# Patient Record
Sex: Male | Born: 1950 | Race: Black or African American | Hispanic: No | Marital: Single | Smoking: Never smoker
Health system: Southern US, Community
[De-identification: ages and names within clinical notes are randomized; demographics above are authoritative.]

## PROBLEM LIST (undated history)

## (undated) DIAGNOSIS — C61 Malignant neoplasm of prostate: Secondary | ICD-10-CM

## (undated) DIAGNOSIS — I10 Essential (primary) hypertension: Secondary | ICD-10-CM

## (undated) DIAGNOSIS — K219 Gastro-esophageal reflux disease without esophagitis: Secondary | ICD-10-CM

## (undated) DIAGNOSIS — E119 Type 2 diabetes mellitus without complications: Secondary | ICD-10-CM

---

## 1999-01-09 ENCOUNTER — Encounter: Payer: Self-pay | Admitting: Emergency Medicine

## 1999-01-09 ENCOUNTER — Inpatient Hospital Stay (HOSPITAL_COMMUNITY): Admission: EM | Admit: 1999-01-09 | Discharge: 1999-01-11 | Payer: Self-pay | Admitting: Emergency Medicine

## 2007-06-18 ENCOUNTER — Ambulatory Visit (HOSPITAL_COMMUNITY): Admission: RE | Admit: 2007-06-18 | Discharge: 2007-06-18 | Payer: Self-pay | Admitting: Family Medicine

## 2007-08-07 ENCOUNTER — Encounter: Admission: RE | Admit: 2007-08-07 | Discharge: 2007-08-07 | Payer: Self-pay | Admitting: Family Medicine

## 2007-08-07 ENCOUNTER — Encounter (INDEPENDENT_AMBULATORY_CARE_PROVIDER_SITE_OTHER): Payer: Self-pay | Admitting: *Deleted

## 2008-01-18 ENCOUNTER — Encounter (INDEPENDENT_AMBULATORY_CARE_PROVIDER_SITE_OTHER): Payer: Self-pay | Admitting: *Deleted

## 2008-01-18 ENCOUNTER — Encounter: Admission: RE | Admit: 2008-01-18 | Discharge: 2008-01-18 | Payer: Self-pay | Admitting: Family Medicine

## 2008-04-18 ENCOUNTER — Encounter: Admission: RE | Admit: 2008-04-18 | Discharge: 2008-04-18 | Payer: Self-pay | Admitting: Family Medicine

## 2008-04-18 ENCOUNTER — Encounter (INDEPENDENT_AMBULATORY_CARE_PROVIDER_SITE_OTHER): Payer: Self-pay | Admitting: *Deleted

## 2008-05-13 ENCOUNTER — Ambulatory Visit: Payer: Self-pay | Admitting: Internal Medicine

## 2008-05-13 DIAGNOSIS — R1084 Generalized abdominal pain: Secondary | ICD-10-CM

## 2008-05-13 DIAGNOSIS — K625 Hemorrhage of anus and rectum: Secondary | ICD-10-CM | POA: Insufficient documentation

## 2008-05-13 DIAGNOSIS — K5900 Constipation, unspecified: Secondary | ICD-10-CM | POA: Insufficient documentation

## 2008-05-14 ENCOUNTER — Encounter: Payer: Self-pay | Admitting: Internal Medicine

## 2008-05-14 ENCOUNTER — Ambulatory Visit: Payer: Self-pay | Admitting: Internal Medicine

## 2008-05-16 ENCOUNTER — Encounter: Payer: Self-pay | Admitting: Internal Medicine

## 2009-04-27 ENCOUNTER — Ambulatory Visit: Payer: Self-pay | Admitting: Internal Medicine

## 2009-04-27 DIAGNOSIS — Z860100 Personal history of colon polyps, unspecified: Secondary | ICD-10-CM | POA: Insufficient documentation

## 2009-04-29 ENCOUNTER — Ambulatory Visit: Payer: Self-pay | Admitting: Internal Medicine

## 2010-05-04 NOTE — Letter (Signed)
Summary: [facility] Instructions  [facility]  [address] [place], [place] [address]   Phone: [phone]  Fax: [phone]       Troy Johnston    July 19, 1950    MRN: [id number]        Procedure Day /Date:WEDNESDAY, 04/29/09     Arrival Time:3:00 PM     Procedure Time:4:00 PM     Location of Procedure:                    [facility] (4th Floor)                        PREPARATION FOR COLONOSCOPY WITH MOVIPREP   Starting 5 days prior to your procedure NOW do not eat nuts, seeds, popcorn, corn, beans, peas,  salads, or any raw vegetables.  Do not take any fiber supplements (e.g. Metamucil, Citrucel, and Benefiber).  THE DAY BEFORE YOUR PROCEDURE         DATE: 04/28/09  DAY: TUESDAY  1.  Drink clear liquids the entire day-NO SOLID FOOD  2.  Do not drink anything colored red or purple.  Avoid juices with pulp.  No orange juice.  3.  Drink at least 64 oz. (8 glasses) of fluid/clear liquids during the day to prevent dehydration and help the prep work efficiently.  CLEAR LIQUIDS INCLUDE: Water Jello Ice Popsicles Tea (sugar ok, no milk/cream) Powdered fruit flavored drinks Coffee (sugar ok, no milk/cream) Gatorade Juice: apple, white grape, white cranberry  Lemonade Clear bullion, consomm, broth Carbonated beverages (any kind) Strained chicken noodle soup Hard Candy                             4.  In the morning, mix first dose of MoviPrep solution:    Empty 1 Pouch A and 1 Pouch B into the disposable container    Add lukewarm drinking water to the top line of the container. Mix to dissolve    Refrigerate (mixed solution should be used within 24 hrs)  5.  Begin drinking the prep at 5:00 p.m. The MoviPrep container is divided by 4 marks.   Every 15 minutes drink the solution down to the next mark (approximately 8 oz) until the full liter is complete.   6.  Follow completed prep with 16 oz of clear liquid of your choice (Nothing red or  purple).  Continue to drink clear liquids until bedtime.  7.  Before going to bed, mix second dose of MoviPrep solution:    Empty 1 Pouch A and 1 Pouch B into the disposable container    Add lukewarm drinking water to the top line of the container. Mix to dissolve    Refrigerate  THE DAY OF YOUR PROCEDURE      DATE:04/29/09 DAY: WEDNESDAY  Beginning at 11:00 a.m. (5 hours before procedure):         1. Every 15 minutes, drink the solution down to the next mark (approx 8 oz) until the full liter is complete.  2. Follow completed prep with 16 oz. of clear liquid of your choice.    3. You may drink clear liquids until 2:00 PM (2 HOURS BEFORE PROCEDURE).   MEDICATION INSTRUCTIONS  Unless otherwise instructed, you should take regular prescription medications with a small sip of water   as early as possible the morning of your procedure.  Diabetic patients -  see separate instructions.         OTHER INSTRUCTIONS  You will need a responsible adult at least 62 years of age to accompany you and drive you home.   This person must remain in the waiting room during your procedure.  Wear loose fitting clothing that is easily removed.  Leave jewelry and other valuables at home.  However, you may wish to bring a book to read or  an iPod/MP3 player to listen to music as you wait for your procedure to start.  Remove all body piercing jewelry and leave at home.  Total time from sign-in until discharge is approximately 2-3 hours.  You should go home directly after your procedure and rest.  You can resume normal activities the  day after your procedure.  The day of your procedure you should not:   Drive   Make legal decisions   Operate machinery   Drink alcohol   Return to work  You will receive specific instructions about eating, activities and medications before you leave.    The above instructions have been reviewed and explained to me by    _______________________    I fully understand and can verbalize these instructions _____________________________ Date _________

## 2010-05-04 NOTE — Letter (Signed)
Summary: Diabetic Instructions  [facility]  [address] [place], [place] [address]   Phone: [phone]  Fax: [phone]    Troy Johnston [birth year] MRN: [id number]   X ORAL DIABETIC MEDICATION INSTRUCTIONS  The day before your procedure:   Take your diabetic pill as you do normally  The day of your procedure:   Do not take your diabetic pill    We will check your blood sugar levels during the admission process and again in Recovery before discharging you home  ________________________________________________________________________

## 2010-05-04 NOTE — Procedures (Signed)
Summary: Colonoscopy  Patient: Troy Johnston Note: All result statuses are Final unless otherwise noted.  Tests: (1) Colonoscopy (COL)   COL Colonoscopy           DONE     [facility]     [address]     [place], [place]  [address]           COLONOSCOPY PROCEDURE REPORT           PATIENT:  [name], [name]  MR#:  [id number]     BIRTHDATE:  1950-09-10, 58 yrs. old  GENDER:  male           ENDOSCOPIST:  Wilhemina Bonito. Eda Keys, MD     Referred by:  Office           PROCEDURE DATE:  04/29/2009     PROCEDURE:  Surveillance Colonoscopy     ASA CLASS:  Class II     INDICATIONS:  history of pre-cancerous (adenomatous) colon polyps     (LARGE POLYP ON ICV PIECEMEALED 05-2008)           MEDICATIONS:   Fentanyl 75 mcg IV, Versed 8 mg IV           DESCRIPTION OF PROCEDURE:   After the risks benefits and     alternatives of the procedure were thoroughly explained, informed     consent was obtained.  Digital rectal exam was performed and     revealed no abnormalities.   The LB CF-[id number] endoscope     was introduced through the anus and advanced to the cecum, which     was identified by both the appendix and ileocecal valve, without     limitations. TIME TO CECUM = 3:15 MIN.The quality of the prep was     excellent, using MoviPrep.  The instrument was then slowly     withdrawn (TIME = 12:00 MIN) as the colon was fully examined.     <<PROCEDUREIMAGES>>           FINDINGS:  No polyps or cancers were seen.  Mild diverticulosis     was found in the sigmoid colon.  This was otherwise a normal     examination of the colon. THE AREA OF PRIOR POLYPECTOMY WAS CLEAN.     Retroflexed views in the rectum revealed internal hemorrhoids.     The scope was then withdrawn from the patient and the procedure     completed.           COMPLICATIONS:  None           ENDOSCOPIC IMPRESSION:     1) No polyps or cancers     2) Mild diverticulosis in the sigmoid colon     3) Otherwise normal  examination           RECOMMENDATIONS:     1) Follow up colonoscopy in 3 years           ______________________________     Wilhemina Bonito. Eda Keys, MD           CC:  Clyda Greener, MD; The Patient           n.     eSIGNED:   Wilhemina Bonito. Eda Keys at 04/29/2009 05:10 PM           Madelaine Etienne, [id number]  Note: An exclamation mark (!) indicates a result that was not dispersed into the flowsheet. Document Creation Date: 04/29/2009 5:11 PM _______________________________________________________________________  Marland Kitchen  1) Order result status: Final Collection or observation date-time: 04/29/2009 17:02 Requested date-time:  Receipt date-time:  Reported date-time:  Referring Physician:   Ordering Physician: Fransico Setters [phone]) Specimen Source:  Source: Launa Grill Order Number: [id number] Lab site:   Appended Document: Colonoscopy    Clinical Lists Changes  Observations: Added new observation of COLONNXTDUE: 04/2012 (04/29/2009 17:20)

## 2010-05-04 NOTE — Assessment & Plan Note (Signed)
Summary: Constipation and abdominal discomfort   History of Present Illness Visit Type: Follow-up Visit Primary GI MD: Yancey Flemings MD Primary Provider: Clyda Greener, MD Requesting Provider: Clyda Greener, MD Chief Complaint: constipation, uncomfortable abdomen History of Present Illness:   60 year old African American male with diabetes, hypertension, and adenomatous colon polyps. He was evaluated in February 2010 for lower abdominal discomfort, constipation, and rectal bleeding. See that dictation for details. He subsequently underwent complete colonoscopy on May 14, 2008. He was found to have mild diverticulosis and an adenomatous colon polyp on the right posterior portion of the ileocecal valve that was removed piecemeal. Other lesions biopsied or removed when non-adenomatous. He was prescribed MiraLax for constipation and asked to followup in the office in 4-6 weeks. He failed a followup as requested. Today he is quite concerned and worried about ongoing problems with constipation and abdominal bloating discomfort.Marland Kitchen He moves his bowels about once every other day. He also complains of back pain. He tells me that he has been undergoing therapeutic enema sessions which allow him to move his bowels. With these enemas he notices his stools are dark. No blood. His weight has been stable. He tells me that his diabetes has been under fair control. His other chronic medical problems are stable.   GI Review of Systems    Reports abdominal pain and  bloating.     Location of  Abdominal pain: lower abdomen.    Denies acid reflux, belching, chest pain, dysphagia with liquids, dysphagia with solids, heartburn, loss of appetite, nausea, vomiting, vomiting blood, weight loss, and  weight gain.      Reports constipation and  diverticulosis.     Denies anal fissure, black tarry stools, change in bowel habit, diarrhea, fecal incontinence, heme positive stool, hemorrhoids, irritable bowel syndrome, jaundice,  light color stool, liver problems, rectal bleeding, and  rectal pain.    Current Medications (verified): 1)  Diovan 160 Mg Tabs (Valsartan) .... Once Daily 2)  Glipizide 5 Mg Tabs (Glipizide) .... Two Times A Day 3)  Hydrocodone-Acetaminophen 7.5-750 Mg Tabs (Hydrocodone-Acetaminophen) .... As Needed 4)  Metformin Hcl 500 Mg Tabs (Metformin Hcl) .... Two Times A Day 5)  Amlodipine Besylate 10 Mg Tabs (Amlodipine Besylate) .... Once Daily 6)  Clonidine Hcl 0.2 Mg Tabs (Clonidine Hcl) .... Once Daily  Allergies (verified): 1)  ! Penicillin  Past History:  Past Medical History: Reviewed history from 05/13/2008 and no changes required. Diabetes Hypertension  Past Surgical History: Reviewed history from 05/13/2008 and no changes required. Unremarkable  Family History: Reviewed history from 05/13/2008 and no changes required. Family History of Diabetes: Mother, Father, Siblings Family History of Heart Disease: Mother  Social History: Reviewed history from 05/13/2008 and no changes required. Occupation: Transportation System Patient has never smoked.  Alcohol Use - no Illicit Drug Use - no Patient gets regular exercise.  Review of Systems       back pain, anxiety, depression, hearing problems, muscle cramps, insomnia, palpitations. All other systems reviewed and reported as negative  Vital Signs:  Patient profile:   60 year old male Height:      67 inches Weight:      233.50 pounds BMI:     36.70 Pulse rate:   80 / minute Pulse rhythm:   regular BP sitting:   170 / 92  (left arm) Cuff size:   large  Vitals Entered By: June McMurray CMA Duncan Dull) (April 27, 2009 9:46 AM)  Physical Exam  General:  Well developed, well  nourished, no acute distress. Head:  Normocephalic and atraumatic. Eyes:  PERRLA, no icterus. Nose:  No deformity, discharge,  or lesions. Mouth:  No deformity or lesions, Neck:  Supple; no masses or thyromegaly. Lungs:  Clear throughout to  auscultation. Heart:  Regular rate and rhythm; no murmurs, rubs,  or bruits. Abdomen:  Soft, obese,nontender and nondistended. No masses, hepatosplenomegaly or hernias noted. Normal bowel sounds. Rectal:  deferred Msk:  Symmetrical with no gross deformities. Normal posture. Pulses:  Normal pulses noted. Extremities:  No clubbing, cyanosis, edema or deformities noted. Neurologic:  Alert and  oriented x4;  grossly normal neurologically. Skin:  Intact without significant lesions or rashes. Psych:  Alert and cooperative. anxious.   Impression & Recommendations:  Problem # 1:  CONSTIPATION (ICD-564.00) chronic functional constipation.. I would not recommend ongoing hydraulic enema sessions  Plan: #1. GlycoLax 17 g in 8 ounces of water. Titrate to need to achieve one bowel movement daily  Problem # 2:  PERSONAL HX COLONIC POLYPS (ICD-V12.72) history of adenomatous colon polyp removed piecemeal. Due for followup.  Plan: #1. Colonoscopy. The nature procedure as well as the risks, benefits, and alternatives were reviewed. He understood and agreed to proceed. #2. Movi prep prescribed. The patient instructed on its use.  Problem # 3:  ABDOMINAL PAIN-GENERALIZED (ICD-789.07) chronic abdominal discomfort with bloating related to constipation. We will try to regulate bowels to see if this affords relief.  Other Orders: Colonoscopy (Colon)  Patient Instructions: 1)  Colonoscopy [facility] 04/29/09 4:00 pm 2)  Movi prep instructions given to patient. 3)  Movi prep Rx. sent to pharmacy. 4)  Hold oral diabetic meds morning of procedure. 5)  The medication list was reviewed and reconciled.  All changed / newly prescribed medications were explained.  A complete medication list was provided to the patient / caregiver. 6)  Copy: Dr. Leanor Kail Prescriptions: MOVIPREP 100 GM  SOLR (PEG-KCL-NACL-NASULF-NA ASC-C) As per prep instructions.  #1 x 0   Entered by:   Milford Cage NCMA   Authorized by:   Hilarie Fredrickson MD   Signed by:   Milford Cage NCMA on 04/27/2009   Method used:   Electronically to        [facility] [phone]* (retail)       [address]       [place], [place]  [address]       Ph: [phone]       Fax: [phone]   RxID:   [id number]   Appended Document: Constipation and abdominal discomfort ADDENDUM: Additional problem of diabetes to be added to the problem list above.. Diabetic medications need to be managed for his procedure. He was instructed to hold diabetic medications the day of the procedure in order to avoid unwanted hypoglycemia. We will monitor his blood sugar medially before and after the procedure. He can resume his diabetes medications after the procedure when he is resuming normal  oral intake

## 2010-07-20 LAB — GLUCOSE, CAPILLARY
Glucose-Capillary: 115 mg/dL — ABNORMAL HIGH (ref 70–99)
Glucose-Capillary: 123 mg/dL — ABNORMAL HIGH (ref 70–99)

## 2010-08-20 NOTE — Discharge Summary (Signed)
. [facility]  Patient:    Troy Johnston                      MRN: [id number] Adm. Date:  [id number] Disc. Date: 01/11/99 Attending:  Colon Branch Dictator:   Leonides Cave, P.A. CC:         Noralyn Pick. Eden Emms, M.D. [facility]                           Discharge Summary  DATE OF BIRTH:  [birth year].  ADMIT DIAGNOSIS:  Chest pain, likely to be due to pericarditis.  Patient had a cardiac catheterization which is reportedly revealing normal coronary anatomy, though that cath report is not available at this time of discharge.  Please see  Dr. Ricki Miller dictated report for full details.  Patient had a negative spiral CT  and a normal 2-D echo.  BRIEF HISTORY:  Patient is a 60 year old African-American male who presented to the Emergency Department with a chest pain that started earlier on the morning of January 09, 1999.  Patient states that it initially began at rest.  Patient denies any nausea, vomiting, or diaphoresis.  Patient has noticed some increased shortness of breath throughout the day.  Patient works as a Naval architect.  PAST MEDICAL HISTORY:  Patient has a very benign past medical history and said e has never visited a doctor in the past.  FAMILY HISTORY:  Patient does have a sister who has had coronary artery bypass graft surgery.  SOCIAL HISTORY:  Patient does not smoke, does not have a history of hypertension or hypercholesterolemia.  LABORATORY:  On EKG in the Emergency Department, patient did have positive ST elevations in lead I, avL, and V2-V4, and T-wave inversions in leads III and avF.  HOSPITAL COURSE:  With this, patient was admitted to rule out MI and started on  heparin, IV Integrilin, and nitroglycerin as well as morphine.  Initial cardiac  enzyme CKs were positive, 844 and 731 respectively, although CK-MBs were negative x 2.  Patient was taken to the cath lab on January 11, 1999.  Please see  the dictated report for full details.  Patient also had a spiral CT to rule out PE, which was negative.  Patient had a 2-D echo done on January 11, 1999 before going home, which revealed normal wall motion and normal ejection fraction.  Patient was treated ith Indocin.  It was felt he was stable for discharge on the evening of October 9, 000 on the following medications:  DISCHARGE MEDICATIONS: 1. Coated aspirin q.d. 2. Motrin 600 mg t.i.d. for four days.  ACTIVITY:  Patient was told that he could return to work on Thursday, October 12. He was told to undergo no heavy lifting, driving, or sexual activity for two days.  DIET:  He was told to adhere to a low fat, low cholesterol diet.  FOLLOW-UP:  He was told if there was bleeding or swelling at the cath site to call [facility] immediately.  Patient was told to follow up with the [facility]  primary care team in 4-6 weeks and to call this week to make that appointment. A number was provided.  LABORATORY:  January 09, 1999, patients sodium was 136, potassium 3.4, chloride 102, CO2 30, glucose 103, BUN 15, creatinine 1.2, INR 1.0.  White count 8.3, hemoglobin 12.6, hematocrit 38.9, platelet count 270.  Of note patient also had  an ESR and ANA, which were perfectly normal.  As mentioned above, patient will follow up with the [facility], as he has no primary care physician. DD:  01/11/99 TD:  01/11/99 Job: [id number]

## 2010-11-26 ENCOUNTER — Other Ambulatory Visit: Payer: Self-pay | Admitting: Gastroenterology

## 2010-12-27 LAB — COMPREHENSIVE METABOLIC PANEL
ALT: 18
AST: 24
Albumin: 3.6
Chloride: 103
Creatinine, Ser: 0.99
GFR calc Af Amer: >60
Potassium: 3.4 — ABNORMAL LOW
Sodium: 139
Total Bilirubin: 0.8

## 2010-12-27 LAB — PSA: PSA: 2.07

## 2010-12-27 LAB — LIPID PANEL
Triglycerides: 74
VLDL: 15

## 2010-12-27 LAB — TSH: TSH: 3.052

## 2010-12-27 LAB — CBC
Platelets: 270
RBC: 4.71
WBC: 6.2

## 2012-05-02 ENCOUNTER — Encounter: Payer: Self-pay | Admitting: Internal Medicine

## 2012-10-30 ENCOUNTER — Encounter: Payer: Self-pay | Admitting: Internal Medicine

## 2013-10-14 ENCOUNTER — Emergency Department (HOSPITAL_COMMUNITY): Payer: Non-veteran care

## 2013-10-14 ENCOUNTER — Emergency Department (HOSPITAL_COMMUNITY)
Admission: EM | Admit: 2013-10-14 | Discharge: 2013-10-14 | Disposition: A | Discharge status: Home / Self Care | Payer: Non-veteran care | Attending: Emergency Medicine | Admitting: Emergency Medicine

## 2013-10-14 ENCOUNTER — Encounter (HOSPITAL_COMMUNITY): Payer: Self-pay | Admitting: Emergency Medicine

## 2013-10-14 DIAGNOSIS — Z8546 Personal history of malignant neoplasm of prostate: Secondary | ICD-10-CM | POA: Insufficient documentation

## 2013-10-14 DIAGNOSIS — N501 Vascular disorders of male genital organs: Secondary | ICD-10-CM | POA: Insufficient documentation

## 2013-10-14 DIAGNOSIS — Z880 Allergy status to penicillin: Secondary | ICD-10-CM | POA: Insufficient documentation

## 2013-10-14 DIAGNOSIS — R109 Unspecified abdominal pain: Secondary | ICD-10-CM

## 2013-10-14 DIAGNOSIS — E119 Type 2 diabetes mellitus without complications: Secondary | ICD-10-CM | POA: Insufficient documentation

## 2013-10-14 DIAGNOSIS — Z8719 Personal history of other diseases of the digestive system: Secondary | ICD-10-CM | POA: Insufficient documentation

## 2013-10-14 DIAGNOSIS — I10 Essential (primary) hypertension: Secondary | ICD-10-CM | POA: Insufficient documentation

## 2013-10-14 HISTORY — DX: Malignant neoplasm of prostate: C61

## 2013-10-14 HISTORY — DX: Type 2 diabetes mellitus without complications: E11.9

## 2013-10-14 HISTORY — DX: Gastro-esophageal reflux disease without esophagitis: K21.9

## 2013-10-14 HISTORY — DX: Essential (primary) hypertension: I10

## 2013-10-14 LAB — BASIC METABOLIC PANEL
ANION GAP: 16 — AB (ref 5–15)
BUN: 23 mg/dL (ref 6–23)
CALCIUM: 9.6 mg/dL (ref 8.4–10.5)
CO2: 20 mEq/L (ref 19–32)
Chloride: 105 mEq/L (ref 96–112)
Creatinine, Ser: 1.72 mg/dL — ABNORMAL HIGH (ref 0.50–1.35)
GFR, EST AFRICAN AMERICAN: 47 mL/min — AB (ref >90)
GFR, EST NON AFRICAN AMERICAN: 40 mL/min — AB (ref >90)
GLUCOSE: 70 mg/dL (ref 70–99)
POTASSIUM: 4 meq/L (ref 3.7–5.3)
SODIUM: 141 meq/L (ref 137–147)

## 2013-10-14 LAB — CBC
HCT: 31.9 % — ABNORMAL LOW (ref 39.0–52.0)
HEMOGLOBIN: 10.2 g/dL — AB (ref 13.0–17.0)
MCH: 26.8 pg (ref 26.0–34.0)
MCHC: 32 g/dL (ref 30.0–36.0)
MCV: 83.7 fL (ref 78.0–100.0)
PLATELETS: 276 10*3/uL (ref 150–400)
RBC: 3.81 MIL/uL — ABNORMAL LOW (ref 4.22–5.81)
RDW: 13.5 % (ref 11.5–15.5)
WBC: 5.6 10*3/uL (ref 4.0–10.5)

## 2013-10-14 LAB — URINE MICROSCOPIC-ADD ON

## 2013-10-14 LAB — URINALYSIS, ROUTINE W REFLEX MICROSCOPIC
Bilirubin Urine: NEGATIVE
GLUCOSE, UA: NEGATIVE mg/dL
KETONES UR: NEGATIVE mg/dL
LEUKOCYTES UA: NEGATIVE
Nitrite: NEGATIVE
PH: 5 (ref 5.0–8.0)
Protein, ur: 30 mg/dL — AB
SPECIFIC GRAVITY, URINE: 1.017 (ref 1.005–1.030)
Urobilinogen, UA: 0.2 mg/dL (ref 0.0–1.0)

## 2013-10-14 MED ORDER — IOHEXOL 300 MG/ML  SOLN: 50.0000 mL | Freq: Once | INTRAMUSCULAR | Status: DC | PRN

## 2013-10-14 NOTE — ED Notes (Addendum)
Patient states he has gross amounts of blood after intercourse. He denies pain after these episodes and that it just stops on its own. Patient is seeing a urologist in [place] at the [facility] hospital. Last visit was 3 weeks ago.

## 2013-10-14 NOTE — ED Notes (Signed)
Pt has been dx with prostate cancer in early June.  Pt has had bleeding from penis.  Pt told originally related to biopsy and now related to sexual intercourse.  Pt now having difficulty with holding his bladder.  Pt states pain in rt flank area.

## 2013-10-15 NOTE — ED Provider Notes (Signed)
CSN: [id number]     Arrival date & time 10/14/13  1340 History   First MD Initiated Contact with Patient 10/14/13 1651     Chief Complaint  Patient presents with  . penile bleeding   . Flank Pain    HPI Patient states she has history of prostate cancer and underwent prostatic biopsy approximately one month ago.  He states his been urinating fine but developed some new suprapubic abdominal pain with some radiation towards his right flank.  He also reports 2 new episodes of blood in his ejaculate.  He denies penile pain.  No testicular pain.  Denies nausea vomiting.  Fevers or chills.  No dysuria or urinary frequency.  Symptoms are mild.   Past Medical History  Diagnosis Date  . Prostate cancer   . Diabetes mellitus without complication   . Hypertension   . GERD (gastroesophageal reflux disease)    History reviewed. No pertinent past surgical history. History reviewed. No pertinent family history. History  Substance Use Topics  . Smoking status: Never Smoker   . Smokeless tobacco: Not on file  . Alcohol Use: No    Review of Systems  All other systems reviewed and are negative.     Allergies  Penicillins  Home Medications   Prior to Admission medications   Not on File   BP 168/78  Pulse 58  Temp(Src) 98.2 F (36.8 C) (Oral)  Resp 16  SpO2 100% Physical Exam  Nursing note and vitals reviewed. Constitutional: He is oriented to person, place, and time. He appears well-developed and well-nourished.  HENT:  Head: Normocephalic and atraumatic.  Eyes: EOM are normal.  Neck: Normal range of motion.  Cardiovascular: Normal rate, regular rhythm, normal heart sounds and intact distal pulses.   Pulmonary/Chest: Effort normal and breath sounds normal. No respiratory distress.  Abdominal: Soft. He exhibits no distension. There is no tenderness.  Musculoskeletal: Normal range of motion.  Neurological: He is alert and oriented to person, place, and time.  Skin: Skin is warm  and dry.  Psychiatric: He has a normal mood and affect. Judgment normal.    ED Course  Procedures (including critical care time) Labs Review Labs Reviewed  URINALYSIS, ROUTINE W REFLEX MICROSCOPIC - Abnormal; Notable for the following:    Hgb urine dipstick TRACE (*)    Protein, ur 30 (*)    All other components within normal limits  URINE MICROSCOPIC-ADD ON - Abnormal; Notable for the following:    Casts HYALINE CASTS (*)    All other components within normal limits  CBC - Abnormal; Notable for the following:    RBC 3.81 (*)    Hemoglobin 10.2 (*)    HCT 31.9 (*)    All other components within normal limits  BASIC METABOLIC PANEL - Abnormal; Notable for the following:    Creatinine, Ser 1.72 (*)    GFR calc non Af Amer 40 (*)    GFR calc Af Amer 47 (*)    Anion gap 16 (*)    All other components within normal limits    Imaging Review Ct Abdomen Pelvis Wo Contrast  10/14/2013   CLINICAL DATA:  Right flank pain. Recent diagnosis of prostate cancer.  EXAM: CT ABDOMEN AND PELVIS WITHOUT CONTRAST  TECHNIQUE: Multidetector CT imaging of the abdomen and pelvis was performed following the standard protocol without IV contrast.  COMPARISON:  Prior CTs 04/18/2008 and 01/18/2008.  FINDINGS: The lung bases are clear. There is no pleural or pericardial effusion.  There is a 2 mm calculus in the lower pole of the left kidney on image 41. There is also tiny calculus in the lower pole of the right kidney, best seen on coronal image number 100. There is no evidence of hydronephrosis, ureteral or bladder calculus. Low-density lesion posteriorly in the mid left kidney has enlarged compared with the prior study, measuring 1.7 cm on image 37. No focal left renal abnormalities are seen.  The liver, gallbladder, pancreas, spleen and adrenal glands appear unremarkable.  The stomach, small bowel, appendix and colon demonstrate no acute findings. There is mild aortoiliac atherosclerosis. No enlarged abdominal  pelvic lymph nodes are seen. The prostate gland has a stable appearance without obvious mass. Asymmetric density within the right inguinal canal is similar to the prior study and may reflect a hydrocele or retraction of the right testis.  There are no acute or worrisome osseous findings.  IMPRESSION: 1. Nonobstructing bilateral renal calculi. No evidence of ureteral calculus or hydronephrosis. 2. Interval enlargement of low-density right renal lesion, likely a cyst. 3. No evidence of metastatic prostate cancer. 4. Nonspecific soft tissue prominence in the right inguinal canal, similar to prior studies.   Electronically Signed   By: Camie Patience M.D.   On: 10/14/2013 18:57     EKG Interpretation None      MDM   Final diagnoses:  Flank pain    Patient continues to feel better this time.  CT scan and urinalysis without significant abnormalities.  Patient be referred back to his urologist at the [facility] hospital.    Hoy Morn, MD 10/15/13 [phone]

## 2014-08-14 ENCOUNTER — Encounter: Payer: Self-pay | Admitting: Internal Medicine

## 2017-10-31 ENCOUNTER — Emergency Department (HOSPITAL_COMMUNITY): Payer: Medicare HMO

## 2017-10-31 ENCOUNTER — Emergency Department (HOSPITAL_BASED_OUTPATIENT_CLINIC_OR_DEPARTMENT_OTHER): Payer: Medicare HMO

## 2017-10-31 ENCOUNTER — Emergency Department (HOSPITAL_COMMUNITY)
Admission: EM | Admit: 2017-10-31 | Discharge: 2017-10-31 | Disposition: A | Discharge status: Home / Self Care | Payer: Medicare HMO | Attending: Emergency Medicine | Admitting: Emergency Medicine

## 2017-10-31 ENCOUNTER — Other Ambulatory Visit: Payer: Self-pay

## 2017-10-31 ENCOUNTER — Encounter (HOSPITAL_COMMUNITY): Payer: Self-pay

## 2017-10-31 DIAGNOSIS — M25572 Pain in left ankle and joints of left foot: Secondary | ICD-10-CM | POA: Diagnosis not present

## 2017-10-31 DIAGNOSIS — Z7984 Long term (current) use of oral hypoglycemic drugs: Secondary | ICD-10-CM | POA: Diagnosis not present

## 2017-10-31 DIAGNOSIS — Z8546 Personal history of malignant neoplasm of prostate: Secondary | ICD-10-CM | POA: Insufficient documentation

## 2017-10-31 DIAGNOSIS — E119 Type 2 diabetes mellitus without complications: Secondary | ICD-10-CM | POA: Insufficient documentation

## 2017-10-31 DIAGNOSIS — I10 Essential (primary) hypertension: Secondary | ICD-10-CM | POA: Insufficient documentation

## 2017-10-31 DIAGNOSIS — Z7982 Long term (current) use of aspirin: Secondary | ICD-10-CM | POA: Diagnosis not present

## 2017-10-31 DIAGNOSIS — M25571 Pain in right ankle and joints of right foot: Secondary | ICD-10-CM | POA: Diagnosis not present

## 2017-10-31 DIAGNOSIS — Z79899 Other long term (current) drug therapy: Secondary | ICD-10-CM | POA: Insufficient documentation

## 2017-10-31 DIAGNOSIS — M79609 Pain in unspecified limb: Secondary | ICD-10-CM | POA: Diagnosis not present

## 2017-10-31 DIAGNOSIS — M79662 Pain in left lower leg: Secondary | ICD-10-CM | POA: Diagnosis present

## 2017-10-31 LAB — CBC WITH DIFFERENTIAL/PLATELET
BASOS ABS: 0 10*3/uL (ref 0.0–0.1)
BASOS PCT: 0 %
EOS ABS: 0.1 10*3/uL (ref 0.0–0.7)
Eosinophils Relative: 3 %
HEMATOCRIT: 29.7 % — AB (ref 39.0–52.0)
Hemoglobin: 9.6 g/dL — ABNORMAL LOW (ref 13.0–17.0)
Lymphocytes Relative: 41 %
Lymphs Abs: 2.2 10*3/uL (ref 0.7–4.0)
MCH: 27.8 pg (ref 26.0–34.0)
MCHC: 32.3 g/dL (ref 30.0–36.0)
MCV: 86.1 fL (ref 78.0–100.0)
MONO ABS: 0.3 10*3/uL (ref 0.1–1.0)
Monocytes Relative: 6 %
NEUTROS ABS: 2.8 10*3/uL (ref 1.7–7.7)
NEUTROS PCT: 50 %
PLATELETS: 217 10*3/uL (ref 150–400)
RBC: 3.45 MIL/uL — ABNORMAL LOW (ref 4.22–5.81)
RDW: 13.1 % (ref 11.5–15.5)
WBC: 5.5 10*3/uL (ref 4.0–10.5)

## 2017-10-31 LAB — BASIC METABOLIC PANEL
ANION GAP: 5 (ref 5–15)
BUN: 38 mg/dL — ABNORMAL HIGH (ref 8–23)
CALCIUM: 9.5 mg/dL (ref 8.9–10.3)
CO2: 24 mmol/L (ref 22–32)
CREATININE: 2.26 mg/dL — AB (ref 0.61–1.24)
Chloride: 113 mmol/L — ABNORMAL HIGH (ref 98–111)
GFR calc Af Amer: 33 mL/min — ABNORMAL LOW (ref >60)
GFR, EST NON AFRICAN AMERICAN: 28 mL/min — AB (ref >60)
GLUCOSE: 117 mg/dL — AB (ref 70–99)
Potassium: 3.9 mmol/L (ref 3.5–5.1)
SODIUM: 142 mmol/L (ref 135–145)

## 2017-10-31 MED ORDER — KETOROLAC TROMETHAMINE 30 MG/ML IJ SOLN
15.0000 mg | Freq: Once | INTRAMUSCULAR | Status: AC
  Administered 2017-10-31: 15 mg via INTRAVENOUS
  Filled 2017-10-31: qty 1

## 2017-10-31 MED ORDER — HYDROCODONE-ACETAMINOPHEN 5-325 MG PO TABS: 2.0000 | ORAL_TABLET | ORAL | 0 refills | Status: DC | PRN

## 2017-10-31 NOTE — ED Provider Notes (Signed)
[facility] DEPT Provider Note   CSN: [id number] Arrival date & time: 10/31/17  7673     History   Chief Complaint Chief Complaint  Patient presents with  . Leg Pain    HPI Troy Johnston is a 67 y.o. male.  67 year old male with prior history of tubal presents with complaint of bilateral ankle pain.  Patient reports that his right and left ankles are achy and painful.  Symptoms started yesterday.  Patient denies any specific injury.  He did not take anything for his symptoms at home.  He denies associated fever, weakness, nausea, vomiting, chest pain, shortness of breath, or other complaint.  He reports achy pain that "burns" in both ankles. He is ambulatory.   The history is provided by the patient.  Ankle Pain   The incident occurred yesterday. The incident occurred at home. There was no injury mechanism. The pain is present in the left ankle and right ankle. The quality of the pain is described as aching. The pain is mild. The pain has been constant since onset. Pertinent negatives include no numbness, no inability to bear weight, no loss of motion, no muscle weakness, no loss of sensation and no tingling. He reports no foreign bodies present. Nothing aggravates the symptoms. He has tried nothing for the symptoms.    Past Medical History:  Diagnosis Date  . Diabetes mellitus without complication ([place])   . GERD (gastroesophageal reflux disease)   . Hypertension   . Prostate cancer [facility])     Patient Active Problem List   Diagnosis Date Noted  . PERSONAL HX COLONIC POLYPS 04/27/2009  . CONSTIPATION 05/13/2008  . RECTAL BLEEDING 05/13/2008  . ABDOMINAL PAIN-GENERALIZED 05/13/2008    History reviewed. No pertinent surgical history.      Home Medications    Prior to Admission medications   Medication Sig Start Date End Date Taking? Authorizing Provider  amLODipine (NORVASC) 10 MG tablet Take 10 mg by mouth daily.   Yes [provider]  aspirin EC 81 MG tablet Take 81 mg by mouth daily.   Yes [provider]  atorvastatin (LIPITOR) 20 MG tablet Take 20 mg by mouth at bedtime.   Yes [provider]  doxazosin (CARDURA) 8 MG tablet Take 4 mg by mouth at bedtime.   Yes [provider]  glipiZIDE (GLUCOTROL) 10 MG tablet Take 10 mg by mouth 2 (two) times daily before a meal.   Yes [provider]  hydrALAZINE (APRESOLINE) 100 MG tablet Take 100 mg by mouth every 8 (eight) hours as needed.   Yes [provider]  ibuprofen (ADVIL,MOTRIN) 200 MG tablet Take 400 mg by mouth daily as needed for moderate pain.   Yes [provider]  IRON PO Take 1 tablet by mouth daily.   Yes [provider]  lisinopril (PRINIVIL,ZESTRIL) 40 MG tablet Take 40 mg by mouth daily.   Yes [provider]  metFORMIN (GLUCOPHAGE-XR) 500 MG 24 hr tablet Take 500 mg by mouth daily with breakfast.   Yes [provider]  polyethylene glycol (MIRALAX / GLYCOLAX) packet Take 17 g by mouth daily as needed for mild constipation.   Yes [provider]  PSYLLIUM PO 1 Tablespoon twice Daily as needed for regularity   Yes [provider]  vardenafil (LEVITRA) 20 MG tablet Take 20 mg by mouth daily as needed for erectile dysfunction.   Yes [provider]  HYDROcodone-acetaminophen (NORCO/VICODIN) 5-325 MG tablet Take 2  tablets by mouth every 4 (four) hours as needed. 10/31/17   Valarie Merino, MD    Family History History reviewed. No pertinent family history.  Social History Social History   Tobacco Use  . Smoking status: Never Smoker  Substance Use Topics  . Alcohol use: No  . Drug use: No     Allergies   Penicillins   Review of Systems Review of Systems  Neurological: Negative for tingling and numbness.  All other systems reviewed and are negative.    Physical Exam Updated Vital Signs BP (!) 151/65 (BP Location: Left Arm)    Pulse 76   Temp 98 F (36.7 C) (Oral)   Resp 18   Ht 5\' 7"  (1.702 m)   Wt 95.3 kg (210 lb)   SpO2 100%   BMI 32.89 kg/m   Physical Exam  Constitutional: He is oriented to person, place, and time. He appears well-developed and well-nourished. No distress.  HENT:  Head: Normocephalic and atraumatic.  Mouth/Throat: Oropharynx is clear and moist.  Eyes: Pupils are equal, round, and reactive to light. Conjunctivae and EOM are normal.  Neck: Normal range of motion. Neck supple.  Cardiovascular: Normal rate, regular rhythm and normal heart sounds.  Pulmonary/Chest: Effort normal and breath sounds normal. No respiratory distress.  Abdominal: Soft. He exhibits no distension. There is no tenderness.  Musculoskeletal: Normal range of motion. He exhibits no edema or deformity.  Mild tenderness noted diffusely to bilateral ankles.  No step-off or deformity noted.  No erythema noted.  Distal bilateral lower extremities are neurovascular intact.  Patient is ambulatory.   Neurological: He is alert and oriented to person, place, and time.  Skin: Skin is warm and dry.  Psychiatric: He has a normal mood and affect.  Nursing note and vitals reviewed.    ED Treatments / Results  Labs (all labs ordered are listed, but only abnormal results are displayed) Labs Reviewed  BASIC METABOLIC PANEL - Abnormal; Notable for the following components:      Result Value   Chloride 113 (*)    Glucose, Bld 117 (*)    BUN 38 (*)    Creatinine, Ser 2.26 (*)    GFR calc non Af Amer 28 (*)    GFR calc Af Amer 33 (*)    All other components within normal limits  CBC WITH DIFFERENTIAL/PLATELET - Abnormal; Notable for the following components:   RBC 3.45 (*)    Hemoglobin 9.6 (*)    HCT 29.7 (*)    All other components within normal limits    EKG None  Radiology Dg Ankle Complete Left  Result Date: 10/31/2017 CLINICAL DATA:  Burning sensation in both ankles. No new injury. Remote left ankle fracture.  EXAM: LEFT ANKLE COMPLETE - 3+ VIEW COMPARISON:  None. FINDINGS: Evidence of old healed distal tibia and fibular fractures. Associated advanced osteoarthritic changes within the left ankle with near complete joint space loss. No acute fracture. Vascular calcifications noted. 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 No acute bony abnormality. Electronically Signed   By: Rolm Baptise M.D.   On: 10/31/2017 11:19   Dg Ankle Complete Right  Result Date: 10/31/2017 CLINICAL DATA:  Burning sensation in both ankles.  No injury EXAM: RIGHT ANKLE - COMPLETE 3+ VIEW COMPARISON:  None. FINDINGS: No acute bony abnormality. Specifically, no fracture, subluxation, or dislocation. Vascular calcifications noted. Joint spaces maintained. IMPRESSION: No acute bony abnormality. Electronically Signed   By: Lennette Bihari  [place] M.D.   On: 10/31/2017 11:19    Procedures Procedures (including critical care time)  Medications Ordered in ED Medications  ketorolac (TORADOL) 30 MG/ML injection 15 mg (15 mg Intravenous Given 10/31/17 1139)     Initial Impression / Assessment and Plan / ED Course  I have reviewed the triage vital signs and the nursing notes.  Pertinent labs & imaging results that were available during my care of the patient were reviewed by me and considered in my medical decision making (see chart for details).     MDM  Screen complete  Patient is presenting for evaluation of his bilateral ankle pain.  X-rays do not reveal any significant traumatic injury.  Ultrasound does not show a DVT.  Screening labs are otherwise without significant abnormality except for mildly elevated creatinine.  Patient reports a long-standing history of renal insufficiency that is followed by the [place].  Patient is unsure what his baseline Cr is.  Patient is advised to follow-up closely with the [facility] clinic in [place] for further work-up and evaluation of his kidney  function.  Feels improved following his ED evaluation and treatment.  He desires discharge home.  Close follow-up is advised.  Strict return precautions are given and understood.   Final Clinical Impressions(s) / ED Diagnoses   Final diagnoses:  Acute bilateral ankle pain    ED Discharge Orders        Ordered    HYDROcodone-acetaminophen (NORCO/VICODIN) 5-325 MG tablet  Every 4 hours PRN     10/31/17 1358       Valarie Merino, MD 10/31/17 [phone]

## 2017-10-31 NOTE — Progress Notes (Signed)
*  Preliminary Results* Bilateral lower extremity venous duplex completed. Bilateral lower extremities are negative for deep vein thrombosis. There is no evidence of Baker's cyst bilaterally.  10/31/2017 1:48 PM Mujtaba Bollig, BS, RVT, RDCS

## 2017-10-31 NOTE — ED Triage Notes (Addendum)
Pt presents with c/o bilateral leg pain that started this morning. Pt reports the pain started in his lower legs and has now progressed up to his groin. Pt was having some trouble ambulating in the lobby. Pt reports he recently bought a cane because he was having some trouble ambulating but today the pain is in both legs, as opposed to just one. Pt is also very shaky, reports that his head is hurting, also reporting that he has not had anything to eat this morning.

## 2017-10-31 NOTE — Discharge Instructions (Addendum)
Please return for any problem. Follow up with your regular care providers at the [facility]. Your creatinine today is elevated (2.26) with a BUN of 38. Your kidney function requires a recheck within the next week.

## 2019-11-13 ENCOUNTER — Emergency Department (HOSPITAL_COMMUNITY)
Admission: EM | Admit: 2019-11-13 | Discharge: 2019-11-14 | Disposition: A | Discharge status: Home / Self Care | Payer: No Typology Code available for payment source | Attending: Emergency Medicine | Admitting: Emergency Medicine

## 2019-11-13 ENCOUNTER — Emergency Department (HOSPITAL_COMMUNITY): Payer: No Typology Code available for payment source

## 2019-11-13 ENCOUNTER — Encounter (HOSPITAL_COMMUNITY): Payer: Self-pay | Admitting: *Deleted

## 2019-11-13 DIAGNOSIS — M545 Low back pain: Secondary | ICD-10-CM | POA: Insufficient documentation

## 2019-11-13 DIAGNOSIS — Z7984 Long term (current) use of oral hypoglycemic drugs: Secondary | ICD-10-CM | POA: Insufficient documentation

## 2019-11-13 DIAGNOSIS — R1030 Lower abdominal pain, unspecified: Secondary | ICD-10-CM | POA: Insufficient documentation

## 2019-11-13 DIAGNOSIS — Y9389 Activity, other specified: Secondary | ICD-10-CM | POA: Insufficient documentation

## 2019-11-13 DIAGNOSIS — M542 Cervicalgia: Secondary | ICD-10-CM | POA: Diagnosis present

## 2019-11-13 DIAGNOSIS — Y9289 Other specified places as the place of occurrence of the external cause: Secondary | ICD-10-CM | POA: Insufficient documentation

## 2019-11-13 DIAGNOSIS — Y998 Other external cause status: Secondary | ICD-10-CM | POA: Diagnosis not present

## 2019-11-13 DIAGNOSIS — E119 Type 2 diabetes mellitus without complications: Secondary | ICD-10-CM | POA: Insufficient documentation

## 2019-11-13 DIAGNOSIS — S161XXA Strain of muscle, fascia and tendon at neck level, initial encounter: Secondary | ICD-10-CM | POA: Diagnosis not present

## 2019-11-13 DIAGNOSIS — Z79899 Other long term (current) drug therapy: Secondary | ICD-10-CM | POA: Insufficient documentation

## 2019-11-13 DIAGNOSIS — Z7982 Long term (current) use of aspirin: Secondary | ICD-10-CM | POA: Insufficient documentation

## 2019-11-13 DIAGNOSIS — I10 Essential (primary) hypertension: Secondary | ICD-10-CM | POA: Insufficient documentation

## 2019-11-13 MED ORDER — ACETAMINOPHEN 325 MG PO TABS: 650.0000 mg | ORAL_TABLET | Freq: Once | ORAL | Status: DC
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
## 2019-11-14 ENCOUNTER — Emergency Department (HOSPITAL_COMMUNITY): Payer: No Typology Code available for payment source

## 2019-11-14 LAB — BASIC METABOLIC PANEL
Anion gap: 10 (ref 5–15)
BUN: 35 mg/dL — ABNORMAL HIGH (ref 8–23)
CO2: 22 mmol/L (ref 22–32)
Calcium: 9.1 mg/dL (ref 8.9–10.3)
Chloride: 109 mmol/L (ref 98–111)
Creatinine, Ser: 2.43 mg/dL — ABNORMAL HIGH (ref 0.61–1.24)
GFR calc Af Amer: 30 mL/min — ABNORMAL LOW (ref >60)
GFR calc non Af Amer: 26 mL/min — ABNORMAL LOW (ref >60)
Glucose, Bld: 150 mg/dL — ABNORMAL HIGH (ref 70–99)
Potassium: 4.6 mmol/L (ref 3.5–5.1)
Sodium: 141 mmol/L (ref 135–145)

## 2019-11-14 MED ORDER — METHOCARBAMOL 500 MG PO TABS: 500.0000 mg | ORAL_TABLET | Freq: Two times a day (BID) | ORAL | 0 refills | Status: DC | PRN
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
## 2020-06-13 ENCOUNTER — Emergency Department (HOSPITAL_COMMUNITY): Payer: No Typology Code available for payment source

## 2020-06-13 ENCOUNTER — Encounter (HOSPITAL_COMMUNITY): Payer: Self-pay | Admitting: Emergency Medicine

## 2020-06-13 ENCOUNTER — Emergency Department (HOSPITAL_COMMUNITY)
Admission: EM | Admit: 2020-06-13 | Discharge: 2020-06-14 | Disposition: A | Discharge status: Home / Self Care | Payer: No Typology Code available for payment source | Attending: Emergency Medicine | Admitting: Emergency Medicine

## 2020-06-13 ENCOUNTER — Other Ambulatory Visit: Payer: Self-pay

## 2020-06-13 DIAGNOSIS — K529 Noninfective gastroenteritis and colitis, unspecified: Secondary | ICD-10-CM

## 2020-06-13 DIAGNOSIS — Z20822 Contact with and (suspected) exposure to covid-19: Secondary | ICD-10-CM | POA: Insufficient documentation

## 2020-06-13 DIAGNOSIS — Z79899 Other long term (current) drug therapy: Secondary | ICD-10-CM | POA: Diagnosis not present

## 2020-06-13 DIAGNOSIS — Z7984 Long term (current) use of oral hypoglycemic drugs: Secondary | ICD-10-CM | POA: Insufficient documentation

## 2020-06-13 DIAGNOSIS — I10 Essential (primary) hypertension: Secondary | ICD-10-CM | POA: Diagnosis not present

## 2020-06-13 DIAGNOSIS — R1032 Left lower quadrant pain: Secondary | ICD-10-CM | POA: Diagnosis not present

## 2020-06-13 DIAGNOSIS — R1031 Right lower quadrant pain: Secondary | ICD-10-CM | POA: Diagnosis not present

## 2020-06-13 DIAGNOSIS — K625 Hemorrhage of anus and rectum: Secondary | ICD-10-CM | POA: Insufficient documentation

## 2020-06-13 DIAGNOSIS — K921 Melena: Secondary | ICD-10-CM

## 2020-06-13 DIAGNOSIS — Z8546 Personal history of malignant neoplasm of prostate: Secondary | ICD-10-CM | POA: Insufficient documentation

## 2020-06-13 DIAGNOSIS — E119 Type 2 diabetes mellitus without complications: Secondary | ICD-10-CM | POA: Diagnosis not present

## 2020-06-13 LAB — COMPREHENSIVE METABOLIC PANEL
ALT: 12 U/L (ref 0–44)
AST: 15 U/L (ref 15–41)
Albumin: 3.3 g/dL — ABNORMAL LOW (ref 3.5–5.0)
Alkaline Phosphatase: 44 U/L (ref 38–126)
Anion gap: 7 (ref 5–15)
BUN: 25 mg/dL — ABNORMAL HIGH (ref 8–23)
CO2: 23 mmol/L (ref 22–32)
Calcium: 8.5 mg/dL — ABNORMAL LOW (ref 8.9–10.3)
Chloride: 107 mmol/L (ref 98–111)
Creatinine, Ser: 1.93 mg/dL — ABNORMAL HIGH (ref 0.61–1.24)
GFR, Estimated: 37 mL/min — ABNORMAL LOW (ref >60)
Glucose, Bld: 170 mg/dL — ABNORMAL HIGH (ref 70–99)
Potassium: 3.6 mmol/L (ref 3.5–5.1)
Sodium: 137 mmol/L (ref 135–145)
Total Bilirubin: 0.8 mg/dL (ref 0.3–1.2)
Total Protein: 6.6 g/dL (ref 6.5–8.1)

## 2020-06-13 LAB — CBC
HCT: 31.7 % — ABNORMAL LOW (ref 39.0–52.0)
Hemoglobin: 9.2 g/dL — ABNORMAL LOW (ref 13.0–17.0)
MCH: 26.4 pg (ref 26.0–34.0)
MCHC: 29 g/dL — ABNORMAL LOW (ref 30.0–36.0)
MCV: 91.1 fL (ref 80.0–100.0)
Platelets: 196 10*3/uL (ref 150–400)
RBC: 3.48 MIL/uL — ABNORMAL LOW (ref 4.22–5.81)
RDW: 13.6 % (ref 11.5–15.5)
WBC: 7.4 10*3/uL (ref 4.0–10.5)
nRBC: 0 % (ref 0.0–0.2)

## 2020-06-13 LAB — TYPE AND SCREEN
ABO/RH(D): O POS
Antibody Screen: NEGATIVE

## 2020-06-13 MED ORDER — LACTATED RINGERS IV BOLUS
500.0000 mL | Freq: Once | INTRAVENOUS | Status: AC
  Administered 2020-06-13: 500 mL via INTRAVENOUS

## 2020-06-13 MED ORDER — METRONIDAZOLE IN NACL 5-0.79 MG/ML-% IV SOLN
500.0000 mg | Freq: Once | INTRAVENOUS | Status: AC
  Administered 2020-06-13: 500 mg via INTRAVENOUS
  Filled 2020-06-13: qty 100

## 2020-06-13 MED ORDER — LACTATED RINGERS IV SOLN: INTRAVENOUS | Status: DC

## 2020-06-13 MED ORDER — MORPHINE SULFATE (PF) 4 MG/ML IV SOLN
4.0000 mg | Freq: Once | INTRAVENOUS | Status: AC
  Administered 2020-06-13: 4 mg via INTRAVENOUS
  Filled 2020-06-13: qty 1

## 2020-06-13 MED ORDER — ONDANSETRON HCL 4 MG/2ML IJ SOLN
4.0000 mg | Freq: Once | INTRAMUSCULAR | Status: AC
  Administered 2020-06-13: 4 mg via INTRAVENOUS
  Filled 2020-06-13: qty 2

## 2020-06-13 MED ORDER — CIPROFLOXACIN IN D5W 400 MG/200ML IV SOLN
400.0000 mg | Freq: Once | INTRAVENOUS | Status: AC
  Administered 2020-06-13: 400 mg via INTRAVENOUS
  Filled 2020-06-13: qty 200

## 2020-06-13 NOTE — ED Provider Notes (Signed)
[facility] EMERGENCY DEPARTMENT Provider Note   CSN: [id number] Arrival date & time: 06/13/20  1716     History Chief Complaint  Patient presents with  . Rectal Bleeding  . Abdominal Pain    Troy Johnston is a 70 y.o. male.  HPI    70 year old male with history of diabetes, diverticulosis comes in a chief complaint of rectal bleeding and abdominal pain.  Patient also has prostate cancer history, but he is currently cancer free.  He reports that he woke up in the middle night and had bloody stools.  After his BM, he started having lower quadrant abdominal pain.  Pain has intensified over time and is currently at 8 out of 10.  The pain is located in the lower abdominal quadrants, worse on the left side.  Although patient has not had a large bloody stool, he continues to have blood seeping through his rectum continually throughout the day.  The blood is dark red.  There is no melena.  Patient has no upper quadrant abdominal pain.  He denies any history of heavy smoking, drinking, cardiovascular disease such as stroke, CAD, A. Fib.  No history of GI bleed in the past.  No recent endoscopies.  Patient is not on any blood thinners or antiplatelet agents.  Past Medical History:  Diagnosis Date  . Diabetes mellitus without complication ([place])   . GERD (gastroesophageal reflux disease)   . Hypertension   . Prostate cancer [facility])     Patient Active Problem List   Diagnosis Date Noted  . PERSONAL HX COLONIC POLYPS 04/27/2009  . CONSTIPATION 05/13/2008  . RECTAL BLEEDING 05/13/2008  . ABDOMINAL PAIN-GENERALIZED 05/13/2008    History reviewed. No pertinent surgical history.     No family history on file.  Social History   Tobacco Use  . Smoking status: Never Smoker  Substance Use Topics  . Alcohol use: No  . Drug use: No    Home Medications Prior to Admission medications   Medication Sig Start Date End Date Taking? Authorizing Provider  allopurinol  (ZYLOPRIM) 100 MG tablet Take 50 mg by mouth every morning. For high uric acid level   Yes [provider]  Alogliptin Benzoate 12.5 MG TABS Take 6.25 mg by mouth every morning. For diabetes (replaces metformin)   Yes [provider]  atorvastatin (LIPITOR) 20 MG tablet Take 20 mg by mouth at bedtime. For cholesterol   Yes [provider]  Carboxymethylcellulose Sodium 0.25 % SOLN Place 1 drop into both eyes 3 (three) times daily.   Yes [provider]  Cholecalciferol (VITAMIN D) 50 MCG (2000 UT) tablet Take 4,000 Units by mouth in the morning.   Yes [provider]  ferrous sulfate 325 (65 FE) MG tablet Take 325 mg by mouth See admin instructions. Take one tablet (325 mg) by mouth three days week - Monday, Wednesday, Thursday   Yes [provider]  glipiZIDE (GLUCOTROL) 10 MG tablet Take 10 mg by mouth 2 (two) times daily before a meal. For diabetes   Yes [provider]  hydrALAZINE (APRESOLINE) 100 MG tablet Take 100 mg by mouth every 8 (eight) hours.   Yes [provider]  lisinopril (ZESTRIL) 10 MG tablet Take 10 mg by mouth in the morning.   Yes [provider]    Allergies    Penicillins  Review of Systems   Review of Systems  Constitutional: Positive for activity change.  Respiratory: Negative for shortness of breath.  Gastrointestinal: Positive for abdominal pain and blood in stool.  Neurological: Negative for dizziness.  Hematological: Does not bruise/bleed easily.  All other systems reviewed and are negative.   Physical Exam Updated Vital Signs BP (!) 188/66   Pulse 82   Temp 98.8 F (37.1 C)   Resp 17   SpO2 100%   Physical Exam Vitals and nursing note reviewed.  Constitutional:      Appearance: He is well-developed.  HENT:     Head: Normocephalic and atraumatic.  Eyes:     Conjunctiva/sclera: Conjunctivae normal.     Pupils: Pupils are equal, round, and reactive to light.   Cardiovascular:     Rate and Rhythm: Normal rate and regular rhythm.  Pulmonary:     Effort: Pulmonary effort is normal.     Breath sounds: Normal breath sounds.  Abdominal:     General: Bowel sounds are normal. There is no distension.     Palpations: Abdomen is soft.     Tenderness: There is abdominal tenderness in the right lower quadrant, suprapubic area and left lower quadrant. There is no guarding or rebound.  Musculoskeletal:        General: No deformity.     Cervical back: Normal range of motion and neck supple.  Skin:    General: Skin is warm.  Neurological:     Mental Status: He is alert and oriented to person, place, and time.     ED Results / Procedures / Treatments   Labs (all labs ordered are listed, but only abnormal results are displayed) Labs Reviewed  COMPREHENSIVE METABOLIC PANEL - Abnormal; Notable for the following components:      Result Value   Glucose, Bld 170 (*)    BUN 25 (*)    Creatinine, Ser 1.93 (*)    Calcium 8.5 (*)    Albumin 3.3 (*)    GFR, Estimated 37 (*)    All other components within normal limits  CBC - Abnormal; Notable for the following components:   RBC 3.48 (*)    Hemoglobin 9.2 (*)    HCT 31.7 (*)    MCHC 29.0 (*)    All other components within normal limits  SARS CORONAVIRUS 2 (TAT 6-24 HRS)  POC OCCULT BLOOD, ED  TYPE AND SCREEN  ABO/RH    EKG None  Radiology No results found.  Procedures Procedures   Medications Ordered in ED Medications  lactated ringers infusion (has no administration in time range)  morphine 4 MG/ML injection 4 mg (4 mg Intravenous Given 06/13/20 1954)  lactated ringers bolus 500 mL (500 mLs Intravenous New Bag/Given 06/13/20 2051)    ED Course  I have reviewed the triage vital signs and the nursing notes.  Pertinent labs & imaging results that were available during my care of the patient were reviewed by me and considered in my medical decision making (see chart for details).    MDM  Rules/Calculators/A&P                          70 year old male comes in with chief complaint of abdominal pain.  Patient is complaining of lower quadrant abdominal pain and bloody stools.  The bleeding started before the pain.  Patient has history of diverticulosis per colonoscopy from 2011.  He is diabetic.  He has no history of GI bleed.  He has no risk factors for upper GI bleed and has no epigastric abdominal pain.  There is no  history of AAA, heavy smoking.  On exam patient is noted to have lower quadrant abdominal pain, worse over the left lower quadrant.  Patient initially was hesitant for getting rectal exam, reports that he has been continually seeping blood.  Given the abdominal pain along with bleeding, suspecting that patient has colitis.  Diverticular bleed is usually painless, so there could be superimposed condition causing the pain.  CT abdomen pelvis without contrast has been ordered.  Patient's care has been signed out to APP.  If patient CT scan shows colitis, patient is likely stable for discharge to home with antibiotics with strict ER return precautions. If the CT scan is negative for colitis, then it is likely best to admit the patient for serial H&H given the persistent bleeding he is having.  Final Clinical Impression(s) / ED Diagnoses Final diagnoses:  Hematochezia    Rx / DC Orders ED Discharge Orders    None       Varney Biles, MD 06/13/20 2101

## 2020-06-13 NOTE — ED Notes (Signed)
Pt transported to CT ?

## 2020-06-13 NOTE — ED Triage Notes (Signed)
Pt reports dark red rectal bleeding with clots since this morning.  Reports generalized abd pain.

## 2020-06-13 NOTE — Discharge Instructions (Addendum)
Get help right away if: You have a fever that does not go away with treatment. You develop chills. You have extreme weakness, fainting, or dehydration. You vomit repeatedly. You develop severe pain in your abdomen. You pass bloody or tarry stool.

## 2020-06-14 LAB — SARS CORONAVIRUS 2 (TAT 6-24 HRS): SARS Coronavirus 2: NEGATIVE

## 2020-06-14 MED ORDER — METRONIDAZOLE 500 MG PO TABS: 500.0000 mg | ORAL_TABLET | Freq: Two times a day (BID) | ORAL | 0 refills | Status: DC

## 2020-06-14 MED ORDER — CIPROFLOXACIN HCL 500 MG PO TABS: 500.0000 mg | ORAL_TABLET | Freq: Two times a day (BID) | ORAL | 0 refills | Status: DC

## 2020-06-14 MED ORDER — OXYCODONE HCL 5 MG PO TABS: 2.5000 mg | ORAL_TABLET | Freq: Four times a day (QID) | ORAL | 0 refills | Status: DC | PRN

## 2020-06-14 NOTE — ED Provider Notes (Signed)
70  Y/o male taken in sign out here with hematocezia and abd. Pain/ labs reviewed. CT shows colitis. Given cipro and flagyl. Pain meds at DC after PDMP review.  HDS. Discussed return precautions   Margarita Mail, PA-C 06/14/20 0032    Varney Biles, MD 06/14/20 1414

## 2020-06-14 NOTE — ED Notes (Signed)
Patient verbalizes understanding of discharge instructions. Prescriptions reviewed.  Opportunity for questioning and answers were provided. Armband removed by staff, pt discharged from ED via wheelchair with friend.

## 2021-05-23 IMAGING — CT CT ABD-PELV W/O CM
2 of 4 series · 16 of 46 positions shown, 18 images · non-contrast
Comparison: 11/14/2019

CLINICAL DATA: Abdominal pain, rectal bleeding

EXAM:
CT ABDOMEN AND PELVIS WITHOUT CONTRAST
TECHNIQUE: Multidetector CT imaging of the abdomen and pelvis was performed
following the standard protocol without IV contrast.

[Series 3: abd/ pelvis 5.0 i30f 2 · axial · 0.94mm/px · z∈[+744,+1184]mm · 13 of 96 slices shown, 15 images]
[im 4/96  soft-tissue]
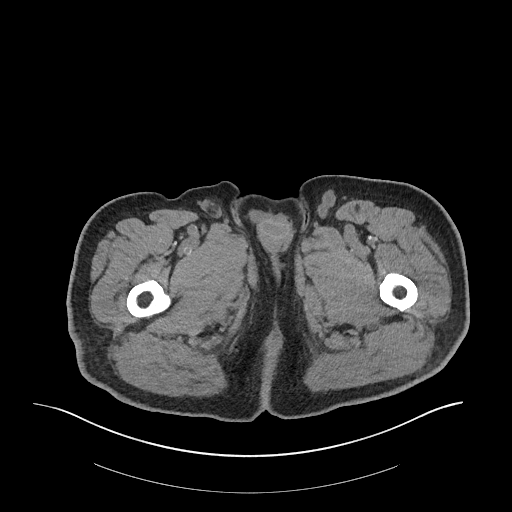
[im 4/96  bone]
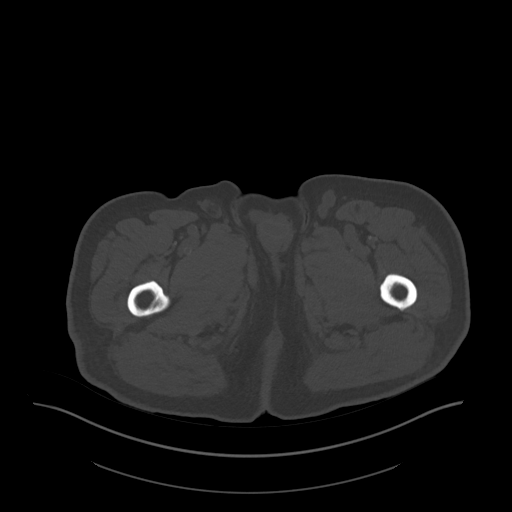
[im 12/96  soft-tissue]
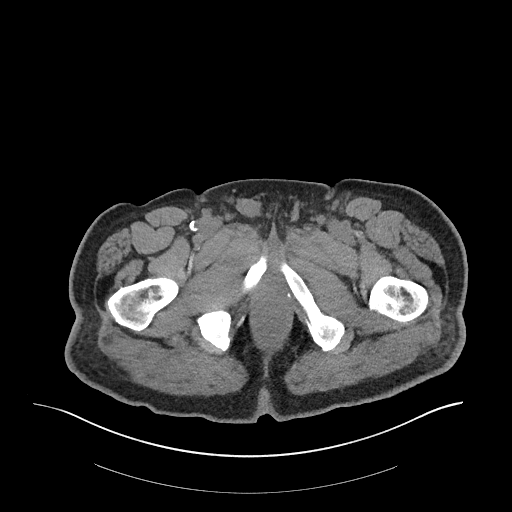
[im 20/96  soft-tissue]
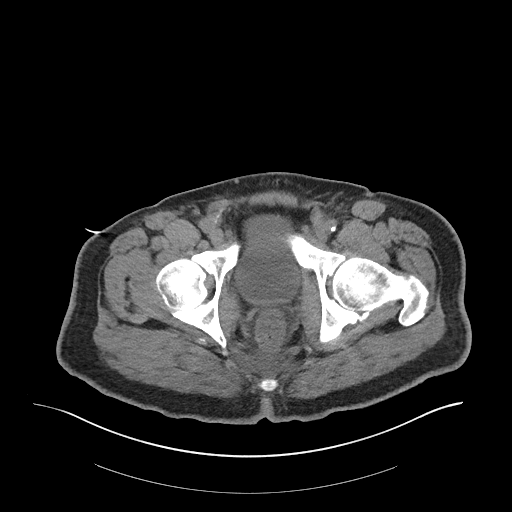
[im 28/96  soft-tissue]
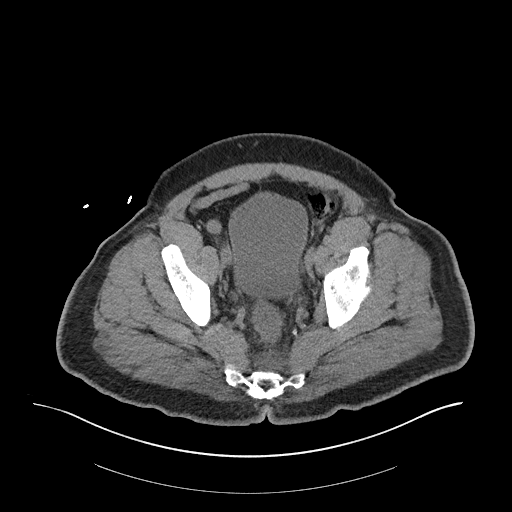
[im 32/96  soft-tissue]
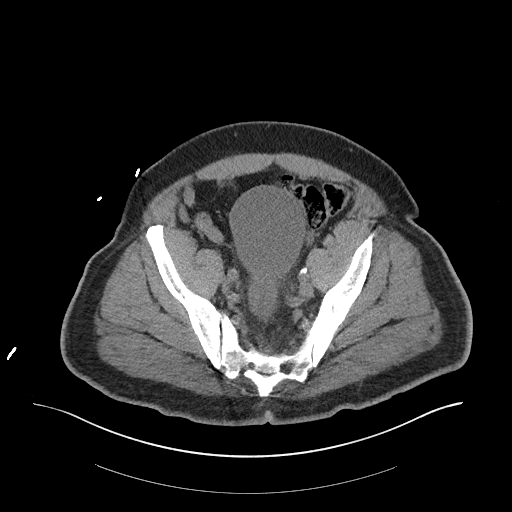
[im 40/96  soft-tissue]
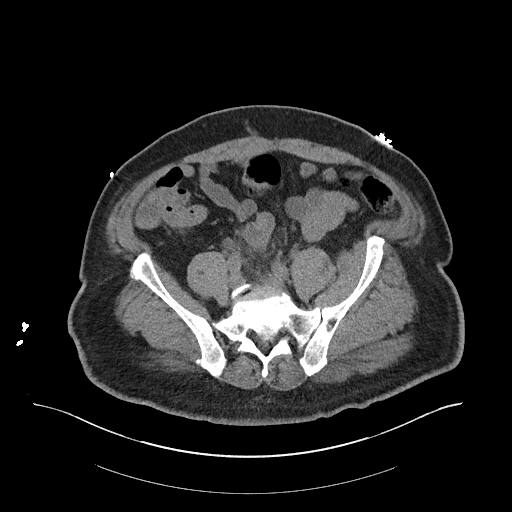
[im 48/96  soft-tissue]
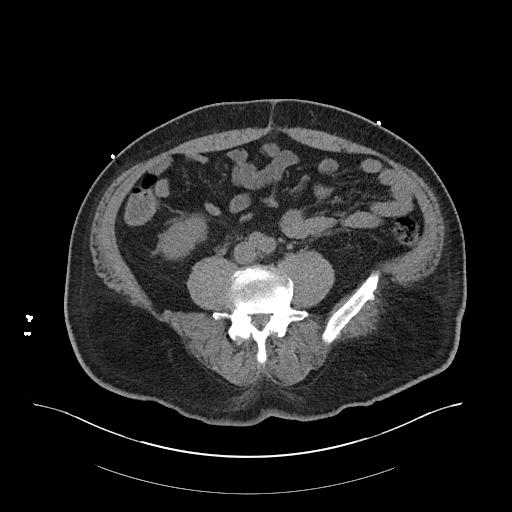
[im 56/96  soft-tissue]
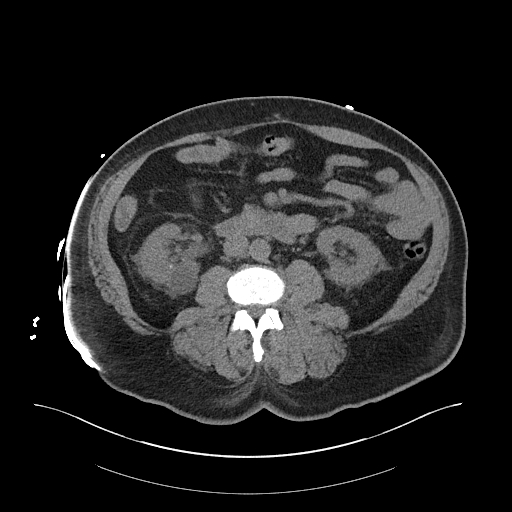
[im 64/96  soft-tissue]
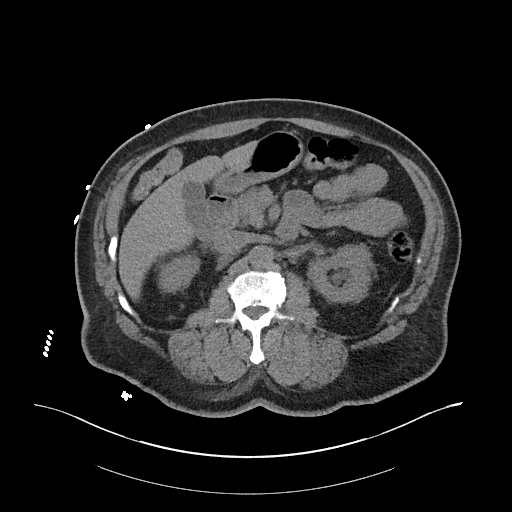
[im 64/96  bone]
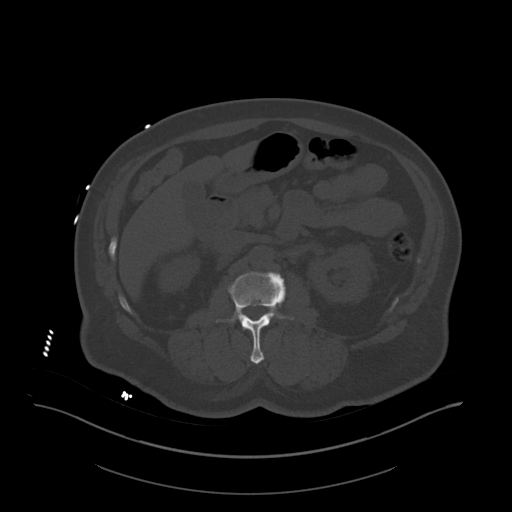
[im 68/96  soft-tissue]
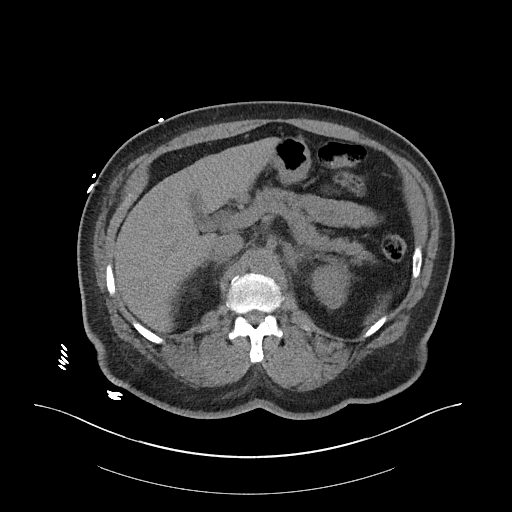
[im 76/96  soft-tissue]
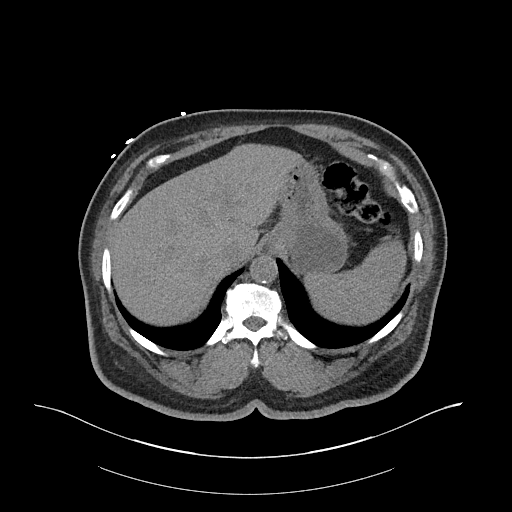
[im 84/96  soft-tissue]
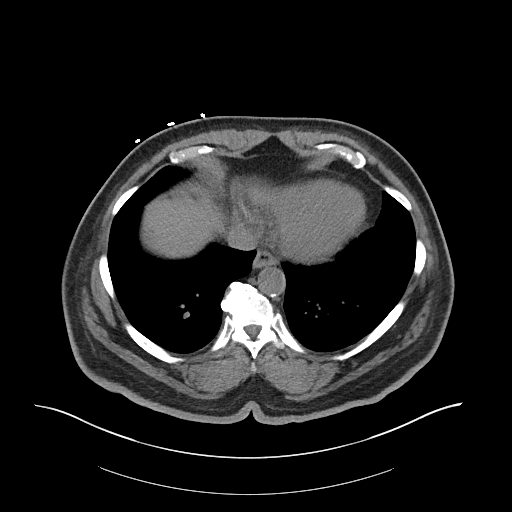
[im 92/96  soft-tissue]
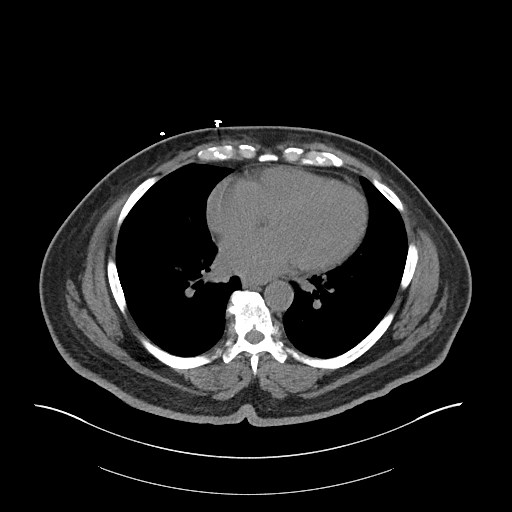

[Series 6: cor st · coronal · 0.82mm/px · 3 of 104 slices shown]
[im 35/104  soft-tissue]
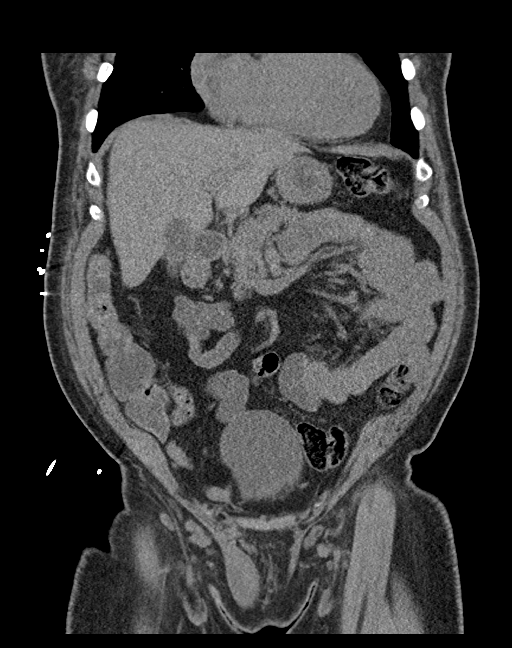
[im 46/104  soft-tissue]
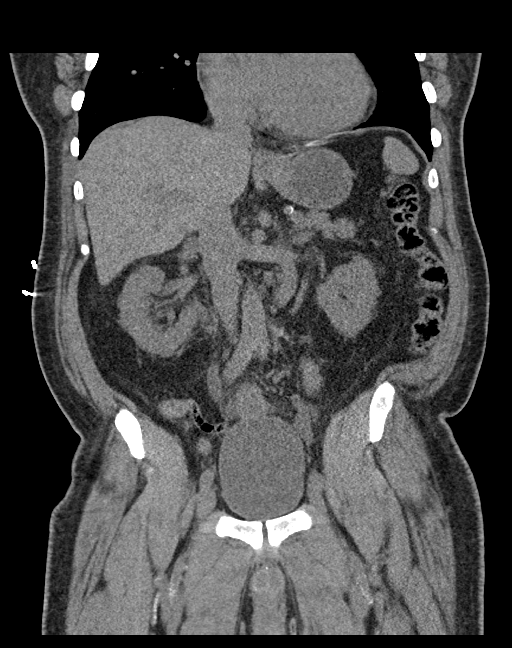
[im 58/104  soft-tissue]
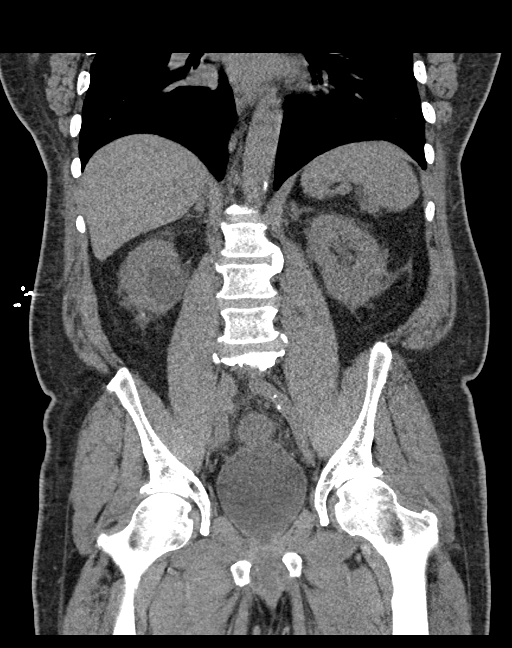

[16 of 46 positions shown; findings below may reference images not displayed]

FINDINGS: Lower chest: No acute pleural or parenchymal lung disease.

Unenhanced CT was performed per clinician order. Lack of IV contrast
limits sensitivity and specificity, especially for evaluation of
abdominal/pelvic solid viscera.

Hepatobiliary: No focal liver abnormality is seen. No gallstones,
gallbladder wall thickening, or biliary dilatation.

Pancreas: Unremarkable. No pancreatic ductal dilatation or
surrounding inflammatory changes.

Spleen: Normal in size without focal abnormality.

Adrenals/Urinary Tract: Nonobstructing 8 mm calculus lower pole
right kidney. Nonobstructing 3 mm calculus lower pole left kidney.
No obstructive uropathy within either kidney. Stable right renal
cortical cyst. The adrenals are stable. Bladder is unremarkable.

Stomach/Bowel: There is circumferential wall thickening of the
distal sigmoid colon and rectum, consistent with inflammatory or
infectious colitis. No bowel obstruction or ileus. Normal appendix
right lower quadrant.

Vascular/Lymphatic: Aortic atherosclerosis. No enlarged abdominal or
pelvic lymph nodes.

Reproductive: Prostate is not well visualized and may be surgically
absent.

Other: There is significant perirectal fat stranding, with prominent
edema in the presacral space. No free intraperitoneal fluid or free
gas. No abdominal wall hernia.

Musculoskeletal: No acute or destructive bony lesions. Reconstructed
images demonstrate no additional findings.
IMPRESSION: 1. Circumferential wall thickening of the distal sigmoid colon and
rectum, compatible with inflammatory or infectious colitis.
2. Edema in the perirectal soft tissues and presacral space,
consistent with reactive changes from colitis described above.
3. Bilateral nonobstructing renal calculi.
4.  Aortic Atherosclerosis ([id number]).

## 2022-05-06 ENCOUNTER — Encounter (HOSPITAL_BASED_OUTPATIENT_CLINIC_OR_DEPARTMENT_OTHER): Payer: Self-pay

## 2022-06-22 ENCOUNTER — Ambulatory Visit (HOSPITAL_BASED_OUTPATIENT_CLINIC_OR_DEPARTMENT_OTHER): Payer: No Typology Code available for payment source | Admitting: Physical Therapy

## 2022-06-24 ENCOUNTER — Other Ambulatory Visit (HOSPITAL_COMMUNITY): Payer: Self-pay | Admitting: Orthopedic Surgery

## 2022-06-24 DIAGNOSIS — M25552 Pain in left hip: Secondary | ICD-10-CM

## 2022-07-06 ENCOUNTER — Ambulatory Visit (HOSPITAL_COMMUNITY)
Admission: RE | Admit: 2022-07-06 | Discharge: 2022-07-06 | Disposition: A | Discharge status: Home / Self Care | Payer: No Typology Code available for payment source | Source: Ambulatory Visit | Attending: Orthopedic Surgery | Admitting: Orthopedic Surgery

## 2022-07-06 DIAGNOSIS — M25552 Pain in left hip: Secondary | ICD-10-CM | POA: Diagnosis present

## 2022-07-06 DIAGNOSIS — M25551 Pain in right hip: Secondary | ICD-10-CM | POA: Insufficient documentation

## 2022-07-06 MED ORDER — BUPIVACAINE HCL (PF) 0.25 % IJ SOLN
8.0000 mL | Freq: Once | INTRAMUSCULAR | Status: AC
  Administered 2022-07-06: 8 mL

## 2022-07-06 MED ORDER — METHYLPREDNISOLONE ACETATE 40 MG/ML INJ SUSP (RADIOLOG
40.0000 mg | Freq: Once | INTRAMUSCULAR | Status: AC
  Administered 2022-07-06: 40 mg via INTRA_ARTICULAR

## 2022-07-06 MED ORDER — TRIAMCINOLONE ACETONIDE 40 MG/ML IJ SUSP (RADIOLOGY): 40.0000 mg | Freq: Once | INTRAMUSCULAR | Status: DC

## 2022-07-06 MED ORDER — LIDOCAINE HCL (PF) 1 % IJ SOLN
10.0000 mL | Freq: Once | INTRAMUSCULAR | Status: AC
  Administered 2022-07-06: 8 mL via INTRADERMAL

## 2022-07-06 MED ORDER — IOHEXOL 180 MG/ML  SOLN
10.0000 mL | Freq: Once | INTRAMUSCULAR | Status: AC | PRN
  Administered 2022-07-06: 8 mL via INTRA_ARTICULAR

## 2022-10-25 ENCOUNTER — Inpatient Hospital Stay (HOSPITAL_COMMUNITY): Payer: No Typology Code available for payment source

## 2022-10-25 ENCOUNTER — Other Ambulatory Visit: Payer: Self-pay

## 2022-10-25 ENCOUNTER — Emergency Department (HOSPITAL_COMMUNITY): Payer: No Typology Code available for payment source

## 2022-10-25 ENCOUNTER — Inpatient Hospital Stay (HOSPITAL_COMMUNITY)
Admission: EM | Admit: 2022-10-25 | Discharge: 2022-11-09 | DRG: 871 | Disposition: A | Discharge status: Home Health | Payer: No Typology Code available for payment source | Attending: Internal Medicine | Admitting: Internal Medicine

## 2022-10-25 DIAGNOSIS — I5082 Biventricular heart failure: Secondary | ICD-10-CM | POA: Diagnosis not present

## 2022-10-25 DIAGNOSIS — I469 Cardiac arrest, cause unspecified: Secondary | ICD-10-CM

## 2022-10-25 DIAGNOSIS — N186 End stage renal disease: Secondary | ICD-10-CM | POA: Diagnosis not present

## 2022-10-25 DIAGNOSIS — A419 Sepsis, unspecified organism: Secondary | ICD-10-CM | POA: Diagnosis not present

## 2022-10-25 DIAGNOSIS — I480 Paroxysmal atrial fibrillation: Secondary | ICD-10-CM | POA: Diagnosis present

## 2022-10-25 DIAGNOSIS — R579 Shock, unspecified: Secondary | ICD-10-CM

## 2022-10-25 DIAGNOSIS — Z1629 Resistance to other single specified antibiotic: Secondary | ICD-10-CM | POA: Diagnosis present

## 2022-10-25 DIAGNOSIS — I21A1 Myocardial infarction type 2: Secondary | ICD-10-CM | POA: Diagnosis not present

## 2022-10-25 DIAGNOSIS — M940 Chondrocostal junction syndrome [Tietze]: Secondary | ICD-10-CM | POA: Diagnosis not present

## 2022-10-25 DIAGNOSIS — E872 Acidosis, unspecified: Secondary | ICD-10-CM

## 2022-10-25 DIAGNOSIS — Z1152 Encounter for screening for COVID-19: Secondary | ICD-10-CM | POA: Diagnosis not present

## 2022-10-25 DIAGNOSIS — Z794 Long term (current) use of insulin: Secondary | ICD-10-CM

## 2022-10-25 DIAGNOSIS — I214 Non-ST elevation (NSTEMI) myocardial infarction: Secondary | ICD-10-CM

## 2022-10-25 DIAGNOSIS — A4159 Other Gram-negative sepsis: Secondary | ICD-10-CM | POA: Diagnosis not present

## 2022-10-25 DIAGNOSIS — D696 Thrombocytopenia, unspecified: Secondary | ICD-10-CM | POA: Diagnosis present

## 2022-10-25 DIAGNOSIS — R570 Cardiogenic shock: Secondary | ICD-10-CM | POA: Diagnosis not present

## 2022-10-25 DIAGNOSIS — N189 Chronic kidney disease, unspecified: Secondary | ICD-10-CM | POA: Diagnosis present

## 2022-10-25 DIAGNOSIS — A0811 Acute gastroenteropathy due to Norwalk agent: Secondary | ICD-10-CM | POA: Diagnosis present

## 2022-10-25 DIAGNOSIS — Z7901 Long term (current) use of anticoagulants: Secondary | ICD-10-CM

## 2022-10-25 DIAGNOSIS — I4901 Ventricular fibrillation: Secondary | ICD-10-CM | POA: Diagnosis not present

## 2022-10-25 DIAGNOSIS — E1165 Type 2 diabetes mellitus with hyperglycemia: Secondary | ICD-10-CM | POA: Diagnosis present

## 2022-10-25 DIAGNOSIS — N179 Acute kidney failure, unspecified: Secondary | ICD-10-CM | POA: Diagnosis present

## 2022-10-25 DIAGNOSIS — R6521 Severe sepsis with septic shock: Secondary | ICD-10-CM

## 2022-10-25 DIAGNOSIS — J9601 Acute respiratory failure with hypoxia: Secondary | ICD-10-CM | POA: Diagnosis present

## 2022-10-25 DIAGNOSIS — A4181 Sepsis due to Enterococcus: Secondary | ICD-10-CM | POA: Diagnosis present

## 2022-10-25 DIAGNOSIS — D509 Iron deficiency anemia, unspecified: Secondary | ICD-10-CM | POA: Diagnosis present

## 2022-10-25 DIAGNOSIS — K7200 Acute and subacute hepatic failure without coma: Secondary | ICD-10-CM | POA: Diagnosis present

## 2022-10-25 DIAGNOSIS — N170 Acute kidney failure with tubular necrosis: Secondary | ICD-10-CM | POA: Diagnosis present

## 2022-10-25 DIAGNOSIS — D631 Anemia in chronic kidney disease: Secondary | ICD-10-CM | POA: Diagnosis present

## 2022-10-25 DIAGNOSIS — E785 Hyperlipidemia, unspecified: Secondary | ICD-10-CM | POA: Diagnosis present

## 2022-10-25 DIAGNOSIS — E1122 Type 2 diabetes mellitus with diabetic chronic kidney disease: Secondary | ICD-10-CM | POA: Diagnosis present

## 2022-10-25 DIAGNOSIS — Z66 Do not resuscitate: Secondary | ICD-10-CM | POA: Diagnosis not present

## 2022-10-25 DIAGNOSIS — E8881 Metabolic syndrome: Secondary | ICD-10-CM | POA: Diagnosis present

## 2022-10-25 DIAGNOSIS — Z8711 Personal history of peptic ulcer disease: Secondary | ICD-10-CM

## 2022-10-25 DIAGNOSIS — Z992 Dependence on renal dialysis: Secondary | ICD-10-CM | POA: Diagnosis not present

## 2022-10-25 DIAGNOSIS — K219 Gastro-esophageal reflux disease without esophagitis: Secondary | ICD-10-CM | POA: Diagnosis present

## 2022-10-25 DIAGNOSIS — I132 Hypertensive heart and chronic kidney disease with heart failure and with stage 5 chronic kidney disease, or end stage renal disease: Secondary | ICD-10-CM | POA: Diagnosis present

## 2022-10-25 DIAGNOSIS — N1330 Unspecified hydronephrosis: Secondary | ICD-10-CM | POA: Diagnosis present

## 2022-10-25 DIAGNOSIS — Z8546 Personal history of malignant neoplasm of prostate: Secondary | ICD-10-CM

## 2022-10-25 DIAGNOSIS — Z880 Allergy status to penicillin: Secondary | ICD-10-CM

## 2022-10-25 DIAGNOSIS — Z6834 Body mass index (BMI) 34.0-34.9, adult: Secondary | ICD-10-CM

## 2022-10-25 DIAGNOSIS — R0602 Shortness of breath: Principal | ICD-10-CM

## 2022-10-25 DIAGNOSIS — E669 Obesity, unspecified: Secondary | ICD-10-CM | POA: Diagnosis present

## 2022-10-25 DIAGNOSIS — R34 Anuria and oliguria: Secondary | ICD-10-CM | POA: Diagnosis present

## 2022-10-25 DIAGNOSIS — Z79899 Other long term (current) drug therapy: Secondary | ICD-10-CM

## 2022-10-25 DIAGNOSIS — I462 Cardiac arrest due to underlying cardiac condition: Secondary | ICD-10-CM | POA: Diagnosis not present

## 2022-10-25 DIAGNOSIS — Z789 Other specified health status: Secondary | ICD-10-CM

## 2022-10-25 DIAGNOSIS — R008 Other abnormalities of heart beat: Secondary | ICD-10-CM | POA: Diagnosis not present

## 2022-10-25 DIAGNOSIS — B952 Enterococcus as the cause of diseases classified elsewhere: Secondary | ICD-10-CM | POA: Diagnosis present

## 2022-10-25 DIAGNOSIS — I5021 Acute systolic (congestive) heart failure: Secondary | ICD-10-CM | POA: Diagnosis not present

## 2022-10-25 DIAGNOSIS — Z7984 Long term (current) use of oral hypoglycemic drugs: Secondary | ICD-10-CM

## 2022-10-25 LAB — I-STAT CHEM 8, ED
BUN: 83 mg/dL — ABNORMAL HIGH (ref 8–23)
Calcium, Ion: 0.82 mmol/L — CL (ref 1.15–1.40)
Chloride: 109 mmol/L (ref 98–111)
Creatinine, Ser: 8.7 mg/dL — ABNORMAL HIGH (ref 0.61–1.24)
Glucose, Bld: 98 mg/dL (ref 70–99)
HCT: 46 % (ref 39.0–52.0)
Hemoglobin: 15.6 g/dL (ref 13.0–17.0)
Potassium: 4 mmol/L (ref 3.5–5.1)
Sodium: 138 mmol/L (ref 135–145)
TCO2: 13 mmol/L — ABNORMAL LOW (ref 22–32)

## 2022-10-25 LAB — BLOOD GAS, VENOUS
Acid-base deficit: 8.9 mmol/L — ABNORMAL HIGH (ref 0.0–2.0)
Bicarbonate: 19.5 mmol/L — ABNORMAL LOW (ref 20.0–28.0)
O2 Saturation: 69.3 %
Patient temperature: 36.7
pCO2, Ven: 50 mmHg (ref 44–60)
pH, Ven: 7.19 — CL (ref 7.25–7.43)
pO2, Ven: 41 mmHg (ref 32–45)

## 2022-10-25 LAB — BASIC METABOLIC PANEL
Anion gap: 20 — ABNORMAL HIGH (ref 5–15)
BUN: 94 mg/dL — ABNORMAL HIGH (ref 8–23)
CO2: 16 mmol/L — ABNORMAL LOW (ref 22–32)
Calcium: 7.3 mg/dL — ABNORMAL LOW (ref 8.9–10.3)
Chloride: 101 mmol/L (ref 98–111)
Creatinine, Ser: 7.93 mg/dL — ABNORMAL HIGH (ref 0.61–1.24)
GFR, Estimated: 7 mL/min — ABNORMAL LOW (ref >60)
Glucose, Bld: 193 mg/dL — ABNORMAL HIGH (ref 70–99)
Potassium: 4.2 mmol/L (ref 3.5–5.1)
Sodium: 137 mmol/L (ref 135–145)

## 2022-10-25 LAB — COMPREHENSIVE METABOLIC PANEL
ALT: 59 U/L — ABNORMAL HIGH (ref 0–44)
AST: 163 U/L — ABNORMAL HIGH (ref 15–41)
Albumin: 2.9 g/dL — ABNORMAL LOW (ref 3.5–5.0)
Alkaline Phosphatase: 133 U/L — ABNORMAL HIGH (ref 38–126)
Anion gap: 28 — ABNORMAL HIGH (ref 5–15)
BUN: 91 mg/dL — ABNORMAL HIGH (ref 8–23)
CO2: 10 mmol/L — ABNORMAL LOW (ref 22–32)
Calcium: 7.7 mg/dL — ABNORMAL LOW (ref 8.9–10.3)
Chloride: 102 mmol/L (ref 98–111)
Creatinine, Ser: 8.02 mg/dL — ABNORMAL HIGH (ref 0.61–1.24)
GFR, Estimated: 7 mL/min — ABNORMAL LOW (ref >60)
Glucose, Bld: 103 mg/dL — ABNORMAL HIGH (ref 70–99)
Potassium: 4.1 mmol/L (ref 3.5–5.1)
Sodium: 140 mmol/L (ref 135–145)
Total Bilirubin: 0.8 mg/dL (ref 0.3–1.2)
Total Protein: 6.7 g/dL (ref 6.5–8.1)

## 2022-10-25 LAB — TROPONIN I (HIGH SENSITIVITY)
Troponin I (High Sensitivity): 2879 ng/L (ref <18)
Troponin I (High Sensitivity): 2461 ng/L (ref <18)

## 2022-10-25 LAB — CBC
HCT: 35.1 % — ABNORMAL LOW (ref 39.0–52.0)
HCT: 30.8 % — ABNORMAL LOW (ref 39.0–52.0)
Hemoglobin: 10.4 g/dL — ABNORMAL LOW (ref 13.0–17.0)
Hemoglobin: 9.6 g/dL — ABNORMAL LOW (ref 13.0–17.0)
MCH: 26.7 pg (ref 26.0–34.0)
MCH: 26.7 pg (ref 26.0–34.0)
MCHC: 29.6 g/dL — ABNORMAL LOW (ref 30.0–36.0)
MCHC: 31.2 g/dL (ref 30.0–36.0)
MCV: 90 fL (ref 80.0–100.0)
MCV: 85.6 fL (ref 80.0–100.0)
Platelets: 74 10*3/uL — ABNORMAL LOW (ref 150–400)
Platelets: 53 10*3/uL — ABNORMAL LOW (ref 150–400)
RBC: 3.9 MIL/uL — ABNORMAL LOW (ref 4.22–5.81)
RBC: 3.6 MIL/uL — ABNORMAL LOW (ref 4.22–5.81)
RDW: 14.2 % (ref 11.5–15.5)
RDW: 14 % (ref 11.5–15.5)
WBC: 6.7 10*3/uL (ref 4.0–10.5)
WBC: 10.6 10*3/uL — ABNORMAL HIGH (ref 4.0–10.5)
nRBC: 0.4 % — ABNORMAL HIGH (ref 0.0–0.2)
nRBC: 0 % (ref 0.0–0.2)

## 2022-10-25 LAB — DIFFERENTIAL
Abs Immature Granulocytes: 0.07 10*3/uL (ref 0.00–0.07)
Basophils Absolute: 0.1 10*3/uL (ref 0.0–0.1)
Basophils Relative: 1 %
Eosinophils Absolute: 0 10*3/uL (ref 0.0–0.5)
Eosinophils Relative: 0 %
Immature Granulocytes: 1 %
Lymphocytes Relative: 5 %
Lymphs Abs: 0.5 10*3/uL — ABNORMAL LOW (ref 0.7–4.0)
Monocytes Absolute: 0.5 10*3/uL (ref 0.1–1.0)
Monocytes Relative: 5 %
Neutro Abs: 9.4 10*3/uL — ABNORMAL HIGH (ref 1.7–7.7)
Neutrophils Relative %: 88 %

## 2022-10-25 LAB — BRAIN NATRIURETIC PEPTIDE: B Natriuretic Peptide: 719.5 pg/mL — ABNORMAL HIGH (ref 0.0–100.0)

## 2022-10-25 LAB — I-STAT VENOUS BLOOD GAS, ED
Acid-base deficit: 12 mmol/L — ABNORMAL HIGH (ref 0.0–2.0)
Bicarbonate: 12.2 mmol/L — ABNORMAL LOW (ref 20.0–28.0)
Calcium, Ion: 0.8 mmol/L — CL (ref 1.15–1.40)
HCT: 31 % — ABNORMAL LOW (ref 39.0–52.0)
Hemoglobin: 10.5 g/dL — ABNORMAL LOW (ref 13.0–17.0)
O2 Saturation: 98 %
Potassium: 4.1 mmol/L (ref 3.5–5.1)
Sodium: 137 mmol/L (ref 135–145)
TCO2: 13 mmol/L — ABNORMAL LOW (ref 22–32)
pCO2, Ven: 23.3 mmHg — ABNORMAL LOW (ref 44–60)
pH, Ven: 7.325 (ref 7.25–7.43)
pO2, Ven: 104 mmHg — ABNORMAL HIGH (ref 32–45)

## 2022-10-25 LAB — RESP PANEL BY RT-PCR (RSV, FLU A&B, COVID)  RVPGX2
Influenza A by PCR: NEGATIVE
Influenza B by PCR: NEGATIVE
Resp Syncytial Virus by PCR: NEGATIVE
SARS Coronavirus 2 by RT PCR: NEGATIVE

## 2022-10-25 LAB — PROCALCITONIN: Procalcitonin: >150 ng/mL

## 2022-10-25 LAB — GLUCOSE, CAPILLARY
Glucose-Capillary: 185 mg/dL — ABNORMAL HIGH (ref 70–99)
Glucose-Capillary: 231 mg/dL — ABNORMAL HIGH (ref 70–99)

## 2022-10-25 LAB — LACTIC ACID, PLASMA
Lactic Acid, Venous: 6.8 mmol/L (ref 0.5–1.9)
Lactic Acid, Venous: 5.7 mmol/L (ref 0.5–1.9)
Lactic Acid, Venous: 3.1 mmol/L (ref 0.5–1.9)

## 2022-10-25 LAB — MRSA NEXT GEN BY PCR, NASAL: MRSA by PCR Next Gen: NOT DETECTED

## 2022-10-25 LAB — D-DIMER, QUANTITATIVE: D-Dimer, Quant: 13.93 ug/mL-FEU — ABNORMAL HIGH (ref 0.00–0.50)

## 2022-10-25 MED ORDER — METRONIDAZOLE 500 MG/100ML IV SOLN
500.0000 mg | Freq: Once | INTRAVENOUS | Status: AC
  Administered 2022-10-25: 500 mg via INTRAVENOUS
  Filled 2022-10-25: qty 100

## 2022-10-25 MED ORDER — SODIUM CHLORIDE 0.9 % IV SOLN
2.0000 g | Freq: Once | INTRAVENOUS | Status: AC
  Administered 2022-10-25: 2 g via INTRAVENOUS
  Filled 2022-10-25: qty 10

## 2022-10-25 MED ORDER — SODIUM BICARBONATE 8.4 % IV SOLN
INTRAVENOUS | Status: DC
  Filled 2022-10-25 (×3): qty 1000

## 2022-10-25 MED ORDER — HEPARIN (PORCINE) 25000 UT/250ML-% IV SOLN
1250.0000 [IU]/h | INTRAVENOUS | Status: DC
  Administered 2022-10-25: 1100 [IU]/h via INTRAVENOUS
  Filled 2022-10-25: qty 250

## 2022-10-25 MED ORDER — SODIUM CHLORIDE 0.9 % IV SOLN
250.0000 mL | INTRAVENOUS | Status: DC
  Administered 2022-10-25 – 2022-10-26 (×2): 250 mL via INTRAVENOUS

## 2022-10-25 MED ORDER — VANCOMYCIN HCL 2000 MG/400ML IV SOLN
2000.0000 mg | Freq: Once | INTRAVENOUS | Status: AC
  Administered 2022-10-25: 2000 mg via INTRAVENOUS
  Filled 2022-10-25: qty 400

## 2022-10-25 MED ORDER — HEPARIN BOLUS VIA INFUSION
4000.0000 [IU] | Freq: Once | INTRAVENOUS | Status: AC
  Administered 2022-10-25: 4000 [IU] via INTRAVENOUS
  Filled 2022-10-25: qty 4000

## 2022-10-25 MED ORDER — LACTATED RINGERS IV BOLUS: 1000.0000 mL | Freq: Once | INTRAVENOUS | Status: DC

## 2022-10-25 MED ORDER — CHLORHEXIDINE GLUCONATE CLOTH 2 % EX PADS
6.0000 | MEDICATED_PAD | Freq: Every day | CUTANEOUS | Status: DC
  Administered 2022-10-25 – 2022-11-07 (×15): 6 via TOPICAL

## 2022-10-25 MED ORDER — POLYETHYLENE GLYCOL 3350 17 G PO PACK: 17.0000 g | PACK | Freq: Every day | ORAL | Status: DC | PRN

## 2022-10-25 MED ORDER — ASPIRIN 81 MG PO CHEW: 324.0000 mg | CHEWABLE_TABLET | Freq: Once | ORAL | Status: DC

## 2022-10-25 MED ORDER — ACETAMINOPHEN 325 MG PO TABS: 650.0000 mg | ORAL_TABLET | ORAL | Status: DC | PRN

## 2022-10-25 MED ORDER — LACTATED RINGERS IV BOLUS
1000.0000 mL | Freq: Once | INTRAVENOUS | Status: AC
  Administered 2022-10-25: 1000 mL via INTRAVENOUS

## 2022-10-25 MED ORDER — VANCOMYCIN HCL IN DEXTROSE 1-5 GM/200ML-% IV SOLN: 1000.0000 mg | Freq: Once | INTRAVENOUS | Status: DC

## 2022-10-25 MED ORDER — SODIUM BICARBONATE 8.4 % IV SOLN
50.0000 meq | Freq: Once | INTRAVENOUS | Status: AC
  Administered 2022-10-25: 50 meq via INTRAVENOUS
  Filled 2022-10-25: qty 50

## 2022-10-25 MED ORDER — NOREPINEPHRINE 4 MG/250ML-% IV SOLN
2.0000 ug/min | INTRAVENOUS | Status: DC
  Administered 2022-10-26: 18 ug/min via INTRAVENOUS
  Filled 2022-10-25 (×2): qty 250

## 2022-10-25 MED ORDER — DOCUSATE SODIUM 100 MG PO CAPS: 100.0000 mg | ORAL_CAPSULE | Freq: Two times a day (BID) | ORAL | Status: DC | PRN

## 2022-10-25 MED ORDER — NOREPINEPHRINE 4 MG/250ML-% IV SOLN
0.0000 ug/min | INTRAVENOUS | Status: DC
  Administered 2022-10-25: 2 ug/min via INTRAVENOUS
  Filled 2022-10-25: qty 250

## 2022-10-25 MED ORDER — ORAL CARE MOUTH RINSE: 15.0000 mL | OROMUCOSAL | Status: DC | PRN

## 2022-10-25 NOTE — ED Provider Notes (Incomplete)
[facility] EMERGENCY DEPARTMENT AT [facility] Provider Note   CSN: [id number] Arrival date & time: 10/25/22  1511     History {Add pertinent medical, surgical, social history, OB history to HPI:1} Chief Complaint  Patient presents with  . Shortness of Breath    Troy Johnston is a 72 y.o. male.  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  Has been feeling worse since Friday.  Says that he has become generally weaker.  Been having some shortness of breath.  No chest pain or cough or fevers.  No melena or hematochezia.  His nephew found him today leaning over a counter short of breath asking him to call 911.  When EMS arrived he was satting well on room air but was placed on a nonrebreather for his work of breathing.  No cardiac or pulmonary history.  Not on blood thinners.       Home Medications Prior to Admission medications   Medication Sig Start Date End Date Taking? Authorizing Provider  allopurinol (ZYLOPRIM) 100 MG tablet Take 50 mg by mouth every morning. For high uric acid level    [provider]  Alogliptin Benzoate 12.5 MG TABS Take 6.25 mg by mouth every morning. For diabetes (replaces metformin)    [provider]  atorvastatin (LIPITOR) 20 MG tablet Take 20 mg by mouth at bedtime. For cholesterol    [provider]  Carboxymethylcellulose Sodium 0.25 % SOLN Place 1 drop into both eyes 3 (three) times daily.    [provider]  Cholecalciferol (VITAMIN D) 50 MCG (2000 UT) tablet Take 4,000 Units by mouth in the morning.    [provider]  ciprofloxacin (CIPRO) 500 MG tablet Take 1 tablet (500 mg total) by mouth every 12 (twelve) hours. 06/14/20   Arthor Captain, PA-C  ferrous sulfate 325 (65 FE) MG tablet Take 325 mg by mouth See admin instructions. Take one tablet  (325 mg) by mouth three days week - Monday, Wednesday, Thursday    [provider]  glipiZIDE (GLUCOTROL) 10 MG tablet Take 10 mg by mouth 2 (two) times daily before a meal. For diabetes    [provider]  hydrALAZINE (APRESOLINE) 100 MG tablet Take 100 mg by mouth every 8 (eight) hours.    [provider]  lisinopril (ZESTRIL) 10 MG tablet Take 10 mg by mouth in the morning.    [provider]  metroNIDAZOLE (FLAGYL) 500 MG tablet Take 1 tablet (500 mg total) by mouth 2 (two) times daily. 06/14/20   Harris, Cammy Copa, PA-C  oxyCODONE (ROXICODONE) 5 MG immediate release tablet Take 0.5-1 tablets (2.5-5 mg total) by mouth every 6 (six) hours as needed for severe pain. 06/14/20   Arthor Captain, PA-C      Allergies    Penicillins    Review of Systems   Review of Systems  Physical Exam Updated Vital Signs BP 98/71 (BP Location: Right Arm)   Pulse 94   Temp 98.5 F (36.9 C) (Oral)   Resp (!) 49   Ht 5\' 8"  (1.727 m)   Wt 95 kg   SpO2 100%   BMI 31.84 kg/m  Physical Exam Vitals and nursing note reviewed.  Constitutional:      General: He is in acute distress.     Appearance: He is well-developed. He is ill-appearing.  Comments: Alert and oriented x 4  HENT:     Head: Normocephalic and atraumatic.     Right Ear: External ear normal.     Left Ear: External ear normal.     Nose: Nose normal.  Eyes:     Extraocular Movements: Extraocular movements intact.     Conjunctiva/sclera: Conjunctivae normal.     Pupils: Pupils are equal, round, and reactive to light.  Cardiovascular:     Rate and Rhythm: Normal rate and regular rhythm.     Heart sounds: Normal heart sounds.  Pulmonary:     Effort: Respiratory distress present.     Breath sounds: Rales (Bibasilar Rales) present.  Abdominal:     General: There is distension.     Palpations: Abdomen is soft. There is no mass.     Tenderness: There is no abdominal tenderness. There is no guarding.   Musculoskeletal:     Cervical back: Normal range of motion and neck supple.     Right lower leg: No edema.     Left lower leg: No edema.  Skin:    General: Skin is warm and dry.  Neurological:     Mental Status: He is alert. Mental status is at baseline.  Psychiatric:        Mood and Affect: Mood normal.        Behavior: Behavior normal.     ED Results / Procedures / Treatments   Labs (all labs ordered are listed, but only abnormal results are displayed) Labs Reviewed  I-STAT CHEM 8, ED - Abnormal; Notable for the following components:      Result Value   BUN 83 (*)    Creatinine, Ser 8.70 (*)    Calcium, Ion 0.82 (*)    TCO2 13 (*)    All other components within normal limits  I-STAT VENOUS BLOOD GAS, ED - Abnormal; Notable for the following components:   pCO2, Ven 23.3 (*)    pO2, Ven 104 (*)    Bicarbonate 12.2 (*)    TCO2 13 (*)    Acid-base deficit 12.0 (*)    Calcium, Ion 0.80 (*)    HCT 31.0 (*)    Hemoglobin 10.5 (*)    All other components within normal limits  RESP PANEL BY RT-PCR (RSV, FLU A&B, COVID)  RVPGX2  CULTURE, BLOOD (ROUTINE X 2)  CULTURE, BLOOD (ROUTINE X 2)  COMPREHENSIVE METABOLIC PANEL  CBC  BRAIN NATRIURETIC PEPTIDE  BLOOD GAS, VENOUS  LACTIC ACID, PLASMA  LACTIC ACID, PLASMA  URINALYSIS, W/ REFLEX TO CULTURE (INFECTION SUSPECTED)  TROPONIN I (HIGH SENSITIVITY)    EKG EKG Interpretation Date/Time:  Tuesday October 25 2022 15:20:43 EDT Ventricular Rate:  100 PR Interval:    QRS Duration:  99 QT Interval:  332 QTC Calculation: 429 R Axis:   -24  Text Interpretation: Sinus tachycardia Left ventricular hypertrophy Borderline T abnormalities, inferior leads interpreation limited due to baseline artifact Confirmed by Vonita Moss [phone]) on 10/25/2022 3:29:46 PM  Radiology No results found.  Procedures Procedures  {Document cardiac monitor, telemetry assessment procedure when appropriate:1}  Medications Ordered in ED Medications   norepinephrine (LEVOPHED) 4mg  in (0.016 mg/mL) premix infusion (has no administration in time range)  lactated ringers bolus 1,000 mL (has no administration in time range)  aztreonam (AZACTAM) 2 g in sodium chloride 0.9 % 100 mL IVPB (has no administration in time range)  metroNIDAZOLE (FLAGYL) IVPB 500 mg (has no administration in time range)  vancomycin (VANCOCIN) IVPB  1000 mg/200 mL premix (has no administration in time range)    ED Course/ Medical Decision Making/ A&P Clinical Course as of 10/25/22 1622  Tue Oct 25, 2022  1622 Hemoglobin(!): 10.4 At baseline [RP]  1622 Platelets(!): 74 New thrombocytopenia [RP]    Clinical Course User Index [RP] Rondel Baton, MD   {   Click here for ABCD2, HEART and other calculatorsREFRESH Note before signing :1}                          Medical Decision Making Amount and/or Complexity of Data Reviewed Labs: ordered. Radiology: ordered.  Risk Prescription drug management.   ***  {Document critical care time when appropriate:1} {Document review of labs and clinical decision tools ie heart score, Chads2Vasc2 etc:1}  {Document your independent review of radiology images, and any outside records:1} {Document your discussion with family members, caretakers, and with consultants:1} {Document social determinants of health affecting pt's care:1} {Document your decision making why or why not admission, treatments were needed:1} Final Clinical Impression(s) / ED Diagnoses Final diagnoses:  None    Rx / DC Orders ED Discharge Orders     None

## 2022-10-25 NOTE — Progress Notes (Addendum)
ANTICOAGULATION CONSULT NOTE - Initial Consult  Pharmacy Consult for Heparin Indication: chest pain/ACS  Allergies  Allergen Reactions   Penicillins Hives and Other (See Comments)    Has patient had a PCN reaction causing immediate rash, facial/tongue/throat swelling, SOB or lightheadedness with hypotension: no Has patient had a PCN reaction causing severe rash involving mucus membranes or skin necrosis: no Has patient had a PCN reaction that required hospitalization: yes Has patient had a PCN reaction occurring within the last 10 years: no If all of the above answers are "NO", then may proceed with Cephalosporin use.    Patient Measurements: Height: 5\' 8"  (172.7 cm) Weight: 95 kg (209 lb 7 oz) IBW/kg (Calculated) : 68.4 Heparin Dosing Weight: 88.3 kg  Vital Signs: Temp: 97.7 F (36.5 C) (07/23 1640) Temp Source: Oral (07/23 1640) BP: 119/86 (07/23 1645) Pulse Rate: 96 (07/23 1645)  Labs: Recent Labs    10/25/22 1527 10/25/22 1539 10/25/22 1551  HGB 10.4* 10.5* 15.6  HCT 35.1* 31.0* 46.0  PLT 74*  --   --   CREATININE 8.02*  --  8.70*  TROPONINIHS 2,879*  --   --     Estimated Creatinine Clearance: 8.4 mL/min (A) (by C-G formula based on SCr of 8.7 mg/dL (H)).   Medical History: Past Medical History:  Diagnosis Date   Diabetes mellitus without complication ([facility])    GERD (gastroesophageal reflux disease)    Hypertension    Prostate cancer ([facility])     Medications:  (Not in a hospital admission)  Scheduled:  Infusions:   aztreonam     metronidazole     norepinephrine (LEVOPHED) Adult infusion Stopped (10/25/22 1637)   sodium bicarbonate 150 mEq in dextrose 5 % 1,150 mL infusion     vancomycin     PRN:   Assessment: 71 yom with a history of DM, prostate cancer remission, PUD and GIB. Patient is presenting with SOB. Heparin per pharmacy consult placed for chest pain/ACS. Per EDP, PE is still on the differential.  Patient is not on anticoagulation prior to  arrival.  Hgb 15.6; plt 74 - d/w MD [place]  Goal of Therapy:  Heparin level 0.3-0.7 units/ml Monitor platelets by anticoagulation protocol: Yes   Plan:  Give IV heparin 4000 units bolus x 1 Start heparin infusion at 1100 units/hr Check anti-Xa level in 8 hours and daily while on heparin Continue to monitor H&H and platelets  Delmar Landau, PharmD, BCPS 10/25/2022 4:53 PM ED Clinical Pharmacist -  [phone]

## 2022-10-25 NOTE — Progress Notes (Signed)
eLink Physician-Brief Progress Note Patient Name: Troy Johnston DOB: 03/15/1951 MRN: [id number]   Date of Service  10/25/2022  HPI/Events of Note  72 year old male with a history metabolic syndrome, chronic kidney disease stage IV, and diabetes who initially presented with shortness of breath found to be in shock.  Initially hypotensive and tachypneic started on 6 L nasal cannula and norepinephrine.  Received broad-spectrum antibiotics.  Laboratory studies show metabolic acidosis with 70% central venous sat.  Metabolic panel with worse azotemia, troponin elevation, lactic acidosis.  Mild right-sided hydronephrosis noted on renal ultrasound.  eICU Interventions  Continue with sodium bicarb  Continue with norepinephrine through peripheral access  Consider nighttime BiPAP  Troponin downtrending.  Add on lactic acid and VBG for the morning.  GI prophylaxis held DVT prophylaxis with heparin   0210 -over the past 20 minutes, he has been having escalating hypotension now requiring up to 17 mcg of peripheral norepinephrine.  Still mentating appropriately, no respiratory failure.  Stable on BiPAP.  Will push 1 ampoule of bicarb, 1 L of LR, increase ceiling of peripheral norepinephrine.  Request arterial line and repeat ABG.  Anticipate that the patient is becoming progressively more acidotic. Had moderate response in BP in trendelenburg positioning. Morning labs are pending.  0300 -has persistent shock, some variability between A-line and cuff pressures.  Will titrate to SBP goal greater than 100.  Orders updated to reflect this.  0630 -multi pressor shock, pending BMP this morning, but I feel it is safe for to proceed with trialysis line regardless even if the patient does not progress to RRT. Requested ground team eval  0430 -thrombocytopenic but remains above threshold to maintain heparin.  Stop drip if platelets less than 50.  0524 -central line placed in right IJ, will place chest  radiograph ordered to verify positioning.  1601 -central line okay to use.  Has been anxious and tachycardic up to 140s.  Will add on amiodarone drip.  Intervention Category Evaluation Type: New Patient Evaluation  Palmer Shorey 10/25/2022, 10:10 PM

## 2022-10-25 NOTE — Progress Notes (Signed)
Pt placed on BIPAP to rest per order. Pt tolerating well at this time   10/25/22 2342  BiPAP/CPAP/SIPAP  $ Non-Invasive Ventilator  Non-Invasive Vent Set Up;Non-Invasive Vent Initial  $ Face Mask Large  Yes  BiPAP/CPAP/SIPAP Pt Type Adult  BiPAP/CPAP/SIPAP SERVO (servo air)  Mask Type Full face mask  Mask Size Large  Set Rate  (bur 12)  Respiratory Rate 23 breaths/min  IPAP 8 cmH20  EPAP 5 cmH2O  Pressure Support 8 cmH20  PEEP 5 cmH20  FiO2 (%) 60 %  Minute Ventilation 13.9  Leak 24  Peak Inspiratory Pressure (PIP) 13  Tidal Volume (Vt) 632  Patient Home Equipment No  Auto Titrate No  Press High Alarm 30 cmH2O  Press Low Alarm 5 cmH2O  BiPAP/CPAP /SiPAP Vitals  Pulse Rate (!) 103  Resp (!) 30  SpO2 96 %  MEWS Score/Color  MEWS Score 4  MEWS Score Color Red

## 2022-10-25 NOTE — H&P (Signed)
NAME:  Troy Johnston, MRN:  [id number], DOB:  1950-04-26, LOS: 0 ADMISSION DATE:  10/25/2022, CONSULTATION DATE:  10/25/2022  REFERRING MD:  Eloise Harman, EDP, CHIEF COMPLAINT: Shortness of breath  History of Present Illness:  72 year old diabetic, hypertensive, with CKD baseline creatinine 2.4 brought in by EMS after nephew found him with significant shortness of breath.  He reports symptoms for 3 days, no chest pain or dizziness.  No cough or URI symptoms.  Reports making urine including today. Initial labs significant for BUN/creatinine 91/8.0, bicarbonate 10, elevated LFTs, no leukocytosis, lactate 6.8, hemoglobin 10.4 [chronic], platelets 74 K He was placed on nonrebreather initially even though saturation was normal and weaned down to nasal cannula. 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 rate (!) 39, height 5\' 8"  (1.727 m), weight 95 kg, SpO2 97%.       No intake or output data in the 24 hours ending 10/25/22 1802 Filed Weights   10/25/22 1543  Weight: 95 kg    Examination: General: Well-built, well-nourished elderly man, mild distress due to tachypnea HENT: Mild pallor, no icterus, no JVD, dry mucosa Lungs: Crackles left base, mild accessory muscle use, no rhonchi Cardiovascular: S1-S2 regular, no murmur Abdomen: Soft, obese, nontender, no guarding Extremities: 1+ edema, no  deformity Neuro: Alert oriented, interactive, nonfocal, mild asterixis  Bedside ultrasound shows mildly depressed LV function, no pericardial or pleural Effusion, poor windows  Resolved Hospital Problem list     Assessment & Plan:  Known CKD stage IV with baseline creatinine of 2.[facility], on ACE inhibitor now admitted with progressive renal failure and severe metabolic acidosis, unclear etiology.  Elevated troponin suspicious for cardiac cause and window he did not have classic chest pain presentation.  Shock, septic versus cardiogenic -Await formal echocardiogram -Seen by cardiology started on IV heparin, troponin elevated but flat, not a candidate for immediate catheterization due to renal failure -Use Levophed peripheral for now, blood pressure improved at low-dose, may need central access if requires more pressors or for co-oximetry -Agree with empiric antibiotics although no source apparent -aztreonam chosen since remoteh/ o PCN allergy -Repeat lactate shows mild improvement, received 1 L of fluids will hold off further fluids due to bibasilar crackles  Elevated troponin/non-STEMI -Given aspirin and IV heparin per cardiology  AKI on CKD stage IV Metabolic acidosis -Check renal ultrasound given history of prostate cancer -Bicarbonate drip at 150/hour -Repeat BMET every 8 hours -Replete hypocalcemia, no indication for dialysis at present but may need if renal function/acidosis does not improve    Best Practice (right click and "Reselect all SmartList Selections" daily)   Diet/type: clear liquids DVT prophylaxis: systemic heparin GI prophylaxis: N/A Lines: N/A Foley:  N/A Code Status:  full code Last date of multidisciplinary goals of care discussion [discussed with patient and nephew, full CODE STATUS]  Labs   CBC: Recent Labs  Lab 10/25/22 1527 10/25/22 1539  10/25/22 1551  WBC 6.7  --   --   HGB 10.4* 10.5* 15.6  HCT 35.1* 31.0* 46.0  MCV 90.0   --   --   PLT 74*  --   --     Basic Metabolic Panel: Recent Labs  Lab 10/25/22 1527 10/25/22 1539 10/25/22 1551  NA 140 137 138  K 4.1 4.1 4.0  CL 102  --  109  CO2 10*  --   --   GLUCOSE 103*  --  98  BUN 91*  --  83*  CREATININE 8.02*  --  8.70*  CALCIUM 7.7*  --   --    GFR: Estimated Creatinine Clearance: 8.4 mL/min (A) (by C-G formula based on SCr of 8.7 mg/dL (H)). Recent Labs  Lab 10/25/22 1527 10/25/22 1614 10/25/22 1658  WBC 6.7  --   --   LATICACIDVEN  --  6.8* 5.7*    Liver Function Tests: Recent Labs  Lab 10/25/22 1527  AST 163*  ALT 59*  ALKPHOS 133*  BILITOT 0.8  PROT 6.7  ALBUMIN 2.9*   No results for input(s): "LIPASE", "AMYLASE" in the last 168 hours. No results for input(s): "AMMONIA" in the last 168 hours.  ABG    Component Value Date/Time   HCO3 12.2 (L) 10/25/2022 1539   TCO2 13 (L) 10/25/2022 1551   ACIDBASEDEF 12.0 (H) 10/25/2022 1539   O2SAT 98 10/25/2022 1539     Coagulation Profile: No results for input(s): "INR", "PROTIME" in the last 168 hours.  Cardiac Enzymes: No results for input(s): "CKTOTAL", "CKMB", "CKMBINDEX", "TROPONINI" in the last 168 hours.  HbA1C: No results found for: "HGBA1C"  CBG: No results for input(s): "GLUCAP" in the last 168 hours.  Review of Systems:   Constitutional: negative for anorexia, fevers and sweats  Eyes: negative for irritation, redness and visual disturbance  Ears, nose, mouth, throat, and face: negative for earaches, epistaxis, nasal congestion and sore throat  Respiratory: negative for cough,  sputum and wheezing  Cardiovascular: negative for chest pain, orthopnea, palpitations and syncope  Gastrointestinal: negative for abdominal pain, constipation, diarrhea, melena, nausea and vomiting  Genitourinary:negative for dysuria, frequency and hematuria  Hematologic/lymphatic: negative for bleeding, easy bruising and lymphadenopathy  Musculoskeletal:negative for arthralgias, muscle  weakness and stiff joints  Neurological: negative for coordination problems, gait problems, headaches and weakness  Endocrine: negative for diabetic symptoms including polydipsia, polyuria and weight loss   Past Medical History:  He,  has a past medical history of Diabetes mellitus without complication (HCC), GERD (gastroesophageal reflux disease), Hypertension, and Prostate cancer (HCC).   Surgical History:  No past surgical history on file.   Social History:   reports that he has never smoked. He does not have any smokeless tobacco history on file. He reports that he does not drink alcohol and does not use drugs.   Family History:  His family history is not on file.   Allergies Allergies  Allergen Reactions   Penicillins Hives and Other (See Comments)    Has patient had a PCN reaction causing immediate rash, facial/tongue/throat swelling, SOB or lightheadedness with hypotension: no Has patient had a PCN reaction causing severe rash involving mucus membranes or skin necrosis: no Has patient had a PCN reaction that required hospitalization: yes Has patient had a PCN reaction occurring within the last 10 years: no If all of the above answers are "NO", then may proceed with Cephalosporin use.     Home Medications  Prior to Admission medications  Medication Sig Start Date End Date Taking? Authorizing Provider  carvedilol (COREG) 25 MG tablet Take 25 mg by mouth 2 (two) times daily with a meal. 07/08/22  Yes [provider]  allopurinol (ZYLOPRIM) 100 MG tablet Take 50 mg by mouth every morning. For high uric acid level    [provider]  Alogliptin Benzoate 12.5 MG TABS Take 6.25 mg by mouth every morning. For diabetes (replaces metformin)    [provider]  atorvastatin (LIPITOR) 20 MG tablet Take 20 mg by mouth at bedtime. For cholesterol    [provider]  Carboxymethylcellulose Sodium 0.25 % SOLN Place 1 drop into both eyes 3 (three) times  daily.    [provider]  Cholecalciferol (VITAMIN D) 50 MCG (2000 UT) tablet Take 4,000 Units by mouth in the morning.    [provider]  ciprofloxacin (CIPRO) 500 MG tablet Take 1 tablet (500 mg total) by mouth every 12 (twelve) hours. 06/14/20   Arthor Captain, PA-C  ferrous sulfate 325 (65 FE) MG tablet Take 325 mg by mouth See admin instructions. Take one tablet (325 mg) by mouth three days week - Monday, Wednesday, Thursday    [provider]  glipiZIDE (GLUCOTROL) 10 MG tablet Take 10 mg by mouth 2 (two) times daily before a meal. For diabetes    [provider]  hydrALAZINE (APRESOLINE) 100 MG tablet Take 100 mg by mouth every 8 (eight) hours.    [provider]  lisinopril (ZESTRIL) 10 MG tablet Take 10 mg by mouth in the morning.    [provider]  metroNIDAZOLE (FLAGYL) 500 MG tablet Take 1 tablet (500 mg total) by mouth 2 (two) times daily. 06/14/20   Harris, Cammy Copa, PA-C  oxyCODONE (ROXICODONE) 5 MG immediate release tablet Take 0.5-1 tablets (2.5-5 mg total) by mouth every 6 (six) hours as needed for severe pain. 06/14/20   Arthor Captain, PA-C     Critical care time: 55 m       Cyril Mourning MD. Tonny Bollman. [facility] Pager : [phone]  If no response to pager , please call [phone] until 7 pm After 7:00 pm call Elink  [phone]   10/25/2022

## 2022-10-25 NOTE — Consult Note (Signed)
Cardiology Consultation   Patient ID: MURAT RIDEOUT MRN: [id number]; DOB: January 30, 1951  Admit date: 10/25/2022 Date of Consult: 10/25/2022  PCP:  Patient, No Pcp Per   [facility] Providers Cardiologist:  None        Patient Profile:   LYNDEN FLEMMER is a 72 y.o. male with a hx of diabetes mellitus, prostate cancer in remission, peptic ulcer disease, history of GI bleed and CKD who is being seen 10/25/2022 for the evaluation of elevated troponin and shortness of breath at the request of Dr. Eloise Harman.  History of Present Illness:   Mr. Linden reports that over the last several days starting on Friday he started to experience increasing shortness of breath.  He notes that he did not think much about it because sometimes he have these days.  But then for the weekend he grew weaker and weaker and her shortness of breath got worse.  He then started to sleep sitting up.  His nephew who lives with him was out of town and came back today and noticed that he was significantly short of breath and called 911.  During his evaluation in the ED he was noted to be significantly short of breath requiring supplemental oxygen.  During his workup he was noted to have elevated troponin, significantly elevated creatinine at 8 and increased lactate, noted to be in shock and has been started on Levophed drip.  Cardiology has been consulted to evaluate the patient for his elevated troponin.  Past Medical History:  Diagnosis Date   Diabetes mellitus without complication (HCC)    GERD (gastroesophageal reflux disease)    Hypertension    Prostate cancer (HCC)     No past surgical history on file.   Home Medications:  Prior to Admission medications   Medication Sig Start Date End Date Taking? Authorizing Provider  carvedilol (COREG) 25 MG tablet Take 25 mg by mouth 2 (two) times daily with a meal. 07/08/22  Yes [provider]  allopurinol (ZYLOPRIM) 100 MG tablet Take 50 mg by  mouth every morning. For high uric acid level    [provider]  Alogliptin Benzoate 12.5 MG TABS Take 6.25 mg by mouth every morning. For diabetes (replaces metformin)    [provider]  atorvastatin (LIPITOR) 20 MG tablet Take 20 mg by mouth at bedtime. For cholesterol    [provider]  Carboxymethylcellulose Sodium 0.25 % SOLN Place 1 drop into both eyes 3 (three) times daily.    [provider]  Cholecalciferol (VITAMIN D) 50 MCG (2000 UT) tablet Take 4,000 Units by mouth in the morning.    [provider]  ciprofloxacin (CIPRO) 500 MG tablet Take 1 tablet (500 mg total) by mouth every 12 (twelve) hours. 06/14/20   Arthor Captain, PA-C  ferrous sulfate 325 (65 FE) MG tablet Take 325 mg by mouth See admin instructions. Take one tablet (325 mg) by mouth three days week - Monday, Wednesday, Thursday    [provider]  glipiZIDE (GLUCOTROL) 10 MG tablet Take 10 mg by mouth 2 (two) times daily before a meal. For diabetes    [provider]  hydrALAZINE (APRESOLINE) 100 MG tablet Take 100 mg by mouth every 8 (eight) hours.    [provider]  lisinopril (ZESTRIL) 10 MG tablet Take 10 mg by mouth in the morning.    [provider]  metroNIDAZOLE (FLAGYL) 500 MG tablet Take 1 tablet (500 mg total) by mouth 2 (two) times  daily. 06/14/20   Arthor Captain, PA-C  oxyCODONE (ROXICODONE) 5 MG immediate release tablet Take 0.5-1 tablets (2.5-5 mg total) by mouth every 6 (six) hours as needed for severe pain. 06/14/20   Arthor Captain, PA-C    Inpatient Medications: Scheduled Meds:  heparin  4,000 Units Intravenous Once   Continuous Infusions:  aztreonam     heparin     norepinephrine (LEVOPHED) Adult infusion Stopped (10/25/22 1637)   sodium bicarbonate 150 mEq in dextrose 5 % 1,150 mL infusion 100 mL/hr at 10/25/22 1728   vancomycin     PRN Meds:   Allergies:    Allergies  Allergen Reactions   Penicillins Hives  and Other (See Comments)    Has patient had a PCN reaction causing immediate rash, facial/tongue/throat swelling, SOB or lightheadedness with hypotension: no Has patient had a PCN reaction causing severe rash involving mucus membranes or skin necrosis: no Has patient had a PCN reaction that required hospitalization: yes Has patient had a PCN reaction occurring within the last 10 years: no If all of the above answers are "NO", then may proceed with Cephalosporin use.    Social History:   Social History   Socioeconomic History   Marital status: Single    Spouse name: Not on file   Number of children: Not on file   Years of education: Not on file   Highest education level: Not on file  Occupational History   Not on file  Tobacco Use   Smoking status: Never   Smokeless tobacco: Not on file  Substance and Sexual Activity   Alcohol use: No   Drug use: No   Sexual activity: Not on file  Other Topics Concern   Not on file  Social History Narrative   Not on file   Social Determinants of Health   Financial Resource Strain: Not on file  Food Insecurity: Not on file  Transportation Needs: Not on file  Physical Activity: Not on file  Stress: Not on file  Social Connections: Not on file  Intimate Partner Violence: Not on file    Family History:   No family history on file.   ROS:  Please see the history of present illness.  Shortness of breath, generalized malaise All other ROS reviewed and negative.     Physical Exam/Data:   Vitals:   10/25/22 1615 10/25/22 1630 10/25/22 1640 10/25/22 1645  BP: (!) 105/37 (!) 127/52  119/86  Pulse: 90 97  96  Resp: (!) 47 (!) 53  (!) 40  Temp:   97.7 F (36.5 C)   TempSrc:   Oral   SpO2: 100% 100%  100%  Weight:      Height:       No intake or output data in the 24 hours ending 10/25/22 1728    10/25/2022    3:43 PM 11/13/2019    4:06 PM 10/31/2017    9:49 AM  Last 3 Weights  Weight (lbs) 209 lb 7 oz 210 lb 210 lb  Weight (kg)  95 kg 95.255 kg 95.255 kg     Body mass index is 31.84 kg/m.  General:  Well nourished, well developed, in no acute distress HEENT: normal Neck: no JVD Vascular: No carotid bruits; Distal pulses 2+ bilaterally Cardiac:  normal S1, S2; RRR; no murmur  Lungs: Bibasilar crackles, no wheezing, rhonchi or rales  Abd: soft, nontender, no hepatomegaly  Ext: +1 edema Musculoskeletal:  No deformities, BUE and BLE strength normal and equal Skin: warm  and dry  Neuro:  CNs 2-12 intact, no focal abnormalities noted Psych:  Normal affect   EKG:  The EKG was personally reviewed and demonstrates: Sinus tachycardia with changes T wave inversion in the inferior leads. Telemetry:  Telemetry was personally reviewed and demonstrates: Sinus rhythm to sinus tachycardia on the monitor  Relevant CV Studies: None to review  Laboratory Data:  High Sensitivity Troponin:   Recent Labs  Lab 10/25/22 1527  TROPONINIHS 2,879*     Chemistry Recent Labs  Lab 10/25/22 1527 10/25/22 1539 10/25/22 1551  NA 140 137 138  K 4.1 4.1 4.0  CL 102  --  109  CO2 10*  --   --   GLUCOSE 103*  --  98  BUN 91*  --  83*  CREATININE 8.02*  --  8.70*  CALCIUM 7.7*  --   --   GFRNONAA 7*  --   --   ANIONGAP 28*  --   --     Recent Labs  Lab 10/25/22 1527  PROT 6.7  ALBUMIN 2.9*  AST 163*  ALT 59*  ALKPHOS 133*  BILITOT 0.8   Lipids No results for input(s): "CHOL", "TRIG", "HDL", "LABVLDL", "LDLCALC", "CHOLHDL" in the last 168 hours.  Hematology Recent Labs  Lab 10/25/22 1527 10/25/22 1539 10/25/22 1551  WBC 6.7  --   --   RBC 3.90*  --   --   HGB 10.4* 10.5* 15.6  HCT 35.1* 31.0* 46.0  MCV 90.0  --   --   MCH 26.7  --   --   MCHC 29.6*  --   --   RDW 14.2  --   --   PLT 74*  --   --    Thyroid No results for input(s): "TSH", "FREET4" in the last 168 hours.  BNP Recent Labs  Lab 10/25/22 1528  BNP 719.5*    DDimer No results for input(s): "DDIMER" in the last 168  hours.   Radiology/Studies:  DG Chest Port 1 View  Result Date: 10/25/2022 CLINICAL DATA:  Shortness of breath. EXAM: PORTABLE CHEST 1 VIEW COMPARISON:  11/14/2019. FINDINGS: Enlarged cardiac silhouette. Low lung volume study with possible retrocardiac opacities. No visible pleural effusions or pneumothorax. No acute bony abnormality. Left shoulder osteoarthritis. IMPRESSION: 1. Low lung volume study with possible retrocardiac opacities, most likely atelectasis. If there is concern for infection, dedicated PA and lateral radiographs could better assess. 2. Cardiomegaly. Electronically Signed   By: Feliberto Harts M.D.   On: 10/25/2022 16:36     Assessment and Plan:   Shock: Septic versus cardiogenic-clinically he appears to be in multiorgan failure with his elevated creatinine, significantly elevated lactate and hypotension.  He has been started on Levophed MAP goal greater than 65 and broad-spectrum antibiotics agree with this.  He will be appropriate to be admitted in the ICU as we follow along his medical care.  Elevated troponin/NSTEMI in the setting of his shock.  He does have risk factors for coronary artery disease but his clinical condition suggest demand ischemia.  Please start heparin drip and will continue to monitor the patient will get an echocardiogram to assess LV function.  For now he will not benefit from any invasive ischemic evaluation given his multiorgan failure especially his kidney function this needs evaluated by nephrology as he may likely require renal replacement therapy given his worsening creatinine as well as clinically volume overload.  With this in mind it would be beneficial to have nephrology see  the patient early.  Elevated LFTs-likely this is ischemic due to related hypotension and multiorgan failure.  Continue to trend.    Risk Assessment/Risk Scores:     TIMI Risk Score for Unstable Angina or Non-ST Elevation MI:   The patient's TIMI risk score is  ,  which indicates a  % risk of all cause mortality, new or recurrent myocardial infarction or need for urgent revascularization in the next 14 days.      For questions or updates, please contact [facility] Please consult [email or web address] for contact info under    SignedThomasene Ripple, DO  10/25/2022 5:28 PM

## 2022-10-25 NOTE — Sepsis Progress Note (Signed)
Notified provider of need to order fluid bolus.  ?

## 2022-10-25 NOTE — ED Notes (Signed)
Trop is [id number] MD notified

## 2022-10-25 NOTE — Consult Note (Signed)
[facility]  HISTORY AND PHYSICAL  Troy Johnston is an 72 y.o. male.    Chief Complaint: SOB  HPI: PT is a 76M with a PMH sig for HTN, DM II, CKD, h/o prostate cancer who is now seen in consultation at the request of Dr [name] for SOB.  Pt was in his usual state of health until 3-4 days ago when he started having increased, progressive SOB.  Got to the point where he was bending over the sink today, unable to breathe, and pt's nephew called 911 at that point.    Was placed on NRB d/t WOB.  Code sepsis called.  Given vanc, aztreonam, Flagyl.  NO other symptoms- burning with urination, n/v, diarrhea, blood in urine/stool, CP, cough.  Trops elevated in the ED, cardiology c/s, on hep gtt.  Pressures low, on peripheral levophed.  Elevated lactate.    Baseline Cr is 2.4, up to 8 now.  K OK, bicarb low.  On ACEi/ no metformin or SGLT2i.  Nephew going to bring meds from home.   PMH: Past Medical History:  Diagnosis Date   Diabetes mellitus without complication (HCC)    GERD (gastroesophageal reflux disease)    Hypertension    Prostate cancer (HCC)    PSH: No past surgical history on file.    Past Medical History:  Diagnosis Date   Diabetes mellitus without complication (HCC)    GERD (gastroesophageal reflux disease)    Hypertension    Prostate cancer (HCC)     Medications: Carvedilol Allopurinol Alogliptin Atrovastatin Vit D Ferrous sulfate Glipizide Hyralazine lisinopril  ALLERGIES:   Allergies  Allergen Reactions   Penicillins Hives and Other (See Comments)    Has patient had a PCN reaction causing immediate rash, facial/tongue/throat swelling, SOB or lightheadedness with hypotension: no Has patient had a PCN reaction causing severe rash involving mucus membranes or skin necrosis: no Has patient had a PCN reaction that required hospitalization: yes Has patient had a PCN reaction occurring within the last 10 years: no If all of the above answers are  "NO", then may proceed with Cephalosporin use.    FAM HX: No family history on file.  Social History:   reports that he has never smoked. He does not have any smokeless tobacco history on file. He reports that he does not drink alcohol and does not use drugs.  ROS: ROS: all other systems reviewed and are negative except as per HPI  Blood pressure (!) 110/50, pulse 88, temperature 97.7 F (36.5 C), temperature source Oral, resp. rate (!) 44, height 5\' 8"  (1.727 m), weight 95 kg, SpO2 96%. PHYSICAL EXAM: Physical Exam GEN ill-appearing, sitting in bed HEENT EOMI PERRL NECK + JVD PULM bibasilar crackles CV tachycardic ABD protuberant EXT warm, not a lot of LE edema NEURO Sleepy-ish but AAOx 3 SKIN a little sweaty MSK L ankel in brace   Results for orders placed or performed during the hospital encounter of 10/25/22 (from the past 48 hour(s))  Comprehensive metabolic panel     Status: Abnormal   Collection Time: 10/25/22  3:27 PM  Result Value Ref Range   Sodium 140 135 - 145 mmol/L   Potassium 4.1 3.5 - 5.1 mmol/L   Chloride 102 98 - 111 mmol/L   CO2 10 (L) 22 - 32 mmol/L   Glucose, Bld 103 (H) 70 - 99 mg/dL    Comment: Glucose reference range applies only to samples taken after fasting for at least 8 hours.  BUN 91 (H) 8 - 23 mg/dL   Creatinine, Ser 1.30 (H) 0.61 - 1.24 mg/dL   Calcium 7.7 (L) 8.9 - 10.3 mg/dL   Total Protein 6.7 6.5 - 8.1 g/dL   Albumin 2.9 (L) 3.5 - 5.0 g/dL   AST 865 (H) 15 - 41 U/L   ALT 59 (H) 0 - 44 U/L   Alkaline Phosphatase 133 (H) 38 - 126 U/L   Total Bilirubin 0.8 0.3 - 1.2 mg/dL   GFR, Estimated 7 (L) >60 mL/min    Comment: (NOTE) Calculated using the CKD-EPI Creatinine Equation (2021)    Anion gap 28 (H) 5 - 15    Comment: ELECTROLYTES REPEATED TO VERIFY Performed at [facility], [address], [place], [place] [address]   CBC     Status: Abnormal   Collection Time: 10/25/22  3:27 PM  Result Value Ref Range   WBC 6.7 4.0 -  10.5 K/uL   RBC 3.90 (L) 4.22 - 5.81 MIL/uL   Hemoglobin 10.4 (L) 13.0 - 17.0 g/dL   HCT 62.9 (L) 52.8 - 41.3 %   MCV 90.0 80.0 - 100.0 fL   MCH 26.7 26.0 - 34.0 pg   MCHC 29.6 (L) 30.0 - 36.0 g/dL   RDW 24.4 01.0 - 27.2 %   Platelets 74 (L) 150 - 400 K/uL    Comment: SPECIMEN CHECKED FOR CLOTS Immature Platelet Fraction may be clinically indicated, consider ordering this additional test [id number] REPEATED TO VERIFY    nRBC 0.4 (H) 0.0 - 0.2 %    Comment: Performed at [facility], [address] [place], [place] [address]  Troponin I (High Sensitivity)     Status: Abnormal   Collection Time: 10/25/22  3:27 PM  Result Value Ref Range   Troponin I (High Sensitivity) 2,879 (HH) <18 ng/L    Comment: CRITICAL RESULT CALLED TO, READ BACK BY AND VERIFIED WITH J. KANTLEHNER, RN AT [id number] 07.23.24 D. BLU (NOTE) Elevated high sensitivity troponin I (hsTnI) values and significant  changes across serial measurements may suggest ACS but many other  chronic and acute conditions are known to elevate hsTnI results.  Refer to the "Links" section for chest pain algorithms and additional  guidance. Performed at [facility], [address] [place], [place] [address]   Brain natriuretic peptide     Status: Abnormal   Collection Time: 10/25/22  3:28 PM  Result Value Ref Range   B Natriuretic Peptide 719.5 (H) 0.0 - 100.0 pg/mL    Comment: Performed at [facility], [address] [place], [place] [address]  I-Stat venous blood gas, [facility] ED, [facility], [facility])     Status: Abnormal   Collection Time: 10/25/22  3:39 PM  Result Value Ref Range   pH, Ven 7.325 7.25 - 7.43   pCO2, Ven 23.3 (L) 44 - 60 mmHg   pO2, Ven 104 (H) 32 - 45 mmHg   Bicarbonate 12.2 (L) 20.0 - 28.0 mmol/L   TCO2 13 (L) 22 - 32 mmol/L   O2 Saturation 98 %   Acid-base deficit 12.0 (H) 0.0 - 2.0 mmol/L   Sodium 137 135 - 145 mmol/L   Potassium 4.1 3.5 - 5.1 mmol/L   Calcium, Ion 0.80 (LL) 1.15 - 1.40 mmol/L   HCT  31.0 (L) 39.0 - 52.0 %   Hemoglobin 10.5 (L) 13.0 - 17.0 g/dL   Sample type VENOUS    Comment NOTIFIED PHYSICIAN   Resp panel  by RT-PCR (RSV, Flu A&B, Covid) Anterior Nasal Swab     Status: None   Collection Time: 10/25/22  3:41 PM   Specimen: Anterior Nasal Swab  Result Value Ref Range   SARS Coronavirus 2 by RT PCR NEGATIVE NEGATIVE   Influenza A by PCR NEGATIVE NEGATIVE   Influenza B by PCR NEGATIVE NEGATIVE    Comment: (NOTE) The Xpert Xpress SARS-CoV-2/FLU/RSV plus assay is intended as an aid in the diagnosis of influenza from Nasopharyngeal swab specimens and should not be used as a sole basis for treatment. Nasal washings and aspirates are unacceptable for Xpert Xpress SARS-CoV-2/FLU/RSV testing.  Fact Sheet for Patients: BloggerCourse.com  Fact Sheet for Healthcare Providers: SeriousBroker.it  This test is not yet approved or cleared by the [place] FDA and has been authorized for detection and/or diagnosis of SARS-CoV-2 by FDA under an Emergency Use Authorization (EUA). This EUA will remain in effect (meaning this test can be used) for the duration of the COVID-19 declaration under Section 564(b)(1) of the Act, 21 U.S.C. section 360bbb-3(b)(1), unless the authorization is terminated or revoked.     Resp Syncytial Virus by PCR NEGATIVE NEGATIVE    Comment: (NOTE) Fact Sheet for Patients: BloggerCourse.com  Fact Sheet for Healthcare Providers: SeriousBroker.it  This test is not yet approved or cleared by the [place] FDA and has been authorized for detection and/or diagnosis of SARS-CoV-2 by FDA under an Emergency Use Authorization (EUA). This EUA will remain in effect (meaning this test can be used) for the duration of the COVID-19 declaration under Section 564(b)(1) of the Act, 21 U.S.C. section 360bbb-3(b)(1), unless the authorization is terminated  or revoked.  Performed at [facility], [address] [place], [place] [address]   I-stat chem 8, ED (not at [facility], [facility] or [facility])     Status: Abnormal   Collection Time: 10/25/22  3:51 PM  Result Value Ref Range   Sodium 138 135 - 145 mmol/L   Potassium 4.0 3.5 - 5.1 mmol/L   Chloride 109 98 - 111 mmol/L   BUN 83 (H) 8 - 23 mg/dL   Creatinine, Ser 4.40 (H) 0.61 - 1.24 mg/dL   Glucose, Bld 98 70 - 99 mg/dL    Comment: Glucose reference range applies only to samples taken after fasting for at least 8 hours.   Calcium, Ion 0.82 (LL) 1.15 - 1.40 mmol/L   TCO2 13 (L) 22 - 32 mmol/L   Hemoglobin 15.6 13.0 - 17.0 g/dL   HCT 10.2 72.5 - 36.6 %   Comment NOTIFIED PHYSICIAN   Lactic acid, plasma     Status: Abnormal   Collection Time: 10/25/22  4:14 PM  Result Value Ref Range   Lactic Acid, Venous 6.8 (HH) 0.5 - 1.9 mmol/L    Comment: CRITICAL RESULT CALLED TO, READ BACK BY AND VERIFIED WITH J. Guillermina City, RN AT 1650 07.23.24 D. Leonidas Romberg Performed at [facility], [address] [place], [place] [address]   Lactic acid, plasma     Status: Abnormal   Collection Time: 10/25/22  4:58 PM  Result Value Ref Range   Lactic Acid, Venous 5.7 (HH) 0.5 - 1.9 mmol/L    Comment: CRITICAL VALUE NOTED. VALUE IS CONSISTENT WITH PREVIOUSLY REPORTED/CALLED VALUE Performed at [facility], [address] [place], [place] [address]   Troponin I (High Sensitivity)     Status: Abnormal   Collection Time: 10/25/22  4:58 PM  Result Value Ref Range  Troponin I (High Sensitivity) 2,461 (HH) <18 ng/L    Comment: CRITICAL VALUE NOTED. VALUE IS CONSISTENT WITH PREVIOUSLY REPORTED/CALLED VALUE (NOTE) Elevated high sensitivity troponin I (hsTnI) values and significant  changes across serial measurements may suggest ACS but many other  chronic and acute conditions are known to elevate hsTnI results.  Refer to the "Links" section for chest pain algorithms and additional  guidance. Performed at  [facility], [address] [place], [place] [address]     DG Chest Port 1 View  Result Date: 10/25/2022 CLINICAL DATA:  Shortness of breath. EXAM: PORTABLE CHEST 1 VIEW COMPARISON:  11/14/2019. FINDINGS: Enlarged cardiac silhouette. Low lung volume study with possible retrocardiac opacities. No visible pleural effusions or pneumothorax. No acute bony abnormality. Left shoulder osteoarthritis. IMPRESSION: 1. Low lung volume study with possible retrocardiac opacities, most likely atelectasis. If there is concern for infection, dedicated PA and lateral radiographs could better assess. 2. Cardiomegaly. Electronically Signed   By: Feliberto Harts M.D.   On: 10/25/2022 16:36    Assessment/Plan  AKI on CKD IV:  likely hemodynamically mediated in the setting of shock.  Acidotic, not hyperkalemic.  No metformin on board.  - supportive care- IVFs, pressors, antibiotics  - renal US ordered and pending  - on bicarb gtt  - no acute indications for RRT yet but if does not improve with treatment may need to pursue  2.  Shock:  - possibly septic- no obvious source  - empiric abx and pressors  - TTE pending  - on levophed  3.  Elevated troponins/ NSTEMI  - TTE pending- should tell us if there is R heart strain suggesting PE too  - cardiology following  - on hep gtt  4.  Acute hypoxic RF:  - on supplemental O2  5.  Elevated LFTs  - most likely d/t tissue ischemia  6.  Thrombocytopenia:  - likely in setting of possible septic shock  - consider sending smear for technologist review to r/o schistocytes  7.  Dispo: to ICU   The patient is critically ill with septic shock, NSTEMI, elevated troponins, and AKI and which includes my role to primarily manage electrolyte disturbances, acidemia, and renal failure.  This requires high complexity decision making.  Total critical care time: 34 min    Critical care time was exclusive of treating other patients.   Critical care was necessary  to treat or prevent imminent or life-threatening deterioration.   Critical care was time spent personally by me on the following activities:   development of treatment plan with patient and/or surrogate as well as nursing,   discussions with other provider evaluation of patient's response to treatment  examination of patient  obtaining history from patient or surrogate  ordering and performing treatments and interventions  ordering and review of laboratory studies  ordering and review of radiographic studies   Bufford Buttner 10/25/2022, 6:59 PM

## 2022-10-25 NOTE — ED Notes (Signed)
Lactic 6.8 md notified.

## 2022-10-25 NOTE — ED Notes (Signed)
ED TO INPATIENT HANDOFF REPORT  ED Nurse Name and Phone #:  Johnna Acosta [phone]  S Name/Age/Gender Troy Johnston 72 y.o. male Room/Bed: 027C/027C  Code Status   Code Status: Full Code  Home/SNF/Other Home Patient oriented to: self, place, time, and situation Is this baseline? Yes   Triage Complete: Triage complete  Chief Complaint AKI (acute kidney injury) (HCC) [N17.9]  Triage Note No notes on file   Allergies Allergies  Allergen Reactions   Penicillins Hives and Other (See Comments)    Has patient had a PCN reaction causing immediate rash, facial/tongue/throat swelling, SOB or lightheadedness with hypotension: no Has patient had a PCN reaction causing severe rash involving mucus membranes or skin necrosis: no Has patient had a PCN reaction that required hospitalization: yes Has patient had a PCN reaction occurring within the last 10 years: no If all of the above answers are "NO", then may proceed with Cephalosporin use.    Level of Care/Admitting Diagnosis ED Disposition     ED Disposition  Admit   Condition  --   Comment  Hospital Area: [facility] [[id number]]  Level of Care: ICU [6]  May admit patient to [facility] or [facility] if equivalent level of care is available:: No  Covid Evaluation: Confirmed COVID Negative  Diagnosis: AKI (acute kidney injury) [facility]) [[id number]]  Admitting Physician: Oretha Milch [[id number]]  Attending Physician: Oretha Milch [[id number]]  Certification:: I certify this patient will need inpatient services for at least 2 midnights  Estimated Length of Stay: 5          B Medical/Surgery History Past Medical History:  Diagnosis Date   Diabetes mellitus without complication (HCC)    GERD (gastroesophageal reflux disease)    Hypertension    Prostate cancer (HCC)    No past surgical history on file.   A IV Location/Drains/Wounds Patient Lines/Drains/Airways Status     Active Line/Drains/Airways     Name  Placement date Placement time Site Days   Peripheral IV 10/25/22 20 G Anterior;Proximal;Right Forearm 10/25/22  1545  Forearm  less than 1   Peripheral IV 10/25/22 18 G Left Antecubital 10/25/22  1545  Antecubital  less than 1   Peripheral IV 10/25/22 20 G 1.88" Left;Anterior Forearm 10/25/22  1838  Forearm  less than 1            Intake/Output Last 24 hours  Intake/Output Summary (Last 24 hours) at 10/25/2022 2002 Last data filed at 10/25/2022 1924 Gross per 24 hour  Intake 100 ml  Output --  Net 100 ml    Labs/Imaging Results for orders placed or performed during the hospital encounter of 10/25/22 (from the past 48 hour(s))  Comprehensive metabolic panel     Status: Abnormal   Collection Time: 10/25/22  3:27 PM  Result Value Ref Range   Sodium 140 135 - 145 mmol/L   Potassium 4.1 3.5 - 5.1 mmol/L   Chloride 102 98 - 111 mmol/L   CO2 10 (L) 22 - 32 mmol/L   Glucose, Bld 103 (H) 70 - 99 mg/dL    Comment: Glucose reference range applies only to samples taken after fasting for at least 8 hours.   BUN 91 (H) 8 - 23 mg/dL   Creatinine, Ser 5.62 (H) 0.61 - 1.24 mg/dL   Calcium 7.7 (L) 8.9 - 10.3 mg/dL   Total Protein 6.7 6.5 - 8.1 g/dL   Albumin 2.9 (L) 3.5 - 5.0 g/dL   AST 130 (H)  15 - 41 U/L   ALT 59 (H) 0 - 44 U/L   Alkaline Phosphatase 133 (H) 38 - 126 U/L   Total Bilirubin 0.8 0.3 - 1.2 mg/dL   GFR, Estimated 7 (L) >60 mL/min    Comment: (NOTE) Calculated using the CKD-EPI Creatinine Equation (2021)    Anion gap 28 (H) 5 - 15    Comment: ELECTROLYTES REPEATED TO VERIFY Performed at [facility], [address], [place], [place] [address]   CBC     Status: Abnormal   Collection Time: 10/25/22  3:27 PM  Result Value Ref Range   WBC 6.7 4.0 - 10.5 K/uL   RBC 3.90 (L) 4.22 - 5.81 MIL/uL   Hemoglobin 10.4 (L) 13.0 - 17.0 g/dL   HCT 29.5 (L) 62.1 - 30.8 %   MCV 90.0 80.0 - 100.0 fL   MCH 26.7 26.0 - 34.0 pg   MCHC 29.6 (L) 30.0 - 36.0 g/dL   RDW 65.7 84.6 -  96.2 %   Platelets 74 (L) 150 - 400 K/uL    Comment: SPECIMEN CHECKED FOR CLOTS Immature Platelet Fraction may be clinically indicated, consider ordering this additional test [id number] REPEATED TO VERIFY    nRBC 0.4 (H) 0.0 - 0.2 %    Comment: Performed at [facility], [address] [place], [place] [address]  Troponin I (High Sensitivity)     Status: Abnormal   Collection Time: 10/25/22  3:27 PM  Result Value Ref Range   Troponin I (High Sensitivity) 2,879 (HH) <18 ng/L    Comment: CRITICAL RESULT CALLED TO, READ BACK BY AND VERIFIED WITH J. KANTLEHNER, RN AT [phone] 07.23.24 D. BLU (NOTE) Elevated high sensitivity troponin I (hsTnI) values and significant  changes across serial measurements may suggest ACS but many other  chronic and acute conditions are known to elevate hsTnI results.  Refer to the "Links" section for chest pain algorithms and additional  guidance. Performed at [facility], [address] [place], [place] [address]   Brain natriuretic peptide     Status: Abnormal   Collection Time: 10/25/22  3:28 PM  Result Value Ref Range   B Natriuretic Peptide 719.5 (H) 0.0 - 100.0 pg/mL    Comment: Performed at [facility], [address], [place], [place] [address]  I-Stat venous blood gas, [facility] ED, [facility], [facility])     Status: Abnormal   Collection Time: 10/25/22  3:39 PM  Result Value Ref Range   pH, Ven 7.325 7.25 - 7.43   pCO2, Ven 23.3 (L) 44 - 60 mmHg   pO2, Ven 104 (H) 32 - 45 mmHg   Bicarbonate 12.2 (L) 20.0 - 28.0 mmol/L   TCO2 13 (L) 22 - 32 mmol/L   O2 Saturation 98 %   Acid-base deficit 12.0 (H) 0.0 - 2.0 mmol/L   Sodium 137 135 - 145 mmol/L   Potassium 4.1 3.5 - 5.1 mmol/L   Calcium, Ion 0.80 (LL) 1.15 - 1.40 mmol/L   HCT 31.0 (L) 39.0 - 52.0 %   Hemoglobin 10.5 (L) 13.0 - 17.0 g/dL   Sample type VENOUS    Comment NOTIFIED PHYSICIAN   Resp panel by RT-PCR (RSV, Flu A&B, Covid) Anterior Nasal Swab     Status: None   Collection Time:  10/25/22  3:41 PM   Specimen: Anterior Nasal Swab  Result Value Ref Range   SARS Coronavirus 2 by RT PCR NEGATIVE NEGATIVE   Influenza A by  PCR NEGATIVE NEGATIVE   Influenza B by PCR NEGATIVE NEGATIVE    Comment: (NOTE) The Xpert Xpress SARS-CoV-2/FLU/RSV plus assay is intended as an aid in the diagnosis of influenza from Nasopharyngeal swab specimens and should not be used as a sole basis for treatment. Nasal washings and aspirates are unacceptable for Xpert Xpress SARS-CoV-2/FLU/RSV testing.  Fact Sheet for Patients: BloggerCourse.com  Fact Sheet for Healthcare Providers: SeriousBroker.it  This test is not yet approved or cleared by the [place] FDA and has been authorized for detection and/or diagnosis of SARS-CoV-2 by FDA under an Emergency Use Authorization (EUA). This EUA will remain in effect (meaning this test can be used) for the duration of the COVID-19 declaration under Section 564(b)(1) of the Act, 21 U.S.C. section 360bbb-3(b)(1), unless the authorization is terminated or revoked.     Resp Syncytial Virus by PCR NEGATIVE NEGATIVE    Comment: (NOTE) Fact Sheet for Patients: BloggerCourse.com  Fact Sheet for Healthcare Providers: SeriousBroker.it  This test is not yet approved or cleared by the [place] FDA and has been authorized for detection and/or diagnosis of SARS-CoV-2 by FDA under an Emergency Use Authorization (EUA). This EUA will remain in effect (meaning this test can be used) for the duration of the COVID-19 declaration under Section 564(b)(1) of the Act, 21 U.S.C. section 360bbb-3(b)(1), unless the authorization is terminated or revoked.  Performed at [facility], [address], [place], [place] [address]   I-stat chem 8, ED (not at [facility], [facility] or [facility])     Status: Abnormal   Collection Time: 10/25/22  3:51 PM  Result Value Ref Range    Sodium 138 135 - 145 mmol/L   Potassium 4.0 3.5 - 5.1 mmol/L   Chloride 109 98 - 111 mmol/L   BUN 83 (H) 8 - 23 mg/dL   Creatinine, Ser 6.21 (H) 0.61 - 1.24 mg/dL   Glucose, Bld 98 70 - 99 mg/dL    Comment: Glucose reference range applies only to samples taken after fasting for at least 8 hours.   Calcium, Ion 0.82 (LL) 1.15 - 1.40 mmol/L   TCO2 13 (L) 22 - 32 mmol/L   Hemoglobin 15.6 13.0 - 17.0 g/dL   HCT 30.8 65.7 - 84.6 %   Comment NOTIFIED PHYSICIAN   Lactic acid, plasma     Status: Abnormal   Collection Time: 10/25/22  4:14 PM  Result Value Ref Range   Lactic Acid, Venous 6.8 (HH) 0.5 - 1.9 mmol/L    Comment: CRITICAL RESULT CALLED TO, READ BACK BY AND VERIFIED WITH J. Guillermina City, RN AT 1650 07.23.24 D. Leonidas Romberg Performed at [facility], [address] [place], [place] [address]   Lactic acid, plasma     Status: Abnormal   Collection Time: 10/25/22  4:58 PM  Result Value Ref Range   Lactic Acid, Venous 5.7 (HH) 0.5 - 1.9 mmol/L    Comment: CRITICAL VALUE NOTED. VALUE IS CONSISTENT WITH PREVIOUSLY REPORTED/CALLED VALUE Performed at [facility], [address], [place], [place] [address]   Troponin I (High Sensitivity)     Status: Abnormal   Collection Time: 10/25/22  4:58 PM  Result Value Ref Range   Troponin I (High Sensitivity) 2,461 (HH) <18 ng/L    Comment: CRITICAL VALUE NOTED. VALUE IS CONSISTENT WITH PREVIOUSLY REPORTED/CALLED VALUE (NOTE) Elevated high sensitivity troponin I (hsTnI) values and significant  changes across serial measurements may suggest ACS but many other  chronic and acute conditions are  known to elevate hsTnI results.  Refer to the "Links" section for chest pain algorithms and additional  guidance. Performed at [facility], [address], [place], [place] [address]    DG Chest Port 1 View  Result Date: 10/25/2022 CLINICAL DATA:  Shortness of breath. EXAM: PORTABLE CHEST 1 VIEW COMPARISON:  11/14/2019. FINDINGS: Enlarged  cardiac silhouette. Low lung volume study with possible retrocardiac opacities. No visible pleural effusions or pneumothorax. No acute bony abnormality. Left shoulder osteoarthritis. IMPRESSION: 1. Low lung volume study with possible retrocardiac opacities, most likely atelectasis. If there is concern for infection, dedicated PA and lateral radiographs could better assess. 2. Cardiomegaly. Electronically Signed   By: Feliberto Harts M.D.   On: 10/25/2022 16:36    Pending Labs Unresulted Labs (From admission, onward)     Start     Ordered   10/26/22 0500  Heparin level (unfractionated)  Daily,   R      10/25/22 1720   10/26/22 0500  CBC  Tomorrow morning,   R        10/25/22 1802   10/26/22 0500  Magnesium  Tomorrow morning,   R        10/25/22 1802   10/26/22 0500  Phosphorus  Tomorrow morning,   R        10/25/22 1802   10/26/22 0100  Heparin level (unfractionated)  Once-Timed,   URGENT        10/25/22 1720   10/25/22 1900  Differential  Once,   AD        10/25/22 1900   10/25/22 1900  D-dimer, quantitative  Once,   AD        10/25/22 1900   10/25/22 1900  Basic metabolic panel  Once,   R        10/25/22 1802   10/25/22 1801  Procalcitonin  Add-on,   AD       References:    Procalcitonin Lower Respiratory Tract Infection AND Sepsis Procalcitonin Algorithm   10/25/22 1802   10/25/22 1608  Urinalysis, w/ Reflex to Culture (Infection Suspected) -Urine, Clean Catch  (Septic presentation on arrival (screening labs, nursing and treatment orders for obvious sepsis))  ONCE - URGENT,   URGENT       Question:  Specimen Source  Answer:  Urine, Clean Catch   10/25/22 1607   10/25/22 1607  Blood Culture (routine x 2)  (Septic presentation on arrival (screening labs, nursing and treatment orders for obvious sepsis))  BLOOD CULTURE X 2,   STAT      10/25/22 1607   10/25/22 1528  Blood gas, venous  Once,   R        10/25/22 1528            Vitals/Pain Today's Vitals   10/25/22 1925  10/25/22 1930 10/25/22 1945 10/25/22 2000  BP: (!) 88/50 (!) 83/49 (!) 96/58 (!) 100/57  Pulse: 94 94 92 84  Resp: (!) 38 (!) 38 (!) 43 (!) 41  Temp:      TempSrc:      SpO2: 97% 98% 97% 92%  Weight:      Height:      PainSc: 0-No pain       Isolation Precautions No active isolations  Medications Medications  sodium bicarbonate 150 mEq in dextrose 5 % 1,150 mL infusion ( Intravenous Rate/Dose Change 10/25/22 1945)  vancomycin (VANCOREADY) IVPB 2000 mg/400 mL (2,000 mg Intravenous New Bag/Given 10/25/22 1925)  heparin ADULT  infusion 100 units/mL (25000 units/269mL) (1,100 Units/hr Intravenous New Bag/Given 10/25/22 1818)  docusate sodium (COLACE) capsule 100 mg (has no administration in time range)  polyethylene glycol (MIRALAX / GLYCOLAX) packet 17 g (has no administration in time range)  acetaminophen (TYLENOL) tablet 650 mg (has no administration in time range)  0.9 %  sodium chloride infusion (250 mLs Intravenous New Bag/Given 10/25/22 1942)  norepinephrine (LEVOPHED) 4mg  in (0.016 mg/mL) premix infusion (7 mcg/min Intravenous Rate/Dose Change 10/25/22 1926)  lactated ringers bolus 1,000 mL (0 mLs Intravenous Stopped 10/25/22 1712)  aztreonam (AZACTAM) 2 g in sodium chloride 0.9 % 100 mL IVPB (0 g Intravenous Stopped 10/25/22 1924)  metroNIDAZOLE (FLAGYL) IVPB 500 mg (0 mg Intravenous Stopped 10/25/22 1728)  heparin bolus via infusion 4,000 Units (4,000 Units Intravenous Bolus from Bag 10/25/22 1819)  sodium bicarbonate injection 50 mEq (50 mEq Intravenous Given 10/25/22 1937)    Mobility walks     Focused Assessments Cardiac Assessment Handoff:    No results found for: "CKTOTAL", "CKMB", "CKMBINDEX", "TROPONINI" No results found for: "DDIMER" Does the Patient currently have chest pain? No    R Recommendations: See Admitting Provider Note  Report given to:   Additional Notes:

## 2022-10-25 NOTE — Progress Notes (Signed)
ED Pharmacy Antibiotic Sign Off An antibiotic consult was received from an ED provider for vancomycin and aztreonam per pharmacy dosing for sepsis. A chart review was completed to assess appropriateness.  A single dose of  flagyl  500 mg was placed by the ED provider.   The following one time order(s) were placed per pharmacy consult:  aztreonam 2000 mg x 1 dose vancomycin 2000 mg x 1 dose  Further antibiotic and/or antibiotic pharmacy consults should be ordered by the admitting provider if indicated.   Thank you for allowing pharmacy to be a part of this patient's care.   Delmar Landau, PharmD, BCPS 10/25/2022 4:32 PM ED Clinical Pharmacist -  [phone]

## 2022-10-25 NOTE — Progress Notes (Signed)
An USGPIV (ultrasound guided PIV) has been placed for short-term vasopressor infusion. A correctly placed ivWatch must be used when administering Vasopressors. Should this treatment be needed beyond 72 hours, central line access should be obtained.  It will be the responsibility of the bedside nurse to follow best practice to prevent extravasations.   

## 2022-10-25 NOTE — Sepsis Progress Note (Signed)
eLink is following this Code Sepsis. °

## 2022-10-26 ENCOUNTER — Inpatient Hospital Stay (HOSPITAL_COMMUNITY): Payer: No Typology Code available for payment source

## 2022-10-26 ENCOUNTER — Other Ambulatory Visit (HOSPITAL_COMMUNITY): Payer: No Typology Code available for payment source

## 2022-10-26 DIAGNOSIS — R6521 Severe sepsis with septic shock: Secondary | ICD-10-CM | POA: Diagnosis not present

## 2022-10-26 DIAGNOSIS — I469 Cardiac arrest, cause unspecified: Secondary | ICD-10-CM

## 2022-10-26 DIAGNOSIS — R008 Other abnormalities of heart beat: Secondary | ICD-10-CM | POA: Diagnosis not present

## 2022-10-26 DIAGNOSIS — N189 Chronic kidney disease, unspecified: Secondary | ICD-10-CM

## 2022-10-26 DIAGNOSIS — N179 Acute kidney failure, unspecified: Secondary | ICD-10-CM

## 2022-10-26 DIAGNOSIS — A419 Sepsis, unspecified organism: Secondary | ICD-10-CM

## 2022-10-26 DIAGNOSIS — I5021 Acute systolic (congestive) heart failure: Secondary | ICD-10-CM

## 2022-10-26 DIAGNOSIS — R579 Shock, unspecified: Secondary | ICD-10-CM

## 2022-10-26 DIAGNOSIS — I5082 Biventricular heart failure: Secondary | ICD-10-CM | POA: Diagnosis not present

## 2022-10-26 LAB — HEMOGLOBIN A1C
Hgb A1c MFr Bld: 7.5 % — ABNORMAL HIGH (ref 4.8–5.6)
Mean Plasma Glucose: 168.55 mg/dL

## 2022-10-26 LAB — BASIC METABOLIC PANEL
Anion gap: 19 — ABNORMAL HIGH (ref 5–15)
Anion gap: 20 — ABNORMAL HIGH (ref 5–15)
Anion gap: 24 — ABNORMAL HIGH (ref 5–15)
BUN: 98 mg/dL — ABNORMAL HIGH (ref 8–23)
BUN: 104 mg/dL — ABNORMAL HIGH (ref 8–23)
BUN: 111 mg/dL — ABNORMAL HIGH (ref 8–23)
CO2: 19 mmol/L — ABNORMAL LOW (ref 22–32)
CO2: 21 mmol/L — ABNORMAL LOW (ref 22–32)
CO2: 20 mmol/L — ABNORMAL LOW (ref 22–32)
Calcium: 6.8 mg/dL — ABNORMAL LOW (ref 8.9–10.3)
Calcium: 8.1 mg/dL — ABNORMAL LOW (ref 8.9–10.3)
Calcium: 7.9 mg/dL — ABNORMAL LOW (ref 8.9–10.3)
Chloride: 99 mmol/L (ref 98–111)
Chloride: 98 mmol/L (ref 98–111)
Chloride: 95 mmol/L — ABNORMAL LOW (ref 98–111)
Creatinine, Ser: 7.54 mg/dL — ABNORMAL HIGH (ref 0.61–1.24)
Creatinine, Ser: 7.89 mg/dL — ABNORMAL HIGH (ref 0.61–1.24)
Creatinine, Ser: 8.14 mg/dL — ABNORMAL HIGH (ref 0.61–1.24)
GFR, Estimated: 7 mL/min — ABNORMAL LOW (ref >60)
GFR, Estimated: 7 mL/min — ABNORMAL LOW (ref >60)
GFR, Estimated: 6 mL/min — ABNORMAL LOW (ref >60)
Glucose, Bld: 272 mg/dL — ABNORMAL HIGH (ref 70–99)
Glucose, Bld: 211 mg/dL — ABNORMAL HIGH (ref 70–99)
Glucose, Bld: 197 mg/dL — ABNORMAL HIGH (ref 70–99)
Potassium: 4.1 mmol/L (ref 3.5–5.1)
Potassium: 3.3 mmol/L — ABNORMAL LOW (ref 3.5–5.1)
Potassium: 4.3 mmol/L (ref 3.5–5.1)
Sodium: 137 mmol/L (ref 135–145)
Sodium: 139 mmol/L (ref 135–145)
Sodium: 139 mmol/L (ref 135–145)

## 2022-10-26 LAB — HEPARIN LEVEL (UNFRACTIONATED)
Heparin Unfractionated: 0.1 IU/mL — ABNORMAL LOW (ref 0.30–0.70)
Heparin Unfractionated: 0.12 IU/mL — ABNORMAL LOW (ref 0.30–0.70)
Heparin Unfractionated: 0.2 IU/mL — ABNORMAL LOW (ref 0.30–0.70)

## 2022-10-26 LAB — POCT I-STAT 7, (LYTES, BLD GAS, ICA,H+H)
Acid-base deficit: 5 mmol/L — ABNORMAL HIGH (ref 0.0–2.0)
Acid-base deficit: 4 mmol/L — ABNORMAL HIGH (ref 0.0–2.0)
Bicarbonate: 19.5 mmol/L — ABNORMAL LOW (ref 20.0–28.0)
Bicarbonate: 21.4 mmol/L (ref 20.0–28.0)
Calcium, Ion: 0.93 mmol/L — ABNORMAL LOW (ref 1.15–1.40)
Calcium, Ion: 1.05 mmol/L — ABNORMAL LOW (ref 1.15–1.40)
HCT: 26 % — ABNORMAL LOW (ref 39.0–52.0)
HCT: 26 % — ABNORMAL LOW (ref 39.0–52.0)
Hemoglobin: 8.8 g/dL — ABNORMAL LOW (ref 13.0–17.0)
Hemoglobin: 8.8 g/dL — ABNORMAL LOW (ref 13.0–17.0)
O2 Saturation: 100 %
O2 Saturation: 100 %
Patient temperature: 97.6
Patient temperature: 99.1
Potassium: 4.1 mmol/L (ref 3.5–5.1)
Potassium: 3.4 mmol/L — ABNORMAL LOW (ref 3.5–5.1)
Sodium: 136 mmol/L (ref 135–145)
Sodium: 138 mmol/L (ref 135–145)
TCO2: 21 mmol/L — ABNORMAL LOW (ref 22–32)
TCO2: 23 mmol/L (ref 22–32)
pCO2 arterial: 33.4 mmHg (ref 32–48)
pCO2 arterial: 38.6 mmHg (ref 32–48)
pH, Arterial: 7.373 (ref 7.35–7.45)
pH, Arterial: 7.353 (ref 7.35–7.45)
pO2, Arterial: 267 mmHg — ABNORMAL HIGH (ref 83–108)
pO2, Arterial: 188 mmHg — ABNORMAL HIGH (ref 83–108)

## 2022-10-26 LAB — GASTROINTESTINAL PANEL BY PCR, STOOL (REPLACES STOOL CULTURE)

## 2022-10-26 LAB — LACTIC ACID, PLASMA
Lactic Acid, Venous: 3 mmol/L (ref 0.5–1.9)
Lactic Acid, Venous: 4.1 mmol/L (ref 0.5–1.9)
Lactic Acid, Venous: 6.3 mmol/L (ref 0.5–1.9)
Lactic Acid, Venous: 5.8 mmol/L (ref 0.5–1.9)

## 2022-10-26 LAB — FERRITIN: Ferritin: 209 ng/mL (ref 24–336)

## 2022-10-26 LAB — BLOOD CULTURE ID PANEL (REFLEXED) - BCID2

## 2022-10-26 LAB — BLOOD GAS, VENOUS
Acid-base deficit: 4.2 mmol/L — ABNORMAL HIGH (ref 0.0–2.0)
Bicarbonate: 20.1 mmol/L (ref 20.0–28.0)
O2 Saturation: 99.3 %
Patient temperature: 37
pCO2, Ven: 34 mmHg — ABNORMAL LOW (ref 44–60)
pH, Ven: 7.38 (ref 7.25–7.43)
pO2, Ven: 215 mmHg — ABNORMAL HIGH (ref 32–45)

## 2022-10-26 LAB — CBC WITH DIFFERENTIAL/PLATELET
Abs Immature Granulocytes: 0.51 10*3/uL — ABNORMAL HIGH (ref 0.00–0.07)
Basophils Absolute: 0 10*3/uL (ref 0.0–0.1)
Basophils Relative: 0 %
Eosinophils Absolute: 0 10*3/uL (ref 0.0–0.5)
Eosinophils Relative: 0 %
HCT: 25.5 % — ABNORMAL LOW (ref 39.0–52.0)
Hemoglobin: 8.3 g/dL — ABNORMAL LOW (ref 13.0–17.0)
Immature Granulocytes: 6 %
Lymphocytes Relative: 5 %
Lymphs Abs: 0.4 10*3/uL — ABNORMAL LOW (ref 0.7–4.0)
MCH: 26.8 pg (ref 26.0–34.0)
MCHC: 32.5 g/dL (ref 30.0–36.0)
MCV: 82.3 fL (ref 80.0–100.0)
Monocytes Absolute: 0.4 10*3/uL (ref 0.1–1.0)
Monocytes Relative: 4 %
Neutro Abs: 8 10*3/uL — ABNORMAL HIGH (ref 1.7–7.7)
Neutrophils Relative %: 85 %
Platelets: 50 10*3/uL — ABNORMAL LOW (ref 150–400)
RBC: 3.1 MIL/uL — ABNORMAL LOW (ref 4.22–5.81)
RDW: 13.9 % (ref 11.5–15.5)
Smear Review: NORMAL
WBC: 9.3 10*3/uL (ref 4.0–10.5)
nRBC: 0.5 % — ABNORMAL HIGH (ref 0.0–0.2)

## 2022-10-26 LAB — GLUCOSE, CAPILLARY
Glucose-Capillary: 174 mg/dL — ABNORMAL HIGH (ref 70–99)
Glucose-Capillary: 176 mg/dL — ABNORMAL HIGH (ref 70–99)
Glucose-Capillary: 183 mg/dL — ABNORMAL HIGH (ref 70–99)
Glucose-Capillary: 183 mg/dL — ABNORMAL HIGH (ref 70–99)
Glucose-Capillary: 196 mg/dL — ABNORMAL HIGH (ref 70–99)
Glucose-Capillary: 245 mg/dL — ABNORMAL HIGH (ref 70–99)
Glucose-Capillary: 276 mg/dL — ABNORMAL HIGH (ref 70–99)

## 2022-10-26 LAB — PHOSPHORUS: Phosphorus: 6.9 mg/dL — ABNORMAL HIGH (ref 2.5–4.6)

## 2022-10-26 LAB — ECHOCARDIOGRAM COMPLETE
Area-P 1/2: 5.13 cm2
Height: 68 in
S' Lateral: 3.4 cm
Single Plane A4C EF: 20.4 %
Weight: 3566.16 oz

## 2022-10-26 LAB — CULTURE, BLOOD (ROUTINE X 2): Special Requests: ADEQUATE

## 2022-10-26 LAB — CBC
HCT: 26.3 % — ABNORMAL LOW (ref 39.0–52.0)
HCT: 25.9 % — ABNORMAL LOW (ref 39.0–52.0)
Hemoglobin: 8.5 g/dL — ABNORMAL LOW (ref 13.0–17.0)
Hemoglobin: 8.4 g/dL — ABNORMAL LOW (ref 13.0–17.0)
MCH: 26.5 pg (ref 26.0–34.0)
MCH: 26.3 pg (ref 26.0–34.0)
MCHC: 32.3 g/dL (ref 30.0–36.0)
MCHC: 32.4 g/dL (ref 30.0–36.0)
MCV: 81.9 fL (ref 80.0–100.0)
MCV: 81.2 fL (ref 80.0–100.0)
Platelets: 53 10*3/uL — ABNORMAL LOW (ref 150–400)
Platelets: 49 10*3/uL — ABNORMAL LOW (ref 150–400)
RBC: 3.21 MIL/uL — ABNORMAL LOW (ref 4.22–5.81)
RBC: 3.19 MIL/uL — ABNORMAL LOW (ref 4.22–5.81)
RDW: 14 % (ref 11.5–15.5)
RDW: 13.7 % (ref 11.5–15.5)
WBC: 11.4 10*3/uL — ABNORMAL HIGH (ref 4.0–10.5)
WBC: 11.2 10*3/uL — ABNORMAL HIGH (ref 4.0–10.5)
nRBC: 0.2 % (ref 0.0–0.2)
nRBC: 0.7 % — ABNORMAL HIGH (ref 0.0–0.2)

## 2022-10-26 LAB — IRON AND TIBC
Iron: 13 ug/dL — ABNORMAL LOW (ref 45–182)
Saturation Ratios: 7 % — ABNORMAL LOW (ref 17.9–39.5)
TIBC: 186 ug/dL — ABNORMAL LOW (ref 250–450)
UIBC: 173 ug/dL

## 2022-10-26 LAB — TROPONIN I (HIGH SENSITIVITY)
Troponin I (High Sensitivity): 1284 ng/L (ref <18)
Troponin I (High Sensitivity): 1375 ng/L (ref <18)

## 2022-10-26 LAB — MAGNESIUM: Magnesium: 1.8 mg/dL (ref 1.7–2.4)

## 2022-10-26 LAB — C DIFFICILE QUICK SCREEN W PCR REFLEX
C Diff antigen: NEGATIVE
C Diff interpretation: NOT DETECTED
C Diff toxin: NEGATIVE

## 2022-10-26 LAB — COOXEMETRY PANEL
Carboxyhemoglobin: 2.6 % — ABNORMAL HIGH (ref 0.5–1.5)
Methemoglobin: <0.7 % (ref 0.0–1.5)
O2 Saturation: 75 %
Total hemoglobin: <6.9 g/dL — CL (ref 12.0–16.0)

## 2022-10-26 MED ORDER — ORAL CARE MOUTH RINSE: 15.0000 mL | OROMUCOSAL | Status: DC | PRN

## 2022-10-26 MED ORDER — FENTANYL CITRATE PF 50 MCG/ML IJ SOSY
PREFILLED_SYRINGE | INTRAMUSCULAR | Status: AC
  Filled 2022-10-26: qty 2

## 2022-10-26 MED ORDER — VASOPRESSIN 20 UNITS/100 ML INFUSION FOR SHOCK
0.0400 [IU]/min | INTRAVENOUS | Status: DC
  Administered 2022-10-26 – 2022-10-28 (×7): 0.04 [IU]/min via INTRAVENOUS
  Filled 2022-10-26 (×7): qty 100

## 2022-10-26 MED ORDER — METRONIDAZOLE 500 MG/100ML IV SOLN
500.0000 mg | Freq: Three times a day (TID) | INTRAVENOUS | Status: DC
  Administered 2022-10-26: 500 mg via INTRAVENOUS
  Filled 2022-10-26: qty 100

## 2022-10-26 MED ORDER — HEPARIN SODIUM (PORCINE) 1000 UNIT/ML IJ SOLN
INTRAMUSCULAR | Status: AC
  Administered 2022-10-26: 2400 [IU]
  Filled 2022-10-26: qty 1

## 2022-10-26 MED ORDER — AMIODARONE HCL IN DEXTROSE 360-4.14 MG/200ML-% IV SOLN
60.0000 mg/h | INTRAVENOUS | Status: AC
  Administered 2022-10-26: 60 mg/h via INTRAVENOUS

## 2022-10-26 MED ORDER — NOREPINEPHRINE 16 MG/250ML-% IV SOLN
0.0000 ug/min | INTRAVENOUS | Status: DC
  Administered 2022-10-26: 39 ug/min via INTRAVENOUS
  Administered 2022-10-26 (×2): 40 ug/min via INTRAVENOUS
  Administered 2022-10-27: 16 ug/min via INTRAVENOUS
  Filled 2022-10-26 (×3): qty 250

## 2022-10-26 MED ORDER — SODIUM CHLORIDE 0.9 % IV SOLN
4.0000 g | Freq: Once | INTRAVENOUS | Status: AC
  Administered 2022-10-26: 4 g via INTRAVENOUS
  Filled 2022-10-26: qty 40

## 2022-10-26 MED ORDER — EPINEPHRINE HCL 5 MG/250ML IV SOLN IN NS
0.5000 ug/min | INTRAVENOUS | Status: DC
  Administered 2022-10-26: 5 ug/min via INTRAVENOUS
  Administered 2022-10-26: 28 ug/min via INTRAVENOUS
  Filled 2022-10-26: qty 250

## 2022-10-26 MED ORDER — MIDAZOLAM HCL 2 MG/2ML IJ SOLN
1.0000 mg | INTRAMUSCULAR | Status: DC | PRN
  Administered 2022-10-26: 1 mg via INTRAVENOUS
  Filled 2022-10-26: qty 2

## 2022-10-26 MED ORDER — SODIUM BICARBONATE 8.4 % IV SOLN
50.0000 meq | Freq: Once | INTRAVENOUS | Status: AC
  Administered 2022-10-26: 50 meq via INTRAVENOUS
  Filled 2022-10-26: qty 50

## 2022-10-26 MED ORDER — FENTANYL CITRATE PF 50 MCG/ML IJ SOSY
25.0000 ug | PREFILLED_SYRINGE | INTRAMUSCULAR | Status: DC | PRN
  Administered 2022-10-26: 50 ug via INTRAVENOUS
  Administered 2022-10-26: 100 ug via INTRAVENOUS
  Administered 2022-10-26: 50 ug via INTRAVENOUS
  Administered 2022-10-27 (×2): 100 ug via INTRAVENOUS
  Filled 2022-10-26: qty 1
  Filled 2022-10-26 (×3): qty 2

## 2022-10-26 MED ORDER — POLYETHYLENE GLYCOL 3350 17 G PO PACK: 17.0000 g | PACK | Freq: Every day | ORAL | Status: DC

## 2022-10-26 MED ORDER — INSULIN ASPART 100 UNIT/ML IJ SOLN
0.0000 [IU] | INTRAMUSCULAR | Status: DC
  Administered 2022-10-26: 3 [IU] via SUBCUTANEOUS
  Administered 2022-10-26: 8 [IU] via SUBCUTANEOUS
  Administered 2022-10-26: 5 [IU] via SUBCUTANEOUS

## 2022-10-26 MED ORDER — STERILE WATER FOR INJECTION IV SOLN
INTRAVENOUS | Status: DC
  Filled 2022-10-26: qty 1000

## 2022-10-26 MED ORDER — NOREPINEPHRINE 4 MG/250ML-% IV SOLN
0.0000 ug/min | INTRAVENOUS | Status: DC
  Filled 2022-10-26: qty 500

## 2022-10-26 MED ORDER — INSULIN ASPART 100 UNIT/ML IJ SOLN
0.0000 [IU] | INTRAMUSCULAR | Status: DC
  Administered 2022-10-26 – 2022-10-27 (×8): 4 [IU] via SUBCUTANEOUS
  Administered 2022-10-27: 7 [IU] via SUBCUTANEOUS
  Administered 2022-10-28: 4 [IU] via SUBCUTANEOUS
  Administered 2022-10-28 (×2): 3 [IU] via SUBCUTANEOUS
  Administered 2022-10-28: 4 [IU] via SUBCUTANEOUS
  Administered 2022-10-28: 7 [IU] via SUBCUTANEOUS
  Administered 2022-10-28: 4 [IU] via SUBCUTANEOUS
  Administered 2022-10-28: 3 [IU] via SUBCUTANEOUS
  Administered 2022-10-29: 7 [IU] via SUBCUTANEOUS
  Administered 2022-10-29: 4 [IU] via SUBCUTANEOUS
  Administered 2022-10-29 (×2): 3 [IU] via SUBCUTANEOUS
  Administered 2022-10-29: 7 [IU] via SUBCUTANEOUS
  Administered 2022-10-30: 3 [IU] via SUBCUTANEOUS

## 2022-10-26 MED ORDER — ATORVASTATIN CALCIUM 10 MG PO TABS: 20.0000 mg | ORAL_TABLET | Freq: Every day | ORAL | Status: DC

## 2022-10-26 MED ORDER — FAMOTIDINE 20 MG PO TABS: 20.0000 mg | ORAL_TABLET | Freq: Two times a day (BID) | ORAL | Status: DC

## 2022-10-26 MED ORDER — SODIUM CHLORIDE 0.9 % IV SOLN: INTRAVENOUS | Status: DC | PRN

## 2022-10-26 MED ORDER — LACTATED RINGERS IV BOLUS
1000.0000 mL | Freq: Once | INTRAVENOUS | Status: AC
  Administered 2022-10-26: 1000 mL via INTRAVENOUS

## 2022-10-26 MED ORDER — VANCOMYCIN VARIABLE DOSE PER UNSTABLE RENAL FUNCTION (PHARMACIST DOSING): Status: DC

## 2022-10-26 MED ORDER — VASOPRESSIN 20 UNITS/100 ML INFUSION FOR SHOCK
INTRAVENOUS | Status: AC
  Administered 2022-10-26: 0.03 [IU]/min via INTRAVENOUS
  Filled 2022-10-26: qty 100

## 2022-10-26 MED ORDER — SODIUM CHLORIDE 0.9 % IV SOLN
2.0000 g | Freq: Two times a day (BID) | INTRAVENOUS | Status: DC
  Administered 2022-10-26: 2 g via INTRAVENOUS
  Filled 2022-10-26: qty 10

## 2022-10-26 MED ORDER — ORAL CARE MOUTH RINSE
15.0000 mL | OROMUCOSAL | Status: DC
  Administered 2022-10-26 – 2022-10-27 (×16): 15 mL via OROMUCOSAL

## 2022-10-26 MED ORDER — DOCUSATE SODIUM 50 MG/5ML PO LIQD: 100.0000 mg | Freq: Two times a day (BID) | ORAL | Status: DC

## 2022-10-26 MED ORDER — MAGNESIUM SULFATE 2 GM/50ML IV SOLN
2.0000 g | Freq: Once | INTRAVENOUS | Status: AC
  Administered 2022-10-26: 2 g via INTRAVENOUS
  Filled 2022-10-26: qty 50

## 2022-10-26 MED ORDER — FENTANYL CITRATE PF 50 MCG/ML IJ SOSY: 25.0000 ug | PREFILLED_SYRINGE | INTRAMUSCULAR | Status: DC | PRN

## 2022-10-26 MED ORDER — PANTOPRAZOLE SODIUM 40 MG IV SOLR
40.0000 mg | INTRAVENOUS | Status: DC
  Administered 2022-10-27 – 2022-10-28 (×2): 40 mg via INTRAVENOUS
  Filled 2022-10-26 (×2): qty 10

## 2022-10-26 MED ORDER — SODIUM CHLORIDE 0.9 % IV SOLN
1.0000 g | Freq: Every day | INTRAVENOUS | Status: DC
  Administered 2022-10-26 – 2022-10-27 (×2): 1 g via INTRAVENOUS
  Filled 2022-10-26 (×2): qty 20

## 2022-10-26 MED ORDER — INSULIN GLARGINE-YFGN 100 UNIT/ML ~~LOC~~ SOLN
5.0000 [IU] | Freq: Every day | SUBCUTANEOUS | Status: DC
  Administered 2022-10-26 – 2022-10-28 (×3): 5 [IU] via SUBCUTANEOUS
  Filled 2022-10-26 (×4): qty 0.05

## 2022-10-26 MED ORDER — VANCOMYCIN HCL 1500 MG/300ML IV SOLN: 1500.0000 mg | INTRAVENOUS | Status: DC

## 2022-10-26 MED ORDER — HEPARIN (PORCINE) 25000 UT/250ML-% IV SOLN
1550.0000 [IU]/h | INTRAVENOUS | Status: DC
  Administered 2022-10-26: 1350 [IU]/h via INTRAVENOUS
  Administered 2022-10-27 – 2022-10-31 (×7): 1550 [IU]/h via INTRAVENOUS
  Filled 2022-10-26 (×8): qty 250

## 2022-10-26 MED ORDER — AMIODARONE HCL IN DEXTROSE 360-4.14 MG/200ML-% IV SOLN: 30.0000 mg/h | INTRAVENOUS | Status: DC

## 2022-10-26 MED ORDER — HYDROCORTISONE SOD SUC (PF) 100 MG IJ SOLR
100.0000 mg | Freq: Two times a day (BID) | INTRAMUSCULAR | Status: DC
  Administered 2022-10-26 – 2022-10-27 (×4): 100 mg via INTRAVENOUS
  Filled 2022-10-26 (×4): qty 2

## 2022-10-26 NOTE — Consult Note (Addendum)
Advanced Heart Failure Team Consult Note   Primary Physician: Patient, No Pcp Per PCP-Cardiologist:  None  Reason for Consultation: Shock, new systolic CHF  HPI:    Troy Johnston is seen today for evaluation of shock and new systolic CHF at the request of Dr. [name] with PCCM. 72 y.o. male with history of CKD (baseline Scr 2.4), HTN, DM II, prostate cancer in remission, peptic ulcer disease w/ history of GI bleed. Follows with [facility] for most of his care.  Presented via EMS on 10/25/22 with 3 days of worsening dyspnea. Per his cousin had been feeling poorly the week prior. He thought he had a viral illness. Labs significant for creatinine 8.0, bicarb 10, LFTs elevated, lactic acid 6.8, hgb 10.4, platelets 74K, HS troponin [id number]. Initially ECG with sinus tachycardia. He iniitally was placed on NRB but improved and weaned to [facility]. He was hypotensive and started on NE. He was admitted to ICU for management of septic vs cardiogenic shock, AKI on CKD and severe metabolic acidosis. Started on empiric antibiotics. He was seen by Cardiology. Troponin elevation thought to be likely d/t demand ischemia. No cath d/t AKI. He was started on heparin gtt.   Overnight went into atrial fibrillation with RVR and started on amiodarone gtt. Also had increasing pressor requirements and worsening acidosis.   Around 9 am became bradycardiac and suffered PEA arrest. ROSC achieved after 10 minutes. Subsequently developed Vfib and was defibrillated >> Afib with RVR. He was emergently intubated. Escalated to three pressors to maintain MAP. Now on meropenem for enterobacter cloacae bacteremia.   Echo today EF 20-25%, RV severely reduced, no significant valvular disease  Advanced Heart Failure asked to see to assist with management of shock and new systolic CHF   Home Medications Prior to Admission medications   Medication Sig Start Date End Date Taking? Authorizing Provider  allopurinol (ZYLOPRIM)  100 MG tablet Take 50 mg by mouth every other day. For high uric acid level   Yes [provider]  Alogliptin Benzoate 12.5 MG TABS Take 6.25 mg by mouth every morning. For diabetes (replaces metformin)   Yes [provider]  atorvastatin (LIPITOR) 20 MG tablet Take 20 mg by mouth at bedtime. For cholesterol   Yes [provider]  carvedilol (COREG) 25 MG tablet Take 12.5 mg by mouth 2 (two) times daily with a meal. 07/08/22  Yes [provider]  glipiZIDE (GLUCOTROL) 10 MG tablet Take 5 mg by mouth 2 (two) times daily before a meal. For diabetes   Yes [provider]  hydrALAZINE (APRESOLINE) 100 MG tablet Take 100 mg by mouth every 8 (eight) hours.   Yes [provider]  losartan (COZAAR) 100 MG tablet Take 100 mg by mouth daily.   Yes [provider]    Past Medical History: Past Medical History:  Diagnosis Date   Diabetes mellitus without complication (HCC)    GERD (gastroesophageal reflux disease)    Hypertension    Prostate cancer [facility])     Past Surgical History: No past surgical history on file.  Family History: No family history on file.  Social History: Social History   Socioeconomic History   Marital status: Single    Spouse name: Not on file   Number of children: Not on file   Years of education: Not on file   Highest education level: Not on file  Occupational History   Not on file  Tobacco Use   Smoking status: Never  Smokeless tobacco: Not on file  Substance and Sexual Activity   Alcohol use: No   Drug use: No   Sexual activity: Not on file  Other Topics Concern   Not on file  Social History Narrative   Not on file   Social Determinants of Health   Financial Resource Strain: Not on file  Food Insecurity: Not on file  Transportation Needs: Not on file  Physical Activity: Not on file  Stress: Not on file  Social Connections: Not on file    Allergies:  Allergies  Allergen Reactions    Ferrous Sulfate Other (See Comments)   Penicillins Hives and Other (See Comments)    Has patient had a PCN reaction causing immediate rash, facial/tongue/throat swelling, SOB or lightheadedness with hypotension: no Has patient had a PCN reaction causing severe rash involving mucus membranes or skin necrosis: no Has patient had a PCN reaction that required hospitalization: yes Has patient had a PCN reaction occurring within the last 10 years: no If all of the above answers are "NO", then may proceed with Cephalosporin use.    Objective:    Vital Signs:   Temp:  [96.8 F (36 C)-98.9 F (37.2 C)] 97.8 F (36.6 C) (07/24 1103) Pulse Rate:  [71-185] 100 (07/24 0700) Resp:  [20-53] 41 (07/24 0700) BP: (62-155)/(20-122) 110/79 (07/24 0400) SpO2:  [89 %-100 %] 99 % (07/24 0700) Arterial Line BP: (84-197)/(36-58) 101/42 (07/24 0700) FiO2 (%):  [40 %-100 %] 70 % (07/24 1127) Weight:  [95 kg-101.1 kg] 101.1 kg (07/24 0500) Last BM Date :  (PTA)  Weight change: Filed Weights   10/25/22 1543 10/25/22 2200 10/26/22 0500  Weight: 95 kg 101.1 kg 101.1 kg    Intake/Output:   Intake/Output Summary (Last 24 hours) at 10/26/2022 1152 Last data filed at 10/26/2022 1000 Gross per 24 hour  Intake 5180.01 ml  Output --  Net 5180.01 ml      Physical Exam    General:  Ill appearing. Sedated on vent. HEENT: + ETT Neck: supple. R internal jugular HD cath. Carotids 2+ bilat; no bruits.  Cor: PMI nondisplaced. Regular rate & rhythm. No rubs, gallops or murmurs. Lungs: clear Abdomen: soft, nontender, nondistended.  Extremities: no cyanosis, clubbing, rash, edema Neuro: Sedated on vent   Telemetry   Atrial fibrillation 100s  EKG    Initial ECG: Sinus tach 100 bpm  Labs   Basic Metabolic Panel: Recent Labs  Lab 10/25/22 1527 10/25/22 1539 10/25/22 1551 10/25/22 2050 10/26/22 0250 10/26/22 1014  NA 140 137 138 137 137  136 138  K 4.1 4.1 4.0 4.2 4.1  4.1 3.4*  CL 102  --  109  101 99  --   CO2 10*  --   --  16* 19*  --   GLUCOSE 103*  --  98 193* 272*  --   BUN 91*  --  83* 94* 98*  --   CREATININE 8.02*  --  8.70* 7.93* 7.54*  --   CALCIUM 7.7*  --   --  7.3* 6.8*  --   MG  --   --   --   --  1.8  --   PHOS  --   --   --   --  6.9*  --     Liver Function Tests: Recent Labs  Lab 10/25/22 1527  AST 163*  ALT 59*  ALKPHOS 133*  BILITOT 0.8  PROT 6.7  ALBUMIN 2.9*   No results for input(s): "  LIPASE", "AMYLASE" in the last 168 hours. No results for input(s): "AMMONIA" in the last 168 hours.  CBC: Recent Labs  Lab 10/25/22 1527 10/25/22 1539 10/25/22 1551 10/25/22 2154 10/26/22 0250 10/26/22 1005 10/26/22 1014  WBC 6.7  --   --  10.6* 11.4* 9.3  --   NEUTROABS  --   --   --  9.4*  --  8.0*  --   HGB 10.4*   < > 15.6 9.6* 8.5*  8.8* 8.3* 8.8*  HCT 35.1*   < > 46.0 30.8* 26.3*  26.0* 25.5* 26.0*  MCV 90.0  --   --  85.6 81.9 82.3  --   PLT 74*  --   --  53* 53* 50*  --    < > = values in this interval not displayed.    Cardiac Enzymes: No results for input(s): "CKTOTAL", "CKMB", "CKMBINDEX", "TROPONINI" in the last 168 hours.  BNP: BNP (last 3 results) Recent Labs    10/25/22 1528  BNP 719.5*    ProBNP (last 3 results) No results for input(s): "PROBNP" in the last 8760 hours.   CBG: Recent Labs  Lab 10/25/22 2353 10/26/22 0127 10/26/22 0348 10/26/22 0752 10/26/22 1101  GLUCAP 231* 276* 245* 183* 174*    Coagulation Studies: No results for input(s): "LABPROT", "INR" in the last 72 hours.   Imaging   ECHOCARDIOGRAM COMPLETE  Result Date: 10/26/2022    ECHOCARDIOGRAM REPORT   Patient Name:   Troy Johnston Date of Exam: 10/26/2022 Medical Rec #:  [id number]        Height:       68.0 in Accession #:    [id number]       Weight:       222.9 lb Date of Birth:  01-02-1951        BSA:          2.140 m Patient Age:    71 years         BP:           110/79 mmHg Patient Gender: M                HR:           103 bpm. Exam  Location:  Inpatient Procedure: 2D Echo, Cardiac Doppler and Color Doppler STAT ECHO         REPORT CONTAINS CRITICAL RESULT  Findings communicated to Dr. Servando Salina and Dr. [name]. Indications:    R00.9* Unspecified abnormalities of heart beat. Cardiac arrest  History:        Patient has no prior history of Echocardiogram examinations.                 Abnormal ECG; Arrythmias:Cardiac Arrest.  Sonographer:    Sheralyn Boatman RDCS Referring Phys: 49 RAKESH V ALVA  Sonographer Comments: Patient is obese and echo performed with patient supine and on artificial respirator. Dr. [name] present in room. Stat echo delayed. Patient was not stable. IMPRESSIONS  1. Left ventricular ejection fraction, by estimation, is 20 to 25%. The left ventricle has severely decreased function. The left ventricle demonstrates global hypokinesis. There is moderate concentric left ventricular hypertrophy. Left ventricular diastolic function could not be evaluated.  2. Right ventricular systolic function is severely reduced. The right ventricular size is normal. Tricuspid regurgitation signal is inadequate for assessing PA pressure.  3. Left atrial size was mildly dilated.  4. Right atrial size was mildly dilated.  5. The mitral valve is normal  in structure. Trivial mitral valve regurgitation. No evidence of mitral stenosis.  6. The aortic valve is tricuspid. There is mild calcification of the aortic valve. There is mild thickening of the aortic valve. Aortic valve regurgitation is not visualized. No aortic stenosis is present. Comparison(s): No prior Echocardiogram. Conclusion(s)/Recommendation(s): Global ventricular dysfunction with normal ventricular sizes, no significant valve disease. FINDINGS  Left Ventricle: Left ventricular ejection fraction, by estimation, is 20 to 25%. The left ventricle has severely decreased function. The left ventricle demonstrates global hypokinesis. The left ventricular internal cavity size was normal in size. There is  moderate concentric left ventricular hypertrophy. Left ventricular diastolic function could not be evaluated due to atrial fibrillation. Left ventricular diastolic function could not be evaluated. Right Ventricle: The right ventricular size is normal. No increase in right ventricular wall thickness. Right ventricular systolic function is severely reduced. Tricuspid regurgitation signal is inadequate for assessing PA pressure. Left Atrium: Left atrial size was mildly dilated. Right Atrium: Right atrial size was mildly dilated. Pericardium: Trivial pericardial effusion is present. Mitral Valve: The mitral valve is normal in structure. Trivial mitral valve regurgitation. No evidence of mitral valve stenosis. Tricuspid Valve: The tricuspid valve is normal in structure. Tricuspid valve regurgitation is trivial. No evidence of tricuspid stenosis. Aortic Valve: The aortic valve is tricuspid. There is mild calcification of the aortic valve. There is mild thickening of the aortic valve. Aortic valve regurgitation is not visualized. No aortic stenosis is present. Pulmonic Valve: The pulmonic valve was not well visualized. Pulmonic valve regurgitation is trivial. No evidence of pulmonic stenosis. Aorta: The aortic root and ascending aorta are structurally normal, with no evidence of dilitation. Venous: IVC assessment for right atrial pressure unable to be performed due to mechanical ventilation. IAS/Shunts: The atrial septum is grossly normal.  LEFT VENTRICLE PLAX 2D LVIDd:         4.20 cm LVIDs:         3.40 cm LV PW:         1.55 cm LV IVS:        1.75 cm LVOT diam:     2.20 cm LV SV:         42 LV SV Index:   20 LVOT Area:     3.80 cm  LV Volumes (MOD) LV vol d, MOD A4C: 84.7 ml LV vol s, MOD A4C: 67.4 ml LV SV MOD A4C:     84.7 ml RIGHT VENTRICLE         IVC TAPSE (M-mode): 0.8 cm  IVC diam: 2.10 cm LEFT ATRIUM             Index        RIGHT ATRIUM           Index LA diam:        3.90 cm 1.82 cm/m   RA Area:     19.70  cm LA Vol (A2C):   76.7 ml 35.84 ml/m  RA Volume:   50.90 ml  23.78 ml/m LA Vol (A4C):   64.6 ml 30.19 ml/m LA Biplane Vol: 72.3 ml 33.78 ml/m  AORTIC VALVE LVOT Vmax:   92.20 cm/s LVOT Vmean:  58.533 cm/s LVOT VTI:    0.111 m  AORTA Ao Root diam: 2.95 cm Ao Asc diam:  3.30 cm MITRAL VALVE MV Area (PHT): 5.13 cm    SHUNTS MV Decel Time: 148 msec    Systemic VTI:  0.11 m MV E velocity: 64.50 cm/s  Systemic Diam: 2.20 cm  Jodelle Red MD Electronically signed by Jodelle Red MD Signature Date/Time: 10/26/2022/10:56:27 AM    Final    Portable Chest x-ray  Result Date: 10/26/2022 CLINICAL DATA:  Intubation EXAM: PORTABLE CHEST 1 VIEW COMPARISON:  10/26/2022 FINDINGS: Interval placement of endotracheal tube with distal tip tube terminating approximately 4.0 cm above the carina. Right IJ central venous catheter remains positioned in the SVC. Stable cardiomegaly. Pulmonary vascular congestion. Similar bilateral interstitial prominence. No pneumothorax. IMPRESSION: Interval placement of endotracheal tube with distal tip tube terminating approximately 4.0 cm above the carina. Electronically Signed   By: Duanne Guess D.O.   On: 10/26/2022 10:40   DG CHEST PORT 1 VIEW  Result Date: 10/26/2022 CLINICAL DATA:  72 year old male status post central line placement. EXAM: PORTABLE CHEST 1 VIEW COMPARISON:  Chest x-ray 10/25/2022. FINDINGS: Right internal jugular central venous catheter with tip terminating in the distal superior vena cava. Lung volumes are low. Probable bibasilar subsegmental atelectasis. Possible trace left pleural effusion. No pneumothorax. No definite right pleural effusion. Mild cephalization of the pulmonary vasculature, without frank pulmonary edema. Mild cardiomegaly. Upper mediastinal contours are distorted by patient's rotation to the left. IMPRESSION: 1. Support apparatus, as above. 2. Low lung volumes with probable bibasilar subsegmental atelectasis and trace left pleural  effusion. 3. Cardiomegaly with pulmonary venous congestion, but no frank pulmonary edema. Electronically Signed   By: Trudie Reed M.D.   On: 10/26/2022 06:07   US RENAL  Result Date: 10/25/2022 CLINICAL DATA:  Acute kidney injury. EXAM: RENAL / URINARY TRACT ULTRASOUND COMPLETE COMPARISON:  None Available. FINDINGS: Right Kidney: Renal measurements: 11.1 cm x 6.1 cm x 4.6 cm = volume: 164.38 mL. Echogenicity within normal limits. There is mild right-sided hydronephrosis. A 4.1 cm x 3.0 cm x 3.6 cm simple cyst is seen within the lower pole of the right kidney. Left Kidney: Renal measurements: 10.1 cm x 5.1 cm x 4.1 cm = volume: 112.15 mL. Echogenicity within normal limits. No mass or hydronephrosis visualized. Bladder: The urinary bladder is poorly visualized. Other: None. IMPRESSION: 1. Large, simple right renal cyst. 2. Mild right-sided hydronephrosis. Electronically Signed   By: Aram Candela M.D.   On: 10/25/2022 20:11   DG Chest Port 1 View  Result Date: 10/25/2022 CLINICAL DATA:  Shortness of breath. EXAM: PORTABLE CHEST 1 VIEW COMPARISON:  11/14/2019. FINDINGS: Enlarged cardiac silhouette. Low lung volume study with possible retrocardiac opacities. No visible pleural effusions or pneumothorax. No acute bony abnormality. Left shoulder osteoarthritis. IMPRESSION: 1. Low lung volume study with possible retrocardiac opacities, most likely atelectasis. If there is concern for infection, dedicated PA and lateral radiographs could better assess. 2. Cardiomegaly. Electronically Signed   By: Feliberto Harts M.D.   On: 10/25/2022 16:36     Medications:     Current Medications:  Chlorhexidine Gluconate Cloth  6 each Topical Daily   docusate  100 mg Per Tube BID   hydrocortisone sod succinate (SOLU-CORTEF) inj  100 mg Intravenous Q12H   insulin aspart  0-20 Units Subcutaneous Q4H   insulin glargine-yfgn  5 Units Subcutaneous Daily   mouth rinse  15 mL Mouth Rinse Q2H   pantoprazole  (PROTONIX) IV  40 mg Intravenous Q24H   polyethylene glycol  17 g Per Tube Daily    Infusions:  sodium chloride 250 mL (10/26/22 1040)   sodium chloride     amiodarone Stopped (10/26/22 0941)   amiodarone     epinephrine 28 mcg/min (10/26/22 1048)   heparin 1,250 Units/hr (10/26/22 1000)  meropenem (MERREM) IV 1 g (10/26/22 1041)   norepinephrine (LEVOPHED) Adult infusion 40 mcg/min (10/26/22 1000)   vasopressin 0.04 Units/min (10/26/22 1000)      Patient Profile   72 y.o. male with history of DM II, HTN, CKD IIIb/IV, prostate cancer. Admitted with shock, metabolic acidosis and AKI on CKD.   Suffered cardiac arrest and found to have bacteremia and new systolic CHF. Advanced Heart Failure asked to see to assist with managing shock.  Assessment/Plan   Shock -Likely septic + ? 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  -BC + for Enterobacter cloacae. Now on meropenem per CCM.  -Stress dose steroids -Lactic acid 6.8>>4, up to 6.3 this afternoon post arrest. Follow to clearance. -Currently on 0.04 vasopressin, 40 NE and 30>>12 Epi. CO-OX 75% this am on high doses of NE and Epi. Pressor requirement starting to improve. -Echo: EF 20-25%, moderate LVH, RV severely reduced -HS troponin 2,879>2,461>1,284, flat trend. Suspect demand ischemia but does have risk factors for CAD -Recheck echo once off improved/off pressors  2. 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 CCM considering placing another line, can monitor CVP if central line placed -He is anuric. May need CRRT soon for volume management  3. Cardiac arrest: -07/23-Became bradycardic and had PEA arrest, CPR with ROSC after 10 minutes. Then had Vfib s/p defibrillation >>Afib.  4. Elevated troponin -Peaked at 2,879, flat trend -Suspect demand ischemia as above -Family reports he had not been complaining of any chest pain  5. AKI on CKD  IV Metabolic acidosis -Scr baseline around 2.4, peaked at 8.7. AKI in setting of shock. -Anuric. HD cath placed. Nephrology following. May need CRRT in next 24 hrs -On bicarb gtt -CO2 10>>21  6. Atrial fibrillation with RVR -New -Continue amiodarone at 30/hr, rate control okay low 100s -On heparin gtt  7. Elevated LFTs -Likely shock liver -Follow  8. Acute hypoxic respiratory failure -Vent management per CCM  9. DM II -A1c 7.5% -Per primary  10. Anemia History of iron deficiency -Hgb 15.6>>>8.4 -No obvious source of bleeding -T sat 7%, consider IV iron once infection treated  11. Thrombocytopenia -? D/t septic shock -Platelets 53>49K -Follow closely   GOC: GOC discussed with POA, his nephew, after cardiac arrest. Now DNR. Continue aggressive medical management.  Length of Stay: 1  FINCH, LINDSAY N, PA-C  10/26/2022, 11:52 AM  Advanced Heart Failure Team Pager [phone] (M-F; 7a - 5p)  Please contact [facility] for night-coverage after hours (4p -7a ) and weekends on [email or web address]   Agree with above.   72 y/o male with DM2, CKD IV (baseline Scr 2.4) and no known h/o heart disease  admitted with shock. Found to have enterobacter sepsis.  Developed respiratory failure and ESRD. Initial hstrop 2,800 -> down. Then had PEA -> VF arrest. Then developed AF   Echo EF 20-25% global HK and severe RV failure  ECG ok  Now intubated on NE and epi and vaso.   General:  Intubated/sedated HEENT: normal Neck: supple. Internal jugular cath  Cor: PMI nondisplaced. Regular tachy No rubs, gallops or murmurs. Lungs: clear Abdomen: soft, nontender, nondistended. No hepatosplenomegaly. No bruits or masses. Good bowel sounds. Extremities: no cyanosis, clubbing, rash, edema Neuro: intubated sedated  He is critically ill. Suspect sepsis is initiating event with likely Type II NSTEMI due to demand ischemia and now possible septic CM vs stunned myocardium (ischemic mediated)    Currently not candidate for invasive w/u or  mechanical support.   Agree with current support with pressors/inotropes/amio and heparin. We will follow with you.  Prognosis tenuous.   CRITICAL CARE Performed by: Arvilla Meres  Total critical care time: 60 minutes  Critical care time was exclusive of separately billable procedures and treating other patients.  Critical care was necessary to treat or prevent imminent or life-threatening deterioration.  Critical care was time spent personally by me (independent of midlevel providers or residents) on the following activities: development of treatment plan with patient and/or surrogate as well as nursing, discussions with consultants, evaluation of patient's response to treatment, examination of patient, obtaining history from patient or surrogate, ordering and performing treatments and interventions, ordering and review of laboratory studies, ordering and review of radiographic studies, pulse oximetry and re-evaluation of patient's condition.  Arvilla Meres, MD  5:30 PM

## 2022-10-26 NOTE — Procedures (Signed)
Central Venous Catheter Insertion Procedure Note  Troy Johnston  [id number]  27-Aug-1950  Date:10/26/22  Time:5:56 AM   Provider Performing:Carney Saxton   Procedure: Insertion of Non-tunneled Central Venous Catheter(36556)with US guidance (40981)    Indication(s) Hemodialysis  Consent Risks of the procedure as well as the alternatives and risks of each were explained to the patient and/or caregiver.  Consent for the procedure was obtained and is signed in the bedside chart  Anesthesia Topical only with 1% lidocaine   Timeout Verified patient identification, verified procedure, site/side was marked, verified correct patient position, special equipment/implants available, medications/allergies/relevant history reviewed, required imaging and test results available.  Sterile Technique Maximal sterile technique including full sterile barrier drape, hand hygiene, sterile gown, sterile gloves, mask, hair covering, sterile ultrasound probe cover (if used).  Procedure Description Area of catheter insertion was cleaned with chlorhexidine and draped in sterile fashion.   Ultrasound was used to identify vascular anatomy.  With real-time ultrasound guidance in real-time a HD catheter was placed into the right internal jugular vein.  Nonpulsatile blood flow and easy flushing noted in all ports.  The catheter was sutured in place and sterile dressing applied.  Complications/Tolerance None; patient tolerated the procedure well. Chest X-ray is ordered to verify placement for internal jugular or subclavian cannulation.  Chest x-ray is not ordered for femoral cannulation.  EBL Minimal  Specimen(s) None    Troy Greathouse MD [name] See Amion for pager  If no response to pager , please call [phone] until 7pm After 7:00 pm call Elink  [phone] 10/26/2022, 5:57 AM

## 2022-10-26 NOTE — Procedures (Signed)
Arterial Catheter Insertion Procedure Note  SUNIL HUE  [id number]  1950-05-05  Date:10/26/22  Time:2:43 AM    Provider Performing: Allena Napoleon    Procedure: Insertion of Arterial Line (82956) without US guidance  Indication(s) Blood pressure monitoring and/or need for frequent ABGs  Consent Unable to obtain consent due to emergent nature of procedure.  Anesthesia None   Time Out Verified patient identification, verified procedure, site/side was marked, verified correct patient position, special equipment/implants available, medications/allergies/relevant history reviewed, required imaging and test results available.   Sterile Technique Maximal sterile technique including full sterile barrier drape, hand hygiene, sterile gown, sterile gloves, mask, hair covering, sterile ultrasound probe cover (if used).   Procedure Description Area of catheter insertion was cleaned with chlorhexidine and draped in sterile fashion. Without real-time ultrasound guidance an arterial catheter was placed into the right radial artery.  Appropriate arterial tracings confirmed on monitor.     Complications/Tolerance None; patient tolerated the procedure well.   EBL Minimal   Specimen(s) None

## 2022-10-26 NOTE — Progress Notes (Signed)
eLink Physician-Brief Progress Note Patient Name: Troy Johnston DOB: [birth year] MRN: [id number]   Date of Service  10/26/2022  HPI/Events of Note  72 year old male with a history metabolic syndrome, chronic kidney disease stage IV, and diabetes who initially presented with shortness of breath found to be in shock.  Bradycardia/PEA arrest this morning.  Now intubated on maximal pressor support.  eICU Interventions  Insert feeding tube, KUB ordered      Intervention Category Minor Interventions: Routine modifications to care plan (e.g. PRN medications for pain, fever)  Kaiyan Luczak 10/26/2022, 9:11 PM

## 2022-10-26 NOTE — Progress Notes (Signed)
ANTICOAGULATION CONSULT NOTE - Follow Up  Pharmacy Consult for Heparin Indication: chest pain/ACS  Allergies  Allergen Reactions   Ferrous Sulfate Other (See Comments)   Penicillins Hives and Other (See Comments)    Has patient had a PCN reaction causing immediate rash, facial/tongue/throat swelling, SOB or lightheadedness with hypotension: no Has patient had a PCN reaction causing severe rash involving mucus membranes or skin necrosis: no Has patient had a PCN reaction that required hospitalization: yes Has patient had a PCN reaction occurring within the last 10 years: no If all of the above answers are "NO", then may proceed with Cephalosporin use.    Patient Measurements: Height: 5\' 8"  (172.7 cm) Weight: 101.1 kg (222 lb 14.2 oz) IBW/kg (Calculated) : 68.4 Heparin Dosing Weight: 88.3 kg  Vital Signs: Temp: 97.8 F (36.6 C) (07/24 1103) Temp Source: Oral (07/24 1103) BP: 161/75 (07/24 0915) Pulse Rate: 75 (07/24 1345)  Labs: Recent Labs    10/25/22 1658 10/25/22 2050 10/25/22 2154 10/26/22 0250 10/26/22 1005 10/26/22 1014 10/26/22 1139  HGB  --   --    < > 8.5*  8.8* 8.3* 8.8* 8.4*  HCT  --   --    < > 26.3*  26.0* 25.5* 26.0* 25.9*  PLT  --   --    < > 53* 50*  --  49*  HEPARINUNFRC  --   --   --  0.10*  --   --  0.12*  CREATININE  --  7.93*  --  7.54* 7.89*  --   --   TROPONINIHS 2,461*  --   --   --  1,284*  --  1,375*   < > = values in this interval not displayed.    Estimated Creatinine Clearance: 9.6 mL/min (A) (by C-G formula based on SCr of 7.89 mg/dL (H)).   Medical History: Past Medical History:  Diagnosis Date   Diabetes mellitus without complication (HCC)    GERD (gastroesophageal reflux disease)    Hypertension    Prostate cancer (HCC)     Medications:  Medications Prior to Admission  Medication Sig Dispense Refill Last Dose   allopurinol (ZYLOPRIM) 100 MG tablet Take 50 mg by mouth every other day. For high uric acid level   unk    Alogliptin Benzoate 12.5 MG TABS Take 6.25 mg by mouth every morning. For diabetes (replaces metformin)   unk   atorvastatin (LIPITOR) 20 MG tablet Take 20 mg by mouth at bedtime. For cholesterol   unk   carvedilol (COREG) 25 MG tablet Take 12.5 mg by mouth 2 (two) times daily with a meal.   un   glipiZIDE (GLUCOTROL) 10 MG tablet Take 5 mg by mouth 2 (two) times daily before a meal. For diabetes   unk   hydrALAZINE (APRESOLINE) 100 MG tablet Take 100 mg by mouth every 8 (eight) hours.   unk   losartan (COZAAR) 100 MG tablet Take 100 mg by mouth daily.   unk   Scheduled:   atorvastatin  20 mg Per Tube QHS   Chlorhexidine Gluconate Cloth  6 each Topical Daily   docusate  100 mg Per Tube BID   hydrocortisone sod succinate (SOLU-CORTEF) inj  100 mg Intravenous Q12H   insulin aspart  0-20 Units Subcutaneous Q4H   insulin glargine-yfgn  5 Units Subcutaneous Daily   mouth rinse  15 mL Mouth Rinse Q2H   pantoprazole (PROTONIX) IV  40 mg Intravenous Q24H   polyethylene glycol  17 g Per  Tube Daily   Infusions:   sodium chloride 10 mL/hr at 10/26/22 1300   sodium chloride     amiodarone Stopped (10/26/22 1420)   epinephrine 12 mcg/min (10/26/22 1300)   heparin Stopped (10/26/22 1018)   meropenem (MERREM) IV Stopped (10/26/22 1111)   norepinephrine (LEVOPHED) Adult infusion 39 mcg/min (10/26/22 1300)   vasopressin 0.04 Units/min (10/26/22 1300)   PRN: Place/Maintain arterial line **AND** sodium chloride, acetaminophen, docusate sodium, fentaNYL (SUBLIMAZE) injection, fentaNYL (SUBLIMAZE) injection, midazolam, mouth rinse, mouth rinse, polyethylene glycol  Assessment: 71 yom with a history of DM, prostate cancer remission, PUD and GIB. Patient is presenting with SOB. Heparin per pharmacy consult placed for chest pain/ACS  Heparin held 7/24 AM after cardiac arrest. Cleared by CCM to restart. Will be cautious in dosing given platelets of 49  Goal of Therapy:  Heparin level 0.3-0.7  units/ml Monitor platelets by anticoagulation protocol: Yes   Plan:  Start heparin 1350 units/hr without bolus Check 8hr heparin level Monitor CBC for plt trend  Eldridge Scot, PharmD Clinical Pharmacist 10/26/2022, 2:40 PM

## 2022-10-26 NOTE — Progress Notes (Signed)
Progress Note  Patient Name: Troy Johnston Date of Encounter: 10/26/2022  Primary Cardiologist: None   Subjective   Patient seen examined his at his bedside events overnight and this morning.  Went into atrial fibrillation now on amiodarone.  Had this morning now is intubated for airway protection.  Inpatient Medications    Scheduled Meds:  Chlorhexidine Gluconate Cloth  6 each Topical Daily   docusate  100 mg Per Tube BID   hydrocortisone sod succinate (SOLU-CORTEF) inj  100 mg Intravenous Q12H   insulin aspart  0-20 Units Subcutaneous Q4H   insulin glargine-yfgn  5 Units Subcutaneous Daily   mouth rinse  15 mL Mouth Rinse Q2H   pantoprazole (PROTONIX) IV  40 mg Intravenous Q24H   polyethylene glycol  17 g Per Tube Daily   Continuous Infusions:  sodium chloride 250 mL (10/26/22 1040)   sodium chloride     amiodarone 60 mg/hr (10/26/22 0810)   amiodarone     heparin 1,250 Units/hr (10/26/22 0700)   magnesium sulfate bolus IVPB 2 g (10/26/22 1037)   meropenem (MERREM) IV     norepinephrine (LEVOPHED) Adult infusion 40 mcg/min (10/26/22 0956)   vasopressin 0.04 Units/min (10/26/22 0700)   PRN Meds: Place/Maintain arterial line **AND** sodium chloride, acetaminophen, docusate sodium, fentaNYL (SUBLIMAZE) injection, fentaNYL (SUBLIMAZE) injection, midazolam, mouth rinse, mouth rinse, polyethylene glycol   Vital Signs    Vitals:   10/26/22 0645 10/26/22 0700 10/26/22 0735 10/26/22 0935  BP:      Pulse: 91 100    Resp: (!) 45 (!) 41    Temp:   98.3 F (36.8 C)   TempSrc:   Oral   SpO2: 100% 99%    Weight:      Height:    5\' 8"  (1.727 m)    Intake/Output Summary (Last 24 hours) at 10/26/2022 1041 Last data filed at 10/26/2022 0700 Gross per 24 hour  Intake 4284.63 ml  Output --  Net 4284.63 ml   Filed Weights   10/25/22 1543 10/25/22 2200 10/26/22 0500  Weight: 95 kg 101.1 kg 101.1 kg    Telemetry    Atrial fibrillation with rapid ventricular rate-  Personally Reviewed  ECG    Atrial fibrillation with rapid ventricular rate- Personally Reviewed  Physical Exam     General: Intubated Head: Atraumatic, normal size  Eyes: PEERLA, EOMI  Neck: Supple, normal JVD Cardiac: Normal S1, S2; RRR; no murmurs, rubs, or gallops Lungs: rhonchial breath sound Abd: truncal obesity, Soft, nontender, no hepatomegaly  Ext: warm, no edema Musculoskeletal: No deformities, BUE and BLE strength normal and equal Skin: Warm and dry, no rashes   Neuro: Alert and oriented to person, place, time, and situation, CNII-XII grossly intact, no focal deficits  Psych: Normal mood and affect   Labs    Chemistry Recent Labs  Lab 10/25/22 1527 10/25/22 1539 10/25/22 1551 10/25/22 2050 10/26/22 0250 10/26/22 1014  NA 140   < > 138 137 137  136 138  K 4.1   < > 4.0 4.2 4.1  4.1 3.4*  CL 102  --  109 101 99  --   CO2 10*  --   --  16* 19*  --   GLUCOSE 103*  --  98 193* 272*  --   BUN 91*  --  83* 94* 98*  --   CREATININE 8.02*  --  8.70* 7.93* 7.54*  --   CALCIUM 7.7*  --   --  7.3* 6.8*  --  PROT 6.7  --   --   --   --   --   ALBUMIN 2.9*  --   --   --   --   --   AST 163*  --   --   --   --   --   ALT 59*  --   --   --   --   --   ALKPHOS 133*  --   --   --   --   --   BILITOT 0.8  --   --   --   --   --   GFRNONAA 7*  --   --  7* 7*  --   ANIONGAP 28*  --   --  20* 19*  --    < > = values in this interval not displayed.     Hematology Recent Labs  Lab 10/25/22 1527 10/25/22 1539 10/25/22 2154 10/26/22 0250 10/26/22 1014  WBC 6.7  --  10.6* 11.4*  --   RBC 3.90*  --  3.60* 3.21*  --   HGB 10.4*   < > 9.6* 8.5*  8.8* 8.8*  HCT 35.1*   < > 30.8* 26.3*  26.0* 26.0*  MCV 90.0  --  85.6 81.9  --   MCH 26.7  --  26.7 26.5  --   MCHC 29.6*  --  31.2 32.3  --   RDW 14.2  --  14.0 14.0  --   PLT 74*  --  53* 53*  --    < > = values in this interval not displayed.    Cardiac EnzymesNo results for input(s): "TROPONINI" in the last 168  hours. No results for input(s): "TROPIPOC" in the last 168 hours.   BNP Recent Labs  Lab 10/25/22 1528  BNP 719.5*     DDimer  Recent Labs  Lab 10/25/22 2154  DDIMER 13.93*     Radiology    DG CHEST PORT 1 VIEW  Result Date: 10/26/2022 CLINICAL DATA:  72 year old male status post central line placement. EXAM: PORTABLE CHEST 1 VIEW COMPARISON:  Chest x-ray 10/25/2022. FINDINGS: Right internal jugular central venous catheter with tip terminating in the distal superior vena cava. Lung volumes are low. Probable bibasilar subsegmental atelectasis. Possible trace left pleural effusion. No pneumothorax. No definite right pleural effusion. Mild cephalization of the pulmonary vasculature, without frank pulmonary edema. Mild cardiomegaly. Upper mediastinal contours are distorted by patient's rotation to the left. IMPRESSION: 1. Support apparatus, as above. 2. Low lung volumes with probable bibasilar subsegmental atelectasis and trace left pleural effusion. 3. Cardiomegaly with pulmonary venous congestion, but no frank pulmonary edema. Electronically Signed   By: Trudie Reed M.D.   On: 10/26/2022 06:07   US RENAL  Result Date: 10/25/2022 CLINICAL DATA:  Acute kidney injury. EXAM: RENAL / URINARY TRACT ULTRASOUND COMPLETE COMPARISON:  None Available. FINDINGS: Right Kidney: Renal measurements: 11.1 cm x 6.1 cm x 4.6 cm = volume: 164.38 mL. Echogenicity within normal limits. There is mild right-sided hydronephrosis. A 4.1 cm x 3.0 cm x 3.6 cm simple cyst is seen within the lower pole of the right kidney. Left Kidney: Renal measurements: 10.1 cm x 5.1 cm x 4.1 cm = volume: 112.15 mL. Echogenicity within normal limits. No mass or hydronephrosis visualized. Bladder: The urinary bladder is poorly visualized. Other: None. IMPRESSION: 1. Large, simple right renal cyst. 2. Mild right-sided hydronephrosis. Electronically Signed   By: Aram Candela M.D.   On: 10/25/2022  20:11   DG Chest Port 1  View  Result Date: 10/25/2022 CLINICAL DATA:  Shortness of breath. EXAM: PORTABLE CHEST 1 VIEW COMPARISON:  11/14/2019. FINDINGS: Enlarged cardiac silhouette. Low lung volume study with possible retrocardiac opacities. No visible pleural effusions or pneumothorax. No acute bony abnormality. Left shoulder osteoarthritis. IMPRESSION: 1. Low lung volume study with possible retrocardiac opacities, most likely atelectasis. If there is concern for infection, dedicated PA and lateral radiographs could better assess. 2. Cardiomegaly. Electronically Signed   By: Feliberto Harts M.D.   On: 10/25/2022 16:36    Cardiac Studies     Patient Profile     72 y.o. male diabetes mellitus, prostate cancer in remission, peptic ulcer disease, history of GI bleed and CKD.  Assessment & Plan    Cardiac arrest-PEA Acute respiratory failure Septic versus cardiogenic shock Gram-negative bacteremia NSTEMI Acute on chronic kidney disease Atrial fibrillation with rapid ventricular rate History of hypertension-now in shock and hypotensive Type 2 diabetes  His clinical picture is getting more to septic shock given his gram-negative rod bacteremia however this could be a mixed shock with a component that is cardiogenic. Recovery from cardiac arrest after ICU team treated with ACLS protocol. Agree with Levophed drip and vasopressin for MAP score greater than 65 Agree with amiodarone drip will monitor closely.  He is on should help with his NSTEMI as well newly diagnosed atrial fibrillation. His echocardiogram is pending Creatinine slightly improved but still borderline for consideration for renal replacement therapy. In terms of his NSTEMI I suspect type II at this time given his multiorgan failure clinical picture medical management is highly favorable over invasive approach at this time.  Discussed with team.  Will continue to follow with you.     CRITICAL CARE Performed by: Thomasene Ripple  Total critical care  time: 30 minutes. Critical care time was exclusive of separately billable procedures and treating other patients. Critical care was necessary to treat or prevent imminent or life-threatening deterioration. Critical care was time spent personally by me on the following activities: development of treatment plan with patient and/or surrogate as well as nursing, discussions with consultants, evaluation of patient's response to treatment, examination of patient, obtaining history from patient or surrogate, ordering and performing treatments and interventions, ordering and review of laboratory studies, ordering and review of radiographic studies, pulse oximetry and re-evaluation of patient's condition.    Signed, Thomasene Ripple, DO [facility]  [facility]  10/26/2022 10:41 AM    For questions or updates, please contact [facility] Please consult [email or web address] for contact info under Cardiology/STEMI.      Signed, Thomasene Ripple, DO  10/26/2022, 10:41 AM

## 2022-10-26 NOTE — Progress Notes (Signed)
  Interdisciplinary Goals of Care Family Meeting   Date carried out:: 10/26/2022  Location of the meeting: Unit  Member's involved: Physician and Family Member or next of kin  Durable Power of Attorney or acting medical decision maker: Nephew    Discussion: We discussed goals of care for Troy Johnston after his cardiac arrest.  Explained that he is on maximum pressors including epinephrine and blood pressure is still borderline.  Explained that CPR and this situation of maximal life support would be futile.  EF is down to 20%.  Code status: No CPR, continue supportive care including pressors  Disposition: Continue current acute care   Time spent for the meeting: 25 m  Troy Johnston 10/26/2022, 10:49 AM

## 2022-10-26 NOTE — Progress Notes (Signed)
  Echocardiogram 2D Echocardiogram has been performed.  Janalyn Harder 10/26/2022, 10:45 AM

## 2022-10-26 NOTE — Progress Notes (Signed)
Chaplain responded to Code [employer]. Pt was under the care of the medical team. Chaplain remained onsite until the pt was stabilized. No family present at the bedside.   Chaplain is available for follow up should family or patient desire further support.    Maryanna Shape. Carley Hammed, M.Div. [facility] Chaplain Pager [phone] Office [phone]     10/26/22 [phone]  Spiritual Encounters  Type of Visit Initial  Care provided to: Pt not available  Referral source Code page  Reason for visit Code  OnCall Visit Yes

## 2022-10-26 NOTE — Procedures (Signed)
Intubation Procedure Note  Troy Johnston  [id number]  11-06-1950  Date:10/26/22  Time:9:31 AM   Provider Performing:Oda Placke [name]    Procedure: Intubation (31500)  Indication(s) Respiratory Failure  Consent Unable to obtain consent due to emergent nature of procedure.   Anesthesia No meds   Time Out Verified patient identification, verified procedure, site/side was marked, verified correct patient position, special equipment/implants available, medications/allergies/relevant history reviewed, required imaging and test results available.   Sterile Technique Usual hand hygeine, masks, and gloves were used   Procedure Description Patient positioned in bed supine.  Sedation given as noted above.  Patient was intubated with endotracheal tube using Glidescope.  View was Grade 1 full glottis .  Number of attempts was 1.  Colorimetric CO2 detector was consistent with tracheal placement.   Complications/Tolerance None; patient tolerated the procedure well. Chest X-ray is ordered to verify placement.   EBL Minimal   Specimen(s) None  Troy Johnston V. [name] MD

## 2022-10-26 NOTE — Progress Notes (Signed)
PHARMACY - PHYSICIAN COMMUNICATION CRITICAL VALUE ALERT - BLOOD CULTURE IDENTIFICATION (BCID)  Troy Johnston is an 72 y.o. male who presented to [facility] on 10/25/2022 with a chief complaint of shortness of breath.   Assessment:  72 year old male admitted with septic shock. Now with enterobacter cloacae complex in 1/3 blood cultures.   Name of physician (or Provider) Contacted: CCM team   Current antibiotics: aztreonam/flagyl/vanc  Changes to prescribed antibiotics recommended:  Preferred regimens would be cefepime vs. Meropenem  CCM elected for meropenem due to concern for neurotoxicity   Results for orders placed or performed during the hospital encounter of 10/25/22  Blood Culture ID Panel (Reflexed) (Collected: 10/25/2022  4:58 PM)  Result Value Ref Range   Enterococcus faecalis NOT DETECTED NOT DETECTED   Enterococcus Faecium NOT DETECTED NOT DETECTED   Listeria monocytogenes NOT DETECTED NOT DETECTED   Staphylococcus species NOT DETECTED NOT DETECTED   Staphylococcus aureus (BCID) NOT DETECTED NOT DETECTED   Staphylococcus epidermidis NOT DETECTED NOT DETECTED   Staphylococcus lugdunensis NOT DETECTED NOT DETECTED   Streptococcus species NOT DETECTED NOT DETECTED   Streptococcus agalactiae NOT DETECTED NOT DETECTED   Streptococcus pneumoniae NOT DETECTED NOT DETECTED   Streptococcus pyogenes NOT DETECTED NOT DETECTED   A.calcoaceticus-baumannii NOT DETECTED NOT DETECTED   Bacteroides fragilis NOT DETECTED NOT DETECTED   Enterobacterales DETECTED (A) NOT DETECTED   Enterobacter cloacae complex DETECTED (A) NOT DETECTED   Escherichia coli NOT DETECTED NOT DETECTED   Klebsiella aerogenes NOT DETECTED NOT DETECTED   Klebsiella oxytoca NOT DETECTED NOT DETECTED   Klebsiella pneumoniae NOT DETECTED NOT DETECTED   Proteus species NOT DETECTED NOT DETECTED   Salmonella species NOT DETECTED NOT DETECTED   Serratia marcescens NOT DETECTED NOT DETECTED   Haemophilus influenzae  NOT DETECTED NOT DETECTED   Neisseria meningitidis NOT DETECTED NOT DETECTED   Pseudomonas aeruginosa NOT DETECTED NOT DETECTED   Stenotrophomonas maltophilia NOT DETECTED NOT DETECTED   Candida albicans NOT DETECTED NOT DETECTED   Candida auris NOT DETECTED NOT DETECTED   Candida glabrata NOT DETECTED NOT DETECTED   Candida krusei NOT DETECTED NOT DETECTED   Candida parapsilosis NOT DETECTED NOT DETECTED   Candida tropicalis NOT DETECTED NOT DETECTED   Cryptococcus neoformans/gattii NOT DETECTED NOT DETECTED   CTX-M ESBL NOT DETECTED NOT DETECTED   Carbapenem resistance IMP NOT DETECTED NOT DETECTED   Carbapenem resistance KPC NOT DETECTED NOT DETECTED   Carbapenem resistance NDM NOT DETECTED NOT DETECTED   Carbapenem resist OXA 48 LIKE NOT DETECTED NOT DETECTED   Carbapenem resistance VIM NOT DETECTED NOT DETECTED    Sharin Mons, PharmD, BCPS, BCIDP Infectious Diseases Clinical Pharmacist Phone: [phone] 10/26/2022  8:01 AM

## 2022-10-26 NOTE — Progress Notes (Signed)
Nephrology Follow-Up Consult note   Assessment/Recommendations: Troy Johnston is a/an 72 y.o. male with a past medical history significant for HTN, DM2, CKD, admitted for septic shock and enterococcus bacteremia.       AKI on CKD IV:  likely hemodynamically mediated in the setting of shock.  Acidotic, not hyperkalemic.  No metformin on board.             - supportive care- IVFs, pressors, antibiotics             - mild unilateral hydro, not a major concern             - stop bicarb gtt for now             - no acute indications for RRT yet but may need CRRT within next 24 hours   2.  Shock: septic with enterobacter bacteremia             - abx per primary             - TTE pending             - norepi and vaso per primary   3.  Elevated troponins/ NSTEMI/PEA arrest/Afib w/ RVR             - TTE pending- concern for PE possible endocarditis             - cardiology following             - on hep gtt   4.  Acute hypoxic RF:             - intubated  - vent per primary team   5.  Elevated LFTs             - most likely d/t tissue ischemia   6.  Thrombocytopenia:             - likely in setting of possible septic shock   7.  Dispo: to ICU   Recommendations conveyed to primary service.    [facility] 10/26/2022 10:13 AM  ___________________________________________________________  CC: SOB  Interval History/Subjective: Patient had increased pressor requirements overnight.  Blood cultures back with Enterobacter.  This morning patient had PEA arrest.  Was able to achieve ROSC.  Also had some ventricular fibrillation and A-fib with RVR.  No urine output documented.  Patient is now intubated   Medications:  Current Facility-Administered Medications  Medication Dose Route Frequency Provider Last Rate Last Admin   0.9 %  sodium chloride infusion  250 mL Intravenous Continuous Cyril Mourning V, MD 10 mL/hr at 10/26/22 0700 Infusion Verify at  10/26/22 0700   0.9 %  sodium chloride infusion   Intra-arterial PRN Paliwal, Eliezer Lofts, MD       acetaminophen (TYLENOL) tablet 650 mg  650 mg Oral Q4H PRN Oretha Milch, MD       amiodarone (NEXTERONE PREMIX) 360-4.14 MG/200ML-% (1.8 mg/mL) IV infusion  60 mg/hr Intravenous Continuous Paliwal, Aditya, MD 33.3 mL/hr at 10/26/22 0810 60 mg/hr at 10/26/22 0810   amiodarone (NEXTERONE PREMIX) 360-4.14 MG/200ML-% (1.8 mg/mL) IV infusion  30 mg/hr Intravenous Continuous Paliwal, Aditya, MD       Chlorhexidine Gluconate Cloth 2 % PADS 6 each  6 each Topical Daily Oretha Milch, MD   6 each at 10/25/22 2121   docusate (COLACE) 50 MG/5ML liquid 100 mg  100 mg Per Tube BID Modena Slater, DO  docusate sodium (COLACE) capsule 100 mg  100 mg Oral BID PRN Oretha Milch, MD       fentaNYL (SUBLIMAZE) 50 MCG/ML injection            fentaNYL (SUBLIMAZE) injection 25 mcg  25 mcg Intravenous Q15 min PRN Modena Slater, DO       fentaNYL (SUBLIMAZE) injection 25-100 mcg  25-100 mcg Intravenous Q30 min PRN Modena Slater, DO   100 mcg at 10/26/22 0933   heparin ADULT infusion 100 units/mL (25000 units/280mL)  1,250 Units/hr Intravenous Continuous Mickle, Campton, RPH 12.5 mL/hr at 10/26/22 0700 1,250 Units/hr at 10/26/22 0700   hydrocortisone sodium succinate (SOLU-CORTEF) 100 MG injection 100 mg  100 mg Intravenous Q12H Oretha Milch, MD       insulin aspart (novoLOG) injection 0-20 Units  0-20 Units Subcutaneous Q4H Modena Slater, DO       insulin glargine-yfgn (SEMGLEE) injection 5 Units  5 Units Subcutaneous Daily Patel, Amar, DO       magnesium sulfate IVPB 2 g 50 mL  2 g Intravenous Once Klus, Jesse, Student-PharmD       meropenem (MERREM) 1 g in sodium chloride 0.9 % 100 mL IVPB  1 g Intravenous Daily Oretha Milch, MD       midazolam (VERSED) injection 1-2 mg  1-2 mg Intravenous Q1H PRN Modena Slater, DO       norepinephrine (LEVOPHED) 16 mg in (0.064 mg/mL) premix infusion  0-40 mcg/min Intravenous Titrated  Oretha Milch, MD 37.5 mL/hr at 10/26/22 0956 40 mcg/min at 10/26/22 0956   Oral care mouth rinse  15 mL Mouth Rinse PRN Oretha Milch, MD       Oral care mouth rinse  15 mL Mouth Rinse Q2H Modena Slater, DO       Oral care mouth rinse  15 mL Mouth Rinse PRN Modena Slater, DO       pantoprazole (PROTONIX) injection 40 mg  40 mg Intravenous Q24H Patel, Amar, DO       polyethylene glycol (MIRALAX / GLYCOLAX) packet 17 g  17 g Oral Daily PRN Oretha Milch, MD       polyethylene glycol (MIRALAX / GLYCOLAX) packet 17 g  17 g Per Tube Daily Modena Slater, DO       sodium bicarbonate 150 mEq in sterile water 1,150 mL infusion   Intravenous Continuous Modena Slater, DO 150 mL/hr at 10/26/22 0934 New Bag at 10/26/22 0934   vasopressin (PITRESSIN) 20 Units in 100 mL (0.2 unit/mL) infusion-*FOR SHOCK*  0.04 Units/min Intravenous Continuous Paliwal, Aditya, MD 12 mL/hr at 10/26/22 0700 0.04 Units/min at 10/26/22 0700      Review of Systems: Unable to obtain due to the patient's sedation  Physical Exam: Vitals:   10/26/22 0700 10/26/22 0735  BP:    Pulse: 100   Resp: (!) 41   Temp:  98.3 F (36.8 C)  SpO2: 99%    No intake/output data recorded.  Intake/Output Summary (Last 24 hours) at 10/26/2022 1013 Last data filed at 10/26/2022 0700 Gross per 24 hour  Intake 4284.63 ml  Output --  Net 4284.63 ml   Constitutional: Ill-appearing lying in bed, sedated ENMT: ears and nose without scars or lesions, MMM CV: Tachycardia, trace edema in the ankles, JVD present Respiratory: Coarse bilateral breath sounds, bilateral chest rise, ventilated Gastrointestinal: soft, non-tender, no palpable masses or hernias Skin: no visible lesions or rashes Psych: Sedated, intubated   Test Results I personally  reviewed new and old clinical labs and radiology tests Lab Results  Component Value Date   NA 137 10/26/2022   NA 136 10/26/2022   K 4.1 10/26/2022   K 4.1 10/26/2022   CL 99 10/26/2022   CO2 19 (L)  10/26/2022   BUN 98 (H) 10/26/2022   CREATININE 7.54 (H) 10/26/2022   CALCIUM 6.8 (L) 10/26/2022   ALBUMIN 2.9 (L) 10/25/2022   PHOS 6.9 (H) 10/26/2022    CBC Recent Labs  Lab 10/25/22 1527 10/25/22 1539 10/25/22 1551 10/25/22 2154 10/26/22 0250  WBC 6.7  --   --  10.6* 11.4*  NEUTROABS  --   --   --  9.4*  --   HGB 10.4*   < > 15.6 9.6* 8.5*  8.8*  HCT 35.1*   < > 46.0 30.8* 26.3*  26.0*  MCV 90.0  --   --  85.6 81.9  PLT 74*  --   --  53* 53*   < > = values in this interval not displayed.     The patient is critically ill with septic shock and acute renal failure and which includes my role to primarily manage acute renal failure on CKD IV.  This requires high complexity decision making.  Total critical care time: 38 minutes    Critical care time was exclusive of treating other patients.   Critical care was necessary to treat or prevent imminent or life-threatening deterioration.   Critical care was time spent personally by me on the following activities:   development of treatment plan with patient and/or surrogate as well as nursing,   discussions with other provider evaluation of patient's response to treatment  examination of patient  obtaining history from patient or surrogate  ordering and performing treatments and interventions  ordering and review of laboratory studies  ordering and review of radiographic studies

## 2022-10-26 NOTE — Progress Notes (Signed)
Pharmacy Antibiotic Note  Troy Johnston is a 72 y.o. male admitted on 10/25/2022 with concerns for sepsis. Patient presented with SOB and initially hypotensive. Pharmacy has been consulted for vancomycin dosing. WBC 11.4, sCr 8.7>>7.54, afebrile, lactate 3.1.  Plan: Aztreonam 2g IV every 12 hours  Flagyl 500 mg IV every 8 hours per MD Vancomycin 1500 mg IV x1 Vancomycin per unstable renal function Monitor renal function given extensive AKI for dose adjusting antibiotics Follow up cultures, LOT, signs of clinical improvement, and WBC/fever curve  Height: 5\' 8"  (172.7 cm) Weight: 101.1 kg (222 lb 14.2 oz) IBW/kg (Calculated) : 68.4  Temp (24hrs), Avg:97.6 F (36.4 C), Min:96.8 F (36 C), Max:98.5 F (36.9 C)  Recent Labs  Lab 10/25/22 1527 10/25/22 1551 10/25/22 1614 10/25/22 1658 10/25/22 2050 10/25/22 2154 10/26/22 0250  WBC 6.7  --   --   --   --  10.6* 11.4*  CREATININE 8.02* 8.70*  --   --  7.93*  --   --   LATICACIDVEN  --   --  6.8* 5.7*  --  3.1*  --     Estimated Creatinine Clearance: 9.6 mL/min (A) (by C-G formula based on SCr of 7.93 mg/dL (H)).    Allergies  Allergen Reactions   Ferrous Sulfate Other (See Comments)   Penicillins Hives and Other (See Comments)    Has patient had a PCN reaction causing immediate rash, facial/tongue/throat swelling, SOB or lightheadedness with hypotension: no Has patient had a PCN reaction causing severe rash involving mucus membranes or skin necrosis: no Has patient had a PCN reaction that required hospitalization: yes Has patient had a PCN reaction occurring within the last 10 years: no If all of the above answers are "NO", then may proceed with Cephalosporin use.    Antimicrobials this admission: aztreonam 7/23 >>  flagyl 7/23 >>  Vanc 7/23 >>  Microbiology results: 7/23 BCx:  7/23 MRSA PCR: not detected  Thank you for allowing pharmacy to be a part of this patient's care.  Arabella Merles, PharmD. Clinical  Pharmacist 10/26/2022 3:49 AM

## 2022-10-26 NOTE — Procedures (Signed)
Cardiopulmonary Resuscitation Note  VERNELL TOWNLEY  [id number]  03/23/1951  Date:10/26/22  Time:9:33 AM   Provider Performing:Teira Arcilla V. Jaidan Prevette   Procedure: Cardiopulmonary Resuscitation [phone])  Indication(s) Loss of Pulse  Consent N/A  Anesthesia N/A   Time Out N/A   Sterile Technique Hand hygiene, gloves   Procedure Description Called to patient's room for CODE BLUE. Initial rhythm was PEA/Asystole. Patient received high quality chest compressions for 10 minutes with defibrillation or cardioversion when appropriate. Epinephrine was administered every 3 minutes as directed by time Biomedical engineer. Additional pharmacologic interventions included calcium chloride and sodium bicarbonate. Additional procedural interventions include intubation.  Return of spontaneous circulation was achieved. After ROSC, developed V fib , reverted to AF-RVR with shock x 1  Family at bedside.   Complications/Tolerance N/A   EBL N/A   Specimen(s) N/A  Estimated time to ROSC: 10 minutes  Barnett Elzey V. [name] MD

## 2022-10-26 NOTE — Progress Notes (Signed)
PCCM note  Patient has increasing Levophed requirements and remains anuric HD cath placed for pressors and anticipated need for CVVH  Chilton Greathouse MD [facility] See Amion for pager  If no response to pager , please call [phone] until 7pm After 7:00 pm call Elink  [phone] 10/26/2022, 5:56 AM

## 2022-10-26 NOTE — Progress Notes (Signed)
ANTICOAGULATION CONSULT NOTE - Follow Up  Pharmacy Consult for Heparin Indication: chest pain/ACS  Allergies  Allergen Reactions   Ferrous Sulfate Other (See Comments)   Penicillins Hives and Other (See Comments)    Has patient had a PCN reaction causing immediate rash, facial/tongue/throat swelling, SOB or lightheadedness with hypotension: no Has patient had a PCN reaction causing severe rash involving mucus membranes or skin necrosis: no Has patient had a PCN reaction that required hospitalization: yes Has patient had a PCN reaction occurring within the last 10 years: no If all of the above answers are "NO", then may proceed with Cephalosporin use.    Patient Measurements: Height: 5\' 8"  (172.7 cm) Weight: 101.1 kg (222 lb 14.2 oz) IBW/kg (Calculated) : 68.4 Heparin Dosing Weight: 88.3 kg  Vital Signs: Temp: 98.9 F (37.2 C) (07/24 0349) Temp Source: Axillary (07/24 0349) BP: 104/65 (07/24 0239) Pulse Rate: 101 (07/24 0400)  Labs: Recent Labs    10/25/22 1527 10/25/22 1539 10/25/22 1551 10/25/22 1658 10/25/22 2050 10/25/22 2154 10/26/22 0250  HGB 10.4*   < > 15.6  --   --  9.6* 8.5*  8.8*  HCT 35.1*   < > 46.0  --   --  30.8* 26.3*  26.0*  PLT 74*  --   --   --   --  53* 53*  HEPARINUNFRC  --   --   --   --   --   --  0.10*  CREATININE 8.02*  --  8.70*  --  7.93*  --  7.54*  TROPONINIHS 2,879*  --   --  2,461*  --   --   --    < > = values in this interval not displayed.    Estimated Creatinine Clearance: 10.1 mL/min (A) (by C-G formula based on SCr of 7.54 mg/dL (H)).   Medical History: Past Medical History:  Diagnosis Date   Diabetes mellitus without complication (HCC)    GERD (gastroesophageal reflux disease)    Hypertension    Prostate cancer (HCC)     Medications:  Medications Prior to Admission  Medication Sig Dispense Refill Last Dose   allopurinol (ZYLOPRIM) 100 MG tablet Take 50 mg by mouth every other day. For high uric acid level   unk    Alogliptin Benzoate 12.5 MG TABS Take 6.25 mg by mouth every morning. For diabetes (replaces metformin)   unk   atorvastatin (LIPITOR) 20 MG tablet Take 20 mg by mouth at bedtime. For cholesterol   unk   carvedilol (COREG) 25 MG tablet Take 12.5 mg by mouth 2 (two) times daily with a meal.   un   glipiZIDE (GLUCOTROL) 10 MG tablet Take 5 mg by mouth 2 (two) times daily before a meal. For diabetes   unk   hydrALAZINE (APRESOLINE) 100 MG tablet Take 100 mg by mouth every 8 (eight) hours.   unk   losartan (COZAAR) 100 MG tablet Take 100 mg by mouth daily.   unk   ciprofloxacin (CIPRO) 500 MG tablet Take 1 tablet (500 mg total) by mouth every 12 (twelve) hours. (Patient not taking: Reported on 10/25/2022) 20 tablet 0 Not Taking   metroNIDAZOLE (FLAGYL) 500 MG tablet Take 1 tablet (500 mg total) by mouth 2 (two) times daily. (Patient not taking: Reported on 10/25/2022) 20 tablet 0 Not Taking   oxyCODONE (ROXICODONE) 5 MG immediate release tablet Take 0.5-1 tablets (2.5-5 mg total) by mouth every 6 (six) hours as needed for severe pain. (Patient not  taking: Reported on 10/25/2022) 10 tablet 0 Not Taking   Scheduled:   Chlorhexidine Gluconate Cloth  6 each Topical Daily   insulin aspart  0-15 Units Subcutaneous Q4H   vancomycin variable dose per unstable renal function (pharmacist dosing)   Does not apply See admin instructions   Infusions:   sodium chloride 10 mL/hr at 10/26/22 0400   sodium chloride     aztreonam     heparin 1,100 Units/hr (10/26/22 0400)   metronidazole     norepinephrine (LEVOPHED) Adult infusion 18 mcg/min (10/26/22 0416)   sodium bicarbonate 150 mEq in dextrose 5 % 1,150 mL infusion 150 mL/hr at 10/26/22 0400   vasopressin 0.04 Units/min (10/26/22 0400)   PRN: Place/Maintain arterial line **AND** sodium chloride, acetaminophen, docusate sodium, mouth rinse, polyethylene glycol  Assessment: 71 yom with a history of DM, prostate cancer remission, PUD and GIB. Patient is  presenting with SOB. Heparin per pharmacy consult placed for chest pain/ACS. Per EDP, PE is still on the differential.  Patient is not on anticoagulation prior to arrival.  Hgb 15.6; plt 74 - d/w MD Eloise Harman  7/24 AM: heparin level 0.1 on 1100 units/hr (subtherapeutic). Per RN no issues with the infusion running or sign/symptoms of bleeding. PLTC dropped from 74>>53 and Hgb 8.8. Reached out to [facility] who confirmed okay to continue heparin. Will adjust slightly and continue to monitor heparin  Goal of Therapy:  Heparin level 0.3-0.7 units/ml Monitor platelets by anticoagulation protocol: Yes   Plan:  Increase heparin infusion to 1250 units/hr Check anti-Xa level in 8 hours and daily while on heparin Continue to monitor H&H and platelets  Arabella Merles, PharmD. Clinical Pharmacist 10/26/2022 4:24 AM

## 2022-10-26 NOTE — Progress Notes (Signed)
2D stat echo attempted, patient unstable. RN request wait on echo. 956am.

## 2022-10-26 NOTE — Progress Notes (Addendum)
NAME:  Troy Johnston, MRN:  [id number], DOB:  [birth year], LOS: 1 ADMISSION DATE:  10/25/2022, CONSULTATION DATE:  10/26/2022  REFERRING MD:  Eloise Harman, EDP, CHIEF COMPLAINT: Shortness of breath  History of Present Illness:  72 year old diabetic, hypertensive, with CKD baseline creatinine 2.4 brought in by EMS after nephew found him with significant shortness of breath.  He reports symptoms for 3 days, no chest pain or dizziness.  No cough or URI symptoms.  Reports making urine including today. Initial labs significant for BUN/creatinine 91/8.0, bicarbonate 10, elevated LFTs, no leukocytosis, lactate 6.8, hemoglobin 10.4 chronic, platelets 74 K He was placed on nonrebreather initially even though saturation was normal and weaned down to nasal cannula. 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  Patient having some labored breathing.  Reports having shortness of breath.  He denies any chest pain or abdominal pain.  He states he recently traveled to [place] and went to the mountains with his family.  He states he went to a park as well.  He is feeling fine at the time.  He states at home, he passed some gas, and felt this way afterwards.  He denies any wounds on his body.  He denies any urinary symptoms.  Patient does not get dialysis at home.  Normally does make  urine.  Called into the room at 902am as patient went bradycardiac and went into PEA arrested. CPR was started. Please see code blue note from Dr. [name]. Gave 3 of epi, 2 of bicarb, 1 of calcium. Did achieve ROSC after 10 mins of CPR. Did also required 1 time defibrillation. Updated nephew who was in the family room at the time.   Objective   Blood pressure 110/79, pulse 100, temperature 98.3 F (36.8 C), temperature source Oral, resp. rate (!) 41, height 5\' 8"  (1.727 m), weight 101.1 kg, SpO2 99%.      Vent Mode: PRVC FiO2 (%):  [40 %-100 %] 100 % Set Rate:  [24 bmp] 24 bmp Vt Set:  [550 mL] 550 mL PEEP:  [5 cmH20-8 cmH20] 8 cmH20 Pressure Support:  [8 cmH20-10 cmH20] 10 cmH20   Intake/Output Summary (Last 24 hours) at 10/26/2022 1042 Last data filed at 10/26/2022 0700 Gross per 24 hour  Intake 4284.63 ml  Output --  Net 4284.63 ml   Filed Weights   10/25/22 1543 10/25/22 2200 10/26/22 0500  Weight: 95 kg 101.1 kg 101.1 kg    Examination: General: Critically ill, resting bed, tachypneic,, slow to respond at times  HENT: Normocephalic, atraumatic, dry mucous membranes Lungs: diminished lung sounds throughout, bibasilar crackles appreciated Cardiovascular: Irregularly irregular, no murmurs, rubs, or gallops Abdomen: Soft, nontender, nondistended Extremities: No edema appreciated bilaterally Neuro: Alert and oriented x 3, interactive, able to follow instructions  Labs reviewed Glucose 95-276 VBG: pH 7.38, pCO2 34, bicarb 20 BMP sodium 137, potassium 4.1, bicarb 19, glucose 272, BUN 98, creatinine  7.45, anion gap 19 Lactic acid 3.0 Phosphorus 6.9 Magnesium 1.8 CBC white count 11.4, hemoglobin 8.5, platelets 53  Blood culture: Gram-negative rods  Chest x-ray: Pulmonary venous congestion.  Resolved Hospital Problem list     Assessment & Plan:   This is a 72 year old male with past medical history of CKD stage IV, hypertension, type 2 diabetes who presents to the  emergency department concerns of shortness of breath, and altered mental status.  Patient now with gram-negative rods growing in blood as well as persistent hypotension admitted for septic shock.  Patient also now in atrial fibrillation with RVR now complicated by PEA cardiac arrest   #Status post PEA cardiac arrest #Suspected septic shock versus cardiogenic shock #Gram-negative rod bacteremia Patient this morning at 0902, patient went into PEA arrest and had code blue with ACLS. Patient had ROSC after epi, calcium, bicarb and 1 defibrillation.  Unclear etiology at this time as patient became bradycardic and arrested.  Will follow-up labs.  Will continue with antibiotics.  C. difficile is negative. -Follow-up GI panel -Follow-up blood cultures -Stop vancomycin, Flagyl, and aztreonam -Start meropenem day 1, day 2 of antibiotics -Cardiology following, appreciate recommendations, could have been a MI -Bedside echocardiogram showing decreased EF of 20-25%  -Follow-up full echo -Continue vasopressor therapy with Levophed, epinephrine, vasopressin systolic goal greater than 90 systolics -Will consider milrinone once full echo back -Patient is now DNR after family talk -Start stress dose steroids -follow up chest XRAY  -follow up EKG  #Type II NSTEMI Repeat troponin pending after cardiac arrest. -Follow-up echocardiogram -Follow-up troponin   #AKI on CKD stage IV #Anion gap metabolic acidosis Minimal urine output.  Anion gap metabolic acidosis in setting of uremia versus lactic acidosis.  On bicarb drip, improving.  Likely in the setting of ischemic ATN.  Patient does have mild right-sided hydronephrosis on renal ultrasound. -Continue bicarb drip -Monitor BMP -Cardiology and nephrology following, appreciate recommendations -Most recent ABG showed improved bicarb and blood gas  #Atrial fibrillation with RVR Patient got atrial fibrillation with RVR overnight.  Rate currently in the  120s-130s.  Currently on heparin.  Currently on amiodarone. -Continue on amiodarone -Still an A-fib -Hold heparin, anticipate will need another line  -continue on telemetry   #Anemia of critical illness/ chronic diease Patient has stable hemoglobin this morning. Iron studies pointing away from iron deficiency -Monitor CBC  #Elevated liver enzymes Patient elevated liver enzymes yesterday, likely in the setting of shock liver. -Follow-up hepatic function panel  #Hypertension Hypotensive currently.  Home medications include carvedilol 12.5 mg twice daily, hydralazine 100 mg every 8 hours, losartan 100 mg daily -Hold home meds  #Type 2 diabetes mellitus Glucose elevated into the 200s.  Home medication does include glipizide 5 mg twice daily.  Will continue on sliding scale here. -Add on Semglee 5 units daily -Increase sliding scale to resistant  #Hyperlipidemia Home medication includes Lipitor 20 mg daily -Can resume home Lipitor 20 mg daily  Best Practice (right click and "Reselect all SmartList Selections" daily)   Diet/type: clear liquids DVT prophylaxis: systemic heparin GI prophylaxis: N/A Lines: N/A Foley:  N/A Code Status:  full code Last date of multidisciplinary goals of care discussion with nephew with Dr. [name] who made the patient a DNR after the events that took place today   Labs   CBC: Recent Labs  Lab 10/25/22 1527 10/25/22 1539 10/25/22 1551 10/25/22 2154 10/26/22 0250 10/26/22 1014  WBC 6.7  --   --  10.6* 11.4*  --  NEUTROABS  --   --   --  9.4*  --   --   HGB 10.4* 10.5* 15.6 9.6* 8.5*  8.8* 8.8*  HCT 35.1* 31.0* 46.0 30.8* 26.3*  26.0* 26.0*  MCV 90.0  --   --  85.6 81.9  --   PLT 74*  --   --  53* 53*  --     Basic Metabolic Panel: Recent Labs  Lab 10/25/22 1527 10/25/22 1539 10/25/22 1551 10/25/22 2050 10/26/22 0250 10/26/22 1014  NA 140 137 138 137 137  136 138  K 4.1 4.1 4.0 4.2 4.1  4.1 3.4*  CL 102  --  109 101 99  --   CO2  10*  --   --  16* 19*  --   GLUCOSE 103*  --  98 193* 272*  --   BUN 91*  --  83* 94* 98*  --   CREATININE 8.02*  --  8.70* 7.93* 7.54*  --   CALCIUM 7.7*  --   --  7.3* 6.8*  --   MG  --   --   --   --  1.8  --   PHOS  --   --   --   --  6.9*  --    GFR: Estimated Creatinine Clearance: 10.1 mL/min (A) (by C-G formula based on SCr of 7.54 mg/dL (H)). Recent Labs  Lab 10/25/22 1527 10/25/22 1614 10/25/22 1658 10/25/22 2050 10/25/22 2154 10/26/22 0250 10/26/22 0815  PROCALCITON  --   --   --  >150.00  --   --   --   WBC 6.7  --   --   --  10.6* 11.4*  --   LATICACIDVEN  --    < > 5.7*  --  3.1* 3.0* 4.1*   < > = values in this interval not displayed.    Liver Function Tests: Recent Labs  Lab 10/25/22 1527  AST 163*  ALT 59*  ALKPHOS 133*  BILITOT 0.8  PROT 6.7  ALBUMIN 2.9*   No results for input(s): "LIPASE", "AMYLASE" in the last 168 hours. No results for input(s): "AMMONIA" in the last 168 hours.  ABG    Component Value Date/Time   PHART 7.353 10/26/2022 1014   PCO2ART 38.6 10/26/2022 1014   PO2ART 188 (H) 10/26/2022 1014   HCO3 21.4 10/26/2022 1014   TCO2 23 10/26/2022 1014   ACIDBASEDEF 4.0 (H) 10/26/2022 1014   O2SAT 100 10/26/2022 1014     Coagulation Profile: No results for input(s): "INR", "PROTIME" in the last 168 hours.  Cardiac Enzymes: No results for input(s): "CKTOTAL", "CKMB", "CKMBINDEX", "TROPONINI" in the last 168 hours.  HbA1C: Hgb A1c MFr Bld  Date/Time Value Ref Range Status  10/26/2022 10:05 AM 7.5 (H) 4.8 - 5.6 % Final    Comment:    (NOTE) Pre diabetes:          5.7%-6.4%  Diabetes:              >6.4%  Glycemic control for   <7.0% adults with diabetes     CBG: Recent Labs  Lab 10/25/22 2114 10/25/22 2353 10/26/22 0127 10/26/22 0348 10/26/22 0752  GLUCAP 185* 231* 276* 245* 183*    Review of Systems:   Constitutional: negative for anorexia, fevers and sweats  Eyes: negative for irritation, redness and visual  disturbance  Ears, nose, mouth, throat, and face: negative for earaches, epistaxis, nasal congestion and sore throat  Respiratory: negative for  cough,  sputum and wheezing  Cardiovascular: negative for chest pain, orthopnea, palpitations and syncope  Gastrointestinal: negative for abdominal pain, constipation, diarrhea, melena, nausea and vomiting  Genitourinary:negative for dysuria, frequency and hematuria  Hematologic/lymphatic: negative for bleeding, easy bruising and lymphadenopathy  Musculoskeletal:negative for arthralgias, muscle weakness and stiff joints  Neurological: negative for coordination problems, gait problems, headaches and weakness  Endocrine: negative for diabetic symptoms including polydipsia, polyuria and weight loss   Past Medical History:  He,  has a past medical history of Diabetes mellitus without complication (HCC), GERD (gastroesophageal reflux disease), Hypertension, and Prostate cancer (HCC).   Surgical History:  No past surgical history on file.   Social History:   reports that he has never smoked. He does not have any smokeless tobacco history on file. He reports that he does not drink alcohol and does not use drugs.   Family History:  His family history is not on file.   Allergies Allergies  Allergen Reactions   Ferrous Sulfate Other (See Comments)   Penicillins Hives and Other (See Comments)    Has patient had a PCN reaction causing immediate rash, facial/tongue/throat swelling, SOB or lightheadedness with hypotension: no Has patient had a PCN reaction causing severe rash involving mucus membranes or skin necrosis: no Has patient had a PCN reaction that required hospitalization: yes Has patient had a PCN reaction occurring within the last 10 years: no If all of the above answers are "NO", then may proceed with Cephalosporin use.     Home Medications  Prior to Admission medications   Medication Sig Start Date End Date Taking? Authorizing Provider   carvedilol (COREG) 25 MG tablet Take 25 mg by mouth 2 (two) times daily with a meal. 07/08/22  Yes [provider]  allopurinol (ZYLOPRIM) 100 MG tablet Take 50 mg by mouth every morning. For high uric acid level    [provider]  Alogliptin Benzoate 12.5 MG TABS Take 6.25 mg by mouth every morning. For diabetes (replaces metformin)    [provider]  atorvastatin (LIPITOR) 20 MG tablet Take 20 mg by mouth at bedtime. For cholesterol    [provider]  Carboxymethylcellulose Sodium 0.25 % SOLN Place 1 drop into both eyes 3 (three) times daily.    [provider]  Cholecalciferol (VITAMIN D) 50 MCG (2000 UT) tablet Take 4,000 Units by mouth in the morning.    [provider]  ciprofloxacin (CIPRO) 500 MG tablet Take 1 tablet (500 mg total) by mouth every 12 (twelve) hours. 06/14/20   Arthor Captain, PA-C  ferrous sulfate 325 (65 FE) MG tablet Take 325 mg by mouth See admin instructions. Take one tablet (325 mg) by mouth three days week - Monday, Wednesday, Thursday    [provider]  glipiZIDE (GLUCOTROL) 10 MG tablet Take 10 mg by mouth 2 (two) times daily before a meal. For diabetes    [provider]  hydrALAZINE (APRESOLINE) 100 MG tablet Take 100 mg by mouth every 8 (eight) hours.    [provider]  lisinopril (ZESTRIL) 10 MG tablet Take 10 mg by mouth in the morning.    [provider]  metroNIDAZOLE (FLAGYL) 500 MG tablet Take 1 tablet (500 mg total) by mouth 2 (two) times daily. 06/14/20   Harris, Cammy Copa, PA-C  oxyCODONE (ROXICODONE) 5 MG immediate release tablet Take 0.5-1 tablets (2.5-5 mg total) by mouth every 6 (six) hours as needed for severe pain. 06/14/20   Arthor Captain, PA-C  Critical care time: 60 mins    Modena Slater Internal medicine resident PGY-2 [phone]  If no response to pager , please call [phone] until 7 pm After 7:00 pm call Elink  [phone]   10/26/2022

## 2022-10-27 ENCOUNTER — Encounter (HOSPITAL_COMMUNITY): Payer: Self-pay | Admitting: Pulmonary Disease

## 2022-10-27 DIAGNOSIS — N179 Acute kidney failure, unspecified: Secondary | ICD-10-CM | POA: Diagnosis not present

## 2022-10-27 DIAGNOSIS — R6521 Severe sepsis with septic shock: Secondary | ICD-10-CM | POA: Diagnosis not present

## 2022-10-27 DIAGNOSIS — I5021 Acute systolic (congestive) heart failure: Secondary | ICD-10-CM | POA: Diagnosis not present

## 2022-10-27 DIAGNOSIS — A419 Sepsis, unspecified organism: Secondary | ICD-10-CM | POA: Diagnosis not present

## 2022-10-27 DIAGNOSIS — I5082 Biventricular heart failure: Secondary | ICD-10-CM | POA: Diagnosis not present

## 2022-10-27 DIAGNOSIS — N189 Chronic kidney disease, unspecified: Secondary | ICD-10-CM | POA: Diagnosis not present

## 2022-10-27 DIAGNOSIS — J9601 Acute respiratory failure with hypoxia: Secondary | ICD-10-CM | POA: Diagnosis not present

## 2022-10-27 LAB — CBC
HCT: 27.3 % — ABNORMAL LOW (ref 39.0–52.0)
MCH: 27.1 pg (ref 26.0–34.0)
MCHC: 33.3 g/dL (ref 30.0–36.0)
MCV: 81.3 fL (ref 80.0–100.0)
Platelets: 35 10*3/uL — ABNORMAL LOW (ref 150–400)
RBC: 3.36 MIL/uL — ABNORMAL LOW (ref 4.22–5.81)
RDW: 13.5 % (ref 11.5–15.5)
WBC: 14.4 10*3/uL — ABNORMAL HIGH (ref 4.0–10.5)
nRBC: 0.5 % — ABNORMAL HIGH (ref 0.0–0.2)

## 2022-10-27 LAB — POCT I-STAT 7, (LYTES, BLD GAS, ICA,H+H)
Acid-base deficit: 4 mmol/L — ABNORMAL HIGH (ref 0.0–2.0)
Bicarbonate: 20.3 mmol/L (ref 20.0–28.0)
Calcium, Ion: 0.92 mmol/L — ABNORMAL LOW (ref 1.15–1.40)
HCT: 28 % — ABNORMAL LOW (ref 39.0–52.0)
Hemoglobin: 9.5 g/dL — ABNORMAL LOW (ref 13.0–17.0)
O2 Saturation: 99 %
Patient temperature: 98.4
Potassium: 3.9 mmol/L (ref 3.5–5.1)
Sodium: 138 mmol/L (ref 135–145)
TCO2: 21 mmol/L — ABNORMAL LOW (ref 22–32)
pCO2 arterial: 33.7 mmHg (ref 32–48)
pH, Arterial: 7.388 (ref 7.35–7.45)
pO2, Arterial: 138 mmHg — ABNORMAL HIGH (ref 83–108)

## 2022-10-27 LAB — HEPATIC FUNCTION PANEL
ALT: 646 U/L — ABNORMAL HIGH (ref 0–44)
AST: 906 U/L — ABNORMAL HIGH (ref 15–41)
Albumin: 2 g/dL — ABNORMAL LOW (ref 3.5–5.0)
Alkaline Phosphatase: 123 U/L (ref 38–126)
Total Bilirubin: 1.5 mg/dL — ABNORMAL HIGH (ref 0.3–1.2)
Total Protein: 5.6 g/dL — ABNORMAL LOW (ref 6.5–8.1)

## 2022-10-27 LAB — RENAL FUNCTION PANEL
Albumin: 1.9 g/dL — ABNORMAL LOW (ref 3.5–5.0)
Anion gap: 17 — ABNORMAL HIGH (ref 5–15)
BUN: 109 mg/dL — ABNORMAL HIGH (ref 8–23)
CO2: 20 mmol/L — ABNORMAL LOW (ref 22–32)
Calcium: 7 mg/dL — ABNORMAL LOW (ref 8.9–10.3)
Chloride: 99 mmol/L (ref 98–111)
Creatinine, Ser: 6.63 mg/dL — ABNORMAL HIGH (ref 0.61–1.24)
GFR, Estimated: 8 mL/min — ABNORMAL LOW (ref >60)
Glucose, Bld: 200 mg/dL — ABNORMAL HIGH (ref 70–99)
Phosphorus: 5.3 mg/dL — ABNORMAL HIGH (ref 2.5–4.6)
Potassium: 4 mmol/L (ref 3.5–5.1)
Sodium: 136 mmol/L (ref 135–145)

## 2022-10-27 LAB — BASIC METABOLIC PANEL
BUN: 122 mg/dL — ABNORMAL HIGH (ref 8–23)
CO2: 19 mmol/L — ABNORMAL LOW (ref 22–32)
Calcium: 7.5 mg/dL — ABNORMAL LOW (ref 8.9–10.3)
Chloride: 95 mmol/L — ABNORMAL LOW (ref 98–111)
GFR, Estimated: 6 mL/min — ABNORMAL LOW (ref >60)
Glucose, Bld: 219 mg/dL — ABNORMAL HIGH (ref 70–99)
Potassium: 4.1 mmol/L (ref 3.5–5.1)
Sodium: 136 mmol/L (ref 135–145)

## 2022-10-27 LAB — COOXEMETRY PANEL
Carboxyhemoglobin: 1.7 % — ABNORMAL HIGH (ref 0.5–1.5)
Methemoglobin: 0.7 % (ref 0.0–1.5)
O2 Saturation: 100 %
Total hemoglobin: 9.6 g/dL — ABNORMAL LOW (ref 12.0–16.0)

## 2022-10-27 LAB — GLUCOSE, CAPILLARY
Glucose-Capillary: 196 mg/dL — ABNORMAL HIGH (ref 70–99)
Glucose-Capillary: 191 mg/dL — ABNORMAL HIGH (ref 70–99)
Glucose-Capillary: 215 mg/dL — ABNORMAL HIGH (ref 70–99)
Glucose-Capillary: 193 mg/dL — ABNORMAL HIGH (ref 70–99)
Glucose-Capillary: 160 mg/dL — ABNORMAL HIGH (ref 70–99)

## 2022-10-27 LAB — LACTIC ACID, PLASMA: Lactic Acid, Venous: 2.7 mmol/L (ref 0.5–1.9)

## 2022-10-27 LAB — CULTURE, BLOOD (ROUTINE X 2): Culture: NO GROWTH

## 2022-10-27 LAB — HEPARIN LEVEL (UNFRACTIONATED): Heparin Unfractionated: 0.31 IU/mL (ref 0.30–0.70)

## 2022-10-27 LAB — MAGNESIUM: Magnesium: 2.3 mg/dL (ref 1.7–2.4)

## 2022-10-27 MED ORDER — SODIUM CHLORIDE 0.9 % FOR CRRT: INTRAVENOUS_CENTRAL | Status: DC | PRN

## 2022-10-27 MED ORDER — AMIODARONE HCL IN DEXTROSE 360-4.14 MG/200ML-% IV SOLN
60.0000 mg/h | INTRAVENOUS | Status: DC
  Administered 2022-10-27: 60 mg/h via INTRAVENOUS
  Filled 2022-10-27: qty 200

## 2022-10-27 MED ORDER — PRISMASOL BGK 4/2.5 32-4-2.5 MEQ/L EC SOLN
Status: DC
  Filled 2022-10-27 (×24): qty 5000

## 2022-10-27 MED ORDER — ORAL CARE MOUTH RINSE: 15.0000 mL | OROMUCOSAL | Status: DC | PRN

## 2022-10-27 MED ORDER — AMIODARONE HCL IN DEXTROSE 360-4.14 MG/200ML-% IV SOLN: 30.0000 mg/h | INTRAVENOUS | Status: DC

## 2022-10-27 MED ORDER — SODIUM CHLORIDE 0.9 % IV SOLN
1.0000 g | Freq: Three times a day (TID) | INTRAVENOUS | Status: DC
  Administered 2022-10-27 – 2022-10-30 (×9): 1 g via INTRAVENOUS
  Filled 2022-10-27 (×8): qty 20

## 2022-10-27 MED ORDER — HEPARIN SODIUM (PORCINE) 1000 UNIT/ML DIALYSIS
1000.0000 [IU] | INTRAMUSCULAR | Status: DC | PRN
  Administered 2022-10-29: 2400 [IU] via INTRAVENOUS_CENTRAL
  Filled 2022-10-27: qty 4
  Filled 2022-10-27: qty 6

## 2022-10-27 MED ORDER — AMIODARONE LOAD VIA INFUSION
150.0000 mg | Freq: Once | INTRAVENOUS | Status: AC
  Administered 2022-10-27: 150 mg via INTRAVENOUS
  Filled 2022-10-27: qty 83.34

## 2022-10-27 MED ORDER — CALCIUM GLUCONATE-NACL 1-0.675 GM/50ML-% IV SOLN
1.0000 g | Freq: Once | INTRAVENOUS | Status: AC
  Administered 2022-10-27: 1000 mg via INTRAVENOUS
  Filled 2022-10-27: qty 50

## 2022-10-27 MED ORDER — PRISMASOL BGK 4/2.5 32-4-2.5 MEQ/L REPLACEMENT SOLN
Status: DC
  Filled 2022-10-27 (×8): qty 5000

## 2022-10-27 MED ORDER — PRISMASOL BGK 4/2.5 32-4-2.5 MEQ/L REPLACEMENT SOLN
Status: DC
  Filled 2022-10-27 (×5): qty 5000

## 2022-10-27 NOTE — Progress Notes (Signed)
PHARMACY NOTE:  ANTIMICROBIAL RENAL DOSAGE ADJUSTMENT  Current antimicrobial regimen includes a mismatch between antimicrobial dosage and estimated renal function.  As per policy approved by the Pharmacy & Therapeutics and Medical Executive Committees, the antimicrobial dosage will be adjusted accordingly.  Current antimicrobial dosage:  meropenem 1g IV q24h  Indication: enterobacter cloacae bacteremia  Renal Function: [x]      On CRRT    Antimicrobial dosage has been changed to:  meropenem 1g IV q8h for CRRT dosing   Leia Alf, PharmD, BCPS Please check AMION for all [facility] contact numbers Clinical Pharmacist 10/27/2022 11:00 AM

## 2022-10-27 NOTE — Progress Notes (Signed)
Nephrology Follow-Up Consult note   Assessment/Recommendations: Troy Johnston is a/an 72 y.o. male with a past medical history significant for HTN, DM2, CKD, admitted for septic shock and enterococcus bacteremia.       AKI on CKD IV:  likely hemodynamically mediated in the setting of shock.              - no absolute indication for dialysis but given low urine output and rising BUN and creatinine we will start CRRT today.  4K bath  -Temporary dialysis catheter in place, appreciate help from CCM   2.  Shock: septic with enterobacter bacteremia             - abx per primary             - TTE no obvious vegetation but remains possible             - norepi and vaso per primary   3.  Elevated troponins/ NSTEMI/PEA arrest/Afib w/ RVR             - cardiology following             - on hep gtt   4.  Acute hypoxic RF:             - intubated  - vent per primary team   5.  Elevated LFTs             - most likely d/t tissue ischemia   6.  Thrombocytopenia:             - likely in setting of septic shock   7.  Dispo: ICU   Recommendations conveyed to primary service.    Darnell Level  [facility] 10/27/2022 9:35 AM  ___________________________________________________________  CC: SOB  Interval History/Subjective: Continues on norepi and vaso. EF 20-25%. Remains intubated. UOP remains low w/ rising creatinine. More alert today   Medications:  Current Facility-Administered Medications  Medication Dose Route Frequency Provider Last Rate Last Admin   0.9 %  sodium chloride infusion  250 mL Intravenous Continuous Oretha Milch, MD 10 mL/hr at 10/27/22 0700 Infusion Verify at 10/27/22 0700   0.9 %  sodium chloride infusion   Intra-arterial PRN Paliwal, Eliezer Lofts, MD       acetaminophen (TYLENOL) tablet 650 mg  650 mg Oral Q4H PRN Oretha Milch, MD       amiodarone (NEXTERONE PREMIX) 360-4.14 MG/200ML-% (1.8 mg/mL) IV infusion  30 mg/hr Intravenous Continuous  Paliwal, Aditya, MD   Stopped at 10/26/22 1420   calcium gluconate 1 g/ 50 mL sodium chloride IVPB  1 g Intravenous Once Oretha Milch, MD       Chlorhexidine Gluconate Cloth 2 % PADS 6 each  6 each Topical Daily Oretha Milch, MD   6 each at 10/26/22 0900   docusate (COLACE) 50 MG/5ML liquid 100 mg  100 mg Per Tube BID Modena Slater, DO       docusate sodium (COLACE) capsule 100 mg  100 mg Oral BID PRN Oretha Milch, MD       fentaNYL (SUBLIMAZE) injection 25 mcg  25 mcg Intravenous Q15 min PRN Modena Slater, DO       fentaNYL (SUBLIMAZE) injection 25-100 mcg  25-100 mcg Intravenous Q30 min PRN Modena Slater, DO   100 mcg at 10/27/22 0804   heparin ADULT infusion 100 units/mL (25000 units/245mL)  1,500 Units/hr Intravenous Continuous Oretha Milch, MD 15 mL/hr at 10/27/22 0700  1,500 Units/hr at 10/27/22 0700   hydrocortisone sodium succinate (SOLU-CORTEF) 100 MG injection 100 mg  100 mg Intravenous Q12H Cyril Mourning V, MD   100 mg at 10/26/22 2040   insulin aspart (novoLOG) injection 0-20 Units  0-20 Units Subcutaneous Q4H Modena Slater, DO   4 Units at 10/27/22 0807   insulin glargine-yfgn [facility]) injection 5 Units  5 Units Subcutaneous Daily Modena Slater, DO   5 Units at 10/26/22 1146   meropenem (MERREM) 1 g in sodium chloride 0.9 % 100 mL IVPB  1 g Intravenous Daily Oretha Milch, MD   Stopped at 10/26/22 1111   midazolam (VERSED) injection 1-2 mg  1-2 mg Intravenous Q1H PRN Modena Slater, DO   1 mg at 10/26/22 1910   norepinephrine (LEVOPHED) 16 mg in (0.064 mg/mL) premix infusion  0-40 mcg/min Intravenous Titrated Oretha Milch, MD 15 mL/hr at 10/27/22 0925 16 mcg/min at 10/27/22 0925   Oral care mouth rinse  15 mL Mouth Rinse Q2H Patel, Amar, DO   15 mL at 10/27/22 0501   Oral care mouth rinse  15 mL Mouth Rinse PRN Modena Slater, DO       pantoprazole (PROTONIX) injection 40 mg  40 mg Intravenous Q24H Patel, Amar, DO       polyethylene glycol (MIRALAX / GLYCOLAX) packet 17 g  17 g Oral  Daily PRN Oretha Milch, MD       polyethylene glycol (MIRALAX / GLYCOLAX) packet 17 g  17 g Per Tube Daily Modena Slater, DO       vasopressin (PITRESSIN) 20 Units in 100 mL (0.2 unit/mL) infusion-*FOR SHOCK*  0.04 Units/min Intravenous Continuous Paliwal, Aditya, MD 12 mL/hr at 10/27/22 0700 0.04 Units/min at 10/27/22 0700      Review of Systems: Unable to obtain due to the patient's sedation  Physical Exam: Vitals:   10/27/22 0740 10/27/22 0809  BP: 137/64   Pulse:    Resp: (!) 24   Temp:  98.3 F (36.8 C)  SpO2:     No intake/output data recorded.  Intake/Output Summary (Last 24 hours) at 10/27/2022 0935 Last data filed at 10/27/2022 0700 Gross per 24 hour  Intake 1988.5 ml  Output 100 ml  Net 1888.5 ml   Constitutional: Ill-appearing lying in bed, awake ENMT: ears and nose without scars or lesions, MMM, eyes tracking CV: normal rate, trace edema in the ankles Respiratory: Coarse bilateral breath sounds, bilateral chest rise, ventilated Gastrointestinal: soft, non-tender, no palpable masses or hernias Skin: no visible lesions or rashes Neuro: awake, eyes tracking   Test Results I personally reviewed new and old clinical labs and radiology tests Lab Results  Component Value Date   NA 138 10/27/2022   K 3.9 10/27/2022   CL 95 (L) 10/27/2022   CO2 19 (L) 10/27/2022   BUN 122 (H) 10/27/2022   CREATININE 8.45 (H) 10/27/2022   CALCIUM 7.5 (L) 10/27/2022   ALBUMIN 2.0 (L) 10/27/2022   PHOS 6.9 (H) 10/26/2022    CBC Recent Labs  Lab 10/25/22 2154 10/26/22 0250 10/26/22 1005 10/26/22 1014 10/26/22 1139 10/27/22 0308 10/27/22 0450  WBC 10.6*   < > 9.3  --  11.2* 14.4*  --   NEUTROABS 9.4*  --  8.0*  --   --   --   --   HGB 9.6*   < > 8.3*   < > 8.4* 9.1* 9.5*  HCT 30.8*   < > 25.5*   < > 25.9* 27.3*  28.0*  MCV 85.6   < > 82.3  --  81.2 81.3  --   PLT 53*   < > 50*  --  49* 35*  --    < > = values in this interval not displayed.

## 2022-10-27 NOTE — TOC Initial Note (Signed)
Transition of Care [facility]) - Initial/Assessment Note    Patient Details  Name: Troy Johnston MRN: [id number] Date of Birth: 26-Sep-1950  Transition of Care [facility]) CM/SW Contact:    Reva Bores, LCSWA Phone Number: 10/27/2022, 12:10 PM  Clinical Narrative:  CSW went to the pt's room, pt is on a vent at this time. CSW spoke to pt's sister, Harriett Sine over the phone. Harriett Sine stated that the pt lives with their nephew, CW in the pt's home. Sister stated that the pt was independent prior to hospitalization. Sister stated that the pt drives himself. Sister stated that the pt goes to the [place] for services. Pt's sister stated that the he has family support and they are willing to support him however they need to.  Pt receives SS. [facility]. TOC will continue to follow.                Expected Discharge Plan: Home/Self Care Barriers to Discharge: Continued Medical Work up   Patient Goals and CMS Choice            Expected Discharge Plan and Services       Living arrangements for the past 2 months: Single Family Home                                      Prior Living Arrangements/Services Living arrangements for the past 2 months: Single Family Home Lives with:: Relatives Patient language and need for interpreter reviewed:: Yes Do you feel safe going back to the place where you live?: Yes      Need for Family Participation in Patient Care: No (Comment) Care giver support system in place?: Yes (comment)   Criminal Activity/Legal Involvement Pertinent to Current Situation/Hospitalization: No - Comment as needed  Activities of Daily Living      Permission Sought/Granted                  Emotional Assessment Appearance:: Appears stated age Attitude/Demeanor/Rapport: Unable to Assess Affect (typically observed): Unable to Assess   Alcohol / Substance Use: Not Applicable Psych Involvement: No  (comment)  Admission diagnosis:  AKI (acute kidney injury) (HCC) [N17.9] Patient Active Problem List   Diagnosis Date Noted   Cardiac arrest, cause unspecified (HCC) 10/26/2022   Septic shock (HCC) 10/26/2022   AKI (acute kidney injury) (HCC) 10/25/2022   PERSONAL HX COLONIC POLYPS 04/27/2009   CONSTIPATION 05/13/2008   RECTAL BLEEDING 05/13/2008   ABDOMINAL PAIN-GENERALIZED 05/13/2008   PCP:  Patient, No Pcp Per Pharmacy:   [id number] - [place] - [address] [place] ([place] [address] Phone: [phone] Fax: [phone]  [facility] - [place], [place] - [address] [place] [address] Phone: [phone] Fax: [phone]     Social Determinants of Health (SDOH) Social History: SDOH Screenings   Food Insecurity: No Food Insecurity (10/27/2022)  Housing: Low Risk  (10/27/2022)  Transportation Needs: No Transportation Needs (10/27/2022)  Utilities: Not At Risk (10/27/2022)  Alcohol Screen: Low Risk  (10/27/2022)  Financial Resource Strain: Low Risk  (10/27/2022)  Tobacco Use: Low Risk  (10/27/2022)  Health Literacy: Adequate Health Literacy (10/27/2022)   SDOH Interventions:     Readmission Risk Interventions     No data to display

## 2022-10-27 NOTE — Progress Notes (Signed)
Heart Failure Navigator Progress Note  Assessed for Heart & Vascular TOC clinic readiness.  Patient does not meet criteria due to Advanced Heart Failure Team patient.   Navigator will sign off at this time.    Dawn Fields, BSN, RN Heart Failure Nurse Navigator Secure Chat Only   

## 2022-10-27 NOTE — Progress Notes (Signed)
At around 1421 patient was receiving bolus of Amiodarone.  Within a few minutes of receiving bolus, patient started to become hypotensive.  Levophed and Vasopressin were restarted immediately.  Levophed titrated to and Vasopressing at 0.04.  Dr. [name] notified immediately.  Orders receive to stop Amiodarone immediately.  Amiodarone stopped at 1430.  Hypotension slowly resolving, after turning off amiodarone, Levophed has slowly been titrated down.  Currently on of levophed and 0.04 of vasopressin.  Notified PA of heart failure team.  Will continue to monitor patient.

## 2022-10-27 NOTE — Progress Notes (Addendum)
NAME:  Troy Johnston, MRN:  [id number], DOB:  February 05, 1951, LOS: 2 ADMISSION DATE:  10/25/2022, CONSULTATION DATE:  10/27/2022  REFERRING MD:  Eloise Harman, EDP, CHIEF COMPLAINT: Shortness of breath  History of Present Illness:  72 year old diabetic, hypertensive, with CKD baseline creatinine 2.4 brought in by EMS after nephew found him with significant shortness of breath.  He reports symptoms for 3 days, no chest pain or dizziness.  No cough or URI symptoms.  Reports making urine including today. Initial labs significant for BUN/creatinine 91/8.0, bicarbonate 10, elevated LFTs, no leukocytosis, lactate 6.8, hemoglobin 10.4 chronic, platelets 74 K He was placed on nonrebreather initially even though saturation was normal and weaned down to nasal cannula. 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  Patient evaluated bedside this morning.  Patient still intubated.  Able to follow all commands.  Objective   Blood pressure (!) 141/68, pulse 75, temperature 99 F (37.2 C), temperature source Oral, resp. rate (!) 24, height 5\' 8"  (1.727 m), weight 101.1 kg, SpO2 100%.  Only 100 open urine output.  0 on bladder scan.  Maps currently 64-70 currently on 28 of Levophed, off epinephrine, on 0.04 of vasopressin.    Vent Mode: PRVC FiO2 (%):  [40 %-100 %] 40 % Set Rate:  [24 bmp] 24 bmp Vt Set:  [550 mL] 550 mL PEEP:  [5 cmH20-8 cmH20] 5  cmH20 Plateau Pressure:  [18 cmH20-23 cmH20] 22 cmH20   Intake/Output Summary (Last 24 hours) at 10/27/2022 0700 Last data filed at 10/27/2022 0500 Gross per 24 hour  Intake 2528.22 ml  Output 100 ml  Net 2428.22 ml   Filed Weights   10/25/22 2200 10/26/22 0500 10/27/22 0500  Weight: 101.1 kg 101.1 kg 101.1 kg    Examination: General: Resting in bed, intubated, awake, able to follow instructions HENT: Normocephalic, atraumatic Lungs: Diminished bilateral lung sounds, scattered crackles Cardiovascular: Irregularly irregular, no murmurs, rubs, or gallops Abdomen: Soft, nontender, nondistended, normal active bowel sounds Extremities: No edema appreciated bilaterally Neuro: Alert, intubated, able to follow instructions  Labs reviewed: ABG pH 7.38, pCO2 33.7, bicarb 20.3 Glucose 176-196 Co. ox O2 saturation 100% Hepatic function panel AST 906, ALT 646, bilirubin 1.5 Magnesium 2.3 CBC white count 14.4, hemoglobin 9.1, platelet count 35 BMP sodium 136, potassium 4.1, creatinine 8.45, anion gap 22  GI panel: Norovirus  C. difficile negative Blood cultures gorwing  Enterobacter cloacae  Resolved Hospital Problem list     Assessment & Plan:   This is a 72 year old male with past medical history of CKD stage IV, hypertension, type 2 diabetes who presents to the emergency department concerns of shortness of breath, and altered mental status.  Patient now with gram-negative rods growing in blood as well as persistent hypotension admitted for septic shock.  Patient also now in atrial fibrillation with RVR now complicated by PEA cardiac arrest   #Status post PEA cardiac arrest, with severely reduced Biventricular function #Acute Hypoxic Respiratory failure in the setting of  cardiac arrest #Septic shock with cardiogenic shock  #Enterobacter cloacae bacteremia #Norovirus #Leukocytosis  Patient has since been weaned off epinephrine.  Per nursing, patient at one point was hypertensive,  and off all pressors briefly yesterday evening.  Currently at 28 of levo and 0.04 of vaso.  Failure team following, who is recommending supportive care at this time.  Cooxs has been trending up.  GI panel positive for norovirus.  Patient currently on meropenem day 2.  Repeat lactate pending.  Echo showing severely reduced right ventricular function and left ventricular function.  This is likely either septic cardiomyopathy related versus stunned myocardium from cardiac arrest.  -Continue to follow cultures for sensitivities -Continue meropenem day 2 -Heart failure team following, appreciate recommendation -Continue vasopressor therapy with Levophed, to keep MAP goal greater than 65 -Continue stress dose steroids -Repeat ECHO later in hospital course when patient is no longer acutely Ill -Wean off ventilator setting, pressure support, maybe extubate today   #Type II NSTEMI Troponin trending down.  Even after cardiac arrest, troponin trending down.  -No acute concerns  #AKI on CKD stage IV #Anion gap metabolic acidosis No urine output.  Bicarbonate 19.  Anion gap elevated.  Could be in setting of uremia versus lactic acidosis, probably combined etiology.  Is likely ischemic ATN in setting of septic shock and now cardiac arrest.  Given no urine production, likely will need CRRT today.  -Nephrology to start CRRT today -Monitor RFP  #Atrial fibrillation, rate controlled Restarted back on heparin.  Still on amiodarone.  Has not converted to sinus rhythm at this time.  Rates have been controlled in the 80s.  -stop amiodarone -Still in A-fib -continue heparin for now   #Anemia of critical illness/ chronic diease #Thrombocytopenia Hemoglobin stable this morning.  No acute concerns.  Thrombocytopenia down to 35,000.  This likely in setting of sepsis. -Continue heparin  -Monitor CBC -Monitor for any signs of bleeding   #Elevated liver enzymes Elevated liver enzymes, likely in setting of shock  liver from vascular congestion and cardiac arrest.   -Follow-up hepatic function panel  #Hypertension Hypotensive currently.  Home medications include carvedilol 12.5 mg twice daily, hydralazine 100 mg every 8 hours, losartan 100 mg daily -Hold home meds  #Type 2 diabetes mellitus Glucose elevated into the190s  Home medication does include glipizide 5 mg twice daily.  Will continue on sliding scale here. -Continue on Semglee 5 units daily -Increase sliding scale to resistant  #Hyperlipidemia Home medication includes Lipitor 20 mg daily -Given underlying liver injury, will hold at this time  Best Practice (right click and "Reselect all SmartList Selections" daily)   Diet/type: NPO w/ meds via tube DVT prophylaxis: systemic heparin GI prophylaxis: N/A Lines: N/A Foley:  N/A Code Status:  full code Last date of multidisciplinary goals of care discussion with nephew with Dr. [name] who made the patient a DNR after the events that took place today   Labs   CBC: Recent Labs  Lab 10/25/22 2154 10/26/22 0250 10/26/22 1005 10/26/22 1014 10/26/22 1139 10/27/22 0308 10/27/22 0450  WBC 10.6* 11.4* 9.3  --  11.2* 14.4*  --   NEUTROABS 9.4*  --  8.0*  --   --   --   --   HGB 9.6* 8.5*  8.8* 8.3* 8.8* 8.4* 9.1* 9.5*  HCT 30.8* 26.3*  26.0* 25.5* 26.0* 25.9* 27.3* 28.0*  MCV 85.6 81.9 82.3  --  81.2 81.3  --   PLT 53* 53* 50*  --  49*  35*  --     Basic Metabolic Panel: Recent Labs  Lab 10/25/22 2050 10/26/22 0250 10/26/22 1005 10/26/22 1014 10/26/22 1752 10/27/22 0308 10/27/22 0450  NA 137 137  136 139 138 139 136 138  K 4.2 4.1  4.1 3.3* 3.4* 4.3 4.1 3.9  CL 101 99 98  --  95* 95*  --   CO2 16* 19* 21*  --  20* 19*  --   GLUCOSE 193* 272* 211*  --  197* 219*  --   BUN 94* 98* 104*  --  111* 122*  --   CREATININE 7.93* 7.54* 7.89*  --  8.14* 8.45*  --   CALCIUM 7.3* 6.8* 8.1*  --  7.9* 7.5*  --   MG  --  1.8  --   --   --  2.3  --   PHOS  --  6.9*  --   --   --   --    --    GFR: Estimated Creatinine Clearance: 9 mL/min (A) (by C-G formula based on SCr of 8.45 mg/dL (H)). Recent Labs  Lab 10/25/22 2050 10/25/22 2154 10/26/22 0250 10/26/22 0815 10/26/22 1005 10/26/22 1139 10/26/22 1244 10/26/22 1554 10/27/22 0308  PROCALCITON >150.00  --   --   --   --   --   --   --   --   WBC  --    < > 11.4*  --  9.3 11.2*  --   --  14.4*  LATICACIDVEN  --    < > 3.0* 4.1*  --   --  6.3* 5.8*  --    < > = values in this interval not displayed.    Liver Function Tests: Recent Labs  Lab 10/25/22 1527 10/27/22 0308  AST 163* 906*  ALT 59* 646*  ALKPHOS 133* 123  BILITOT 0.8 1.5*  PROT 6.7 5.6*  ALBUMIN 2.9* 2.0*   No results for input(s): "LIPASE", "AMYLASE" in the last 168 hours. No results for input(s): "AMMONIA" in the last 168 hours.  ABG    Component Value Date/Time   PHART 7.388 10/27/2022 0450   PCO2ART 33.7 10/27/2022 0450   PO2ART 138 (H) 10/27/2022 0450   HCO3 20.3 10/27/2022 0450   TCO2 21 (L) 10/27/2022 0450   ACIDBASEDEF 4.0 (H) 10/27/2022 0450   O2SAT 99 10/27/2022 0450     Coagulation Profile: No results for input(s): "INR", "PROTIME" in the last 168 hours.  Cardiac Enzymes: No results for input(s): "CKTOTAL", "CKMB", "CKMBINDEX", "TROPONINI" in the last 168 hours.  HbA1C: Hgb A1c MFr Bld  Date/Time Value Ref Range Status  10/26/2022 10:05 AM 7.5 (H) 4.8 - 5.6 % Final    Comment:    (NOTE) Pre diabetes:          5.7%-6.4%  Diabetes:              >6.4%  Glycemic control for   <7.0% adults with diabetes     CBG: Recent Labs  Lab 10/26/22 1101 10/26/22 1549 10/26/22 1944 10/26/22 2330 10/27/22 0315  GLUCAP 174* 176* 183* 196* 196*    Review of Systems:   Constitutional: negative for anorexia, fevers and sweats  Eyes: negative for irritation, redness and visual disturbance  Ears, nose, mouth, throat, and face: negative for earaches, epistaxis, nasal congestion and sore throat  Respiratory: negative for  cough,  sputum and wheezing  Cardiovascular: negative for chest pain, orthopnea, palpitations and syncope  Gastrointestinal: negative for abdominal pain,  constipation, diarrhea, melena, nausea and vomiting  Genitourinary:negative for dysuria, frequency and hematuria  Hematologic/lymphatic: negative for bleeding, easy bruising and lymphadenopathy  Musculoskeletal:negative for arthralgias, muscle weakness and stiff joints  Neurological: negative for coordination problems, gait problems, headaches and weakness  Endocrine: negative for diabetic symptoms including polydipsia, polyuria and weight loss   Past Medical History:  He,  has a past medical history of Diabetes mellitus without complication (HCC), GERD (gastroesophageal reflux disease), Hypertension, and Prostate cancer (HCC).   Surgical History:  No past surgical history on file.   Social History:   reports that he has never smoked. He does not have any smokeless tobacco history on file. He reports that he does not drink alcohol and does not use drugs.   Family History:  His family history is not on file.   Allergies Allergies  Allergen Reactions   Ferrous Sulfate Other (See Comments)   Penicillins Hives and Other (See Comments)    Has patient had a PCN reaction causing immediate rash, facial/tongue/throat swelling, SOB or lightheadedness with hypotension: no Has patient had a PCN reaction causing severe rash involving mucus membranes or skin necrosis: no Has patient had a PCN reaction that required hospitalization: yes Has patient had a PCN reaction occurring within the last 10 years: no If all of the above answers are "NO", then may proceed with Cephalosporin use.     Home Medications  Prior to Admission medications   Medication Sig Start Date End Date Taking? Authorizing Provider  carvedilol (COREG) 25 MG tablet Take 25 mg by mouth 2 (two) times daily with a meal. 07/08/22  Yes [provider]  allopurinol  (ZYLOPRIM) 100 MG tablet Take 50 mg by mouth every morning. For high uric acid level    [provider]  Alogliptin Benzoate 12.5 MG TABS Take 6.25 mg by mouth every morning. For diabetes (replaces metformin)    [provider]  atorvastatin (LIPITOR) 20 MG tablet Take 20 mg by mouth at bedtime. For cholesterol    [provider]  Carboxymethylcellulose Sodium 0.25 % SOLN Place 1 drop into both eyes 3 (three) times daily.    [provider]  Cholecalciferol (VITAMIN D) 50 MCG (2000 UT) tablet Take 4,000 Units by mouth in the morning.    [provider]  ciprofloxacin (CIPRO) 500 MG tablet Take 1 tablet (500 mg total) by mouth every 12 (twelve) hours. 06/14/20   Arthor Captain, PA-C  ferrous sulfate 325 (65 FE) MG tablet Take 325 mg by mouth See admin instructions. Take one tablet (325 mg) by mouth three days week - Monday, Wednesday, Thursday    [provider]  glipiZIDE (GLUCOTROL) 10 MG tablet Take 10 mg by mouth 2 (two) times daily before a meal. For diabetes    [provider]  hydrALAZINE (APRESOLINE) 100 MG tablet Take 100 mg by mouth every 8 (eight) hours.    [provider]  lisinopril (ZESTRIL) 10 MG tablet Take 10 mg by mouth in the morning.    [provider]  metroNIDAZOLE (FLAGYL) 500 MG tablet Take 1 tablet (500 mg total) by mouth 2 (two) times daily. 06/14/20   Harris, Cammy Copa, PA-C  oxyCODONE (ROXICODONE) 5 MG immediate release tablet Take 0.5-1 tablets (2.5-5 mg total) by mouth every 6 (six) hours as needed for severe pain. 06/14/20   Arthor Captain, PA-C     Critical care time: 40 mins    Modena Slater Internal medicine resident PGY-2 [phone]  If no response  to pager , please call [phone] until 7 pm After 7:00 pm call Elink  [phone]   10/27/2022

## 2022-10-27 NOTE — Progress Notes (Addendum)
Advanced Heart Failure Rounding Note  PCP-Cardiologist: None   Subjective:    Extubated this am.  Pressor requirements down significantly. On 4 NE. Off vaso and Epi. CO-OX 100% >> not accurate.  Remains in Afib, rate 90s.  Minimal UOP. Started CRRT today.   Awake. Following commands. Answering questions.   Objective:   Weight Range: 101.1 kg Body mass index is 33.89 kg/m.   Vital Signs:   Temp:  [97.6 F (36.4 C)-99 F (37.2 C)] 97.6 F (36.4 C) (07/25 1135) Pulse Rate:  [66-187] 71 (07/25 1500) Resp:  [0-30] 15 (07/25 1500) BP: (137-141)/(64-68) 137/64 (07/25 0740) SpO2:  [66 %-100 %] 100 % (07/25 1500) Arterial Line BP: (90-167)/(41-74) 105/52 (07/25 1500) FiO2 (%):  [40 %-70 %] 40 % (07/25 0800) Weight:  [101.1 kg] 101.1 kg (07/25 0500) Last BM Date : 10/26/22  Weight change: Filed Weights   10/25/22 2200 10/26/22 0500 10/27/22 0500  Weight: 101.1 kg 101.1 kg 101.1 kg    Intake/Output:   Intake/Output Summary (Last 24 hours) at 10/27/2022 1516 Last data filed at 10/27/2022 1500 Gross per 24 hour  Intake 1697.37 ml  Output 572 ml  Net 1125.37 ml      Physical Exam    General:  Fatigued appearing. HEENT: Normal Neck: Supple. R internal jugular HD cath. Carotids 2+ bilat; no bruits.  Cor: PMI nondisplaced. Irregular rhythm. No rubs, gallops or murmurs. Lungs: Clear Abdomen: Soft, nontender, nondistended.  Extremities: No cyanosis, clubbing, rash, edema Neuro: Alert & orientedx3. Affect pleasant   Telemetry   Afib 90s  Labs    CBC Recent Labs    10/25/22 2154 10/26/22 0250 10/26/22 1005 10/26/22 1014 10/26/22 1139 10/27/22 0308 10/27/22 0450  WBC 10.6*   < > 9.3  --  11.2* 14.4*  --   NEUTROABS 9.4*  --  8.0*  --   --   --   --   HGB 9.6*   < > 8.3*   < > 8.4* 9.1* 9.5*  HCT 30.8*   < > 25.5*   < > 25.9* 27.3* 28.0*  MCV 85.6   < > 82.3  --  81.2 81.3  --   PLT 53*   < > 50*  --  49* 35*  --    < > = values in this interval  not displayed.   Basic Metabolic Panel Recent Labs    95/62/13 0250 10/26/22 1005 10/26/22 1752 10/27/22 0308 10/27/22 0450  NA 137  136   < > 139 136 138  K 4.1  4.1   < > 4.3 4.1 3.9  CL 99   < > 95* 95*  --   CO2 19*   < > 20* 19*  --   GLUCOSE 272*   < > 197* 219*  --   BUN 98*   < > 111* 122*  --   CREATININE 7.54*   < > 8.14* 8.45*  --   CALCIUM 6.8*   < > 7.9* 7.5*  --   MG 1.8  --   --  2.3  --   PHOS 6.9*  --   --   --   --    < > = values in this interval not displayed.   Liver Function Tests Recent Labs    10/25/22 1527 10/27/22 0308  AST 163* 906*  ALT 59* 646*  ALKPHOS 133* 123  BILITOT 0.8 1.5*  PROT 6.7 5.6*  ALBUMIN 2.9* 2.0*   No  results for input(s): "LIPASE", "AMYLASE" in the last 72 hours. Cardiac Enzymes No results for input(s): "CKTOTAL", "CKMB", "CKMBINDEX", "TROPONINI" in the last 72 hours.  BNP: BNP (last 3 results) Recent Labs    10/25/22 1528  BNP 719.5*    ProBNP (last 3 results) No results for input(s): "PROBNP" in the last 8760 hours.   D-Dimer Recent Labs    10/25/22 2154  DDIMER 13.93*   Hemoglobin A1C Recent Labs    10/26/22 1005  HGBA1C 7.5*   Fasting Lipid Panel No results for input(s): "CHOL", "HDL", "LDLCALC", "TRIG", "CHOLHDL", "LDLDIRECT" in the last 72 hours. Thyroid Function Tests No results for input(s): "TSH", "T4TOTAL", "T3FREE", "THYROIDAB" in the last 72 hours.  Invalid input(s): "FREET3"  Other results:   Imaging    DG Abd 1 View  Result Date: 10/26/2022 CLINICAL DATA:  Check feeding catheter placement EXAM: ABDOMEN - 1 VIEW COMPARISON:  None Available. FINDINGS: Gastric catheter is noted within the stomach. No free air is seen. No obstructive changes are noted. IMPRESSION: Gastric catheter within the stomach. Electronically Signed   By: Alcide Clever M.D.   On: 10/26/2022 21:44     Medications:     Scheduled Medications:  Chlorhexidine Gluconate Cloth  6 each Topical Daily   docusate   100 mg Per Tube BID   hydrocortisone sod succinate (SOLU-CORTEF) inj  100 mg Intravenous Q12H   insulin aspart  0-20 Units Subcutaneous Q4H   insulin glargine-yfgn  5 Units Subcutaneous Daily   mouth rinse  15 mL Mouth Rinse Q2H   pantoprazole (PROTONIX) IV  40 mg Intravenous Q24H   polyethylene glycol  17 g Per Tube Daily    Infusions:   prismasol BGK 4/2.5 500 mL/hr at 10/27/22 1124    prismasol BGK 4/2.5 300 mL/hr at 10/27/22 1124   sodium chloride 10 mL/hr at 10/27/22 1500   sodium chloride     amiodarone 60 mg/hr (10/27/22 1424)   Followed by   amiodarone     heparin 1,550 Units/hr (10/27/22 1500)   meropenem (MERREM) IV Stopped (10/27/22 1341)   norepinephrine (LEVOPHED) Adult infusion 10 mcg/min (10/27/22 1500)   prismasol BGK 4/2.5 1,500 mL/hr at 10/27/22 1447   vasopressin 0.04 Units/min (10/27/22 1500)    PRN Medications: Place/Maintain arterial line **AND** sodium chloride, acetaminophen, docusate sodium, fentaNYL (SUBLIMAZE) injection, fentaNYL (SUBLIMAZE) injection, heparin, midazolam, mouth rinse, polyethylene glycol, sodium chloride    Patient Profile   72 y.o. male with history of DM II, HTN, CKD IIIb/IV, prostate cancer. Admitted with shock, metabolic acidosis and AKI on CKD.    Suffered cardiac arrest and found to have bacteremia and new systolic CHF. Advanced Heart Failure asked to see to assist with managing shock.  Assessment/Plan   Shock -Likely septic + ? 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  -BC + for Enterobacter cloacae. Now on meropenem per CCM. Norovirus in stool. -Stress dose steroids -Lactic acid improved to 2.7. Recheck to ensure clearance. -Significantly improved. Off all pressors except 4 NE. Wean as able. -Echo: EF 20-25%, moderate LVH, RV severely reduced -HS troponin 2,879>2,461>1,284, flat trend. Suspect demand ischemia but does have risk factors for CAD -Recheck echo tomorrow  2. Acute biventricular systolic  CHF: -Echo as above -Suspect possible septic cardiomyopathy -volume management with CRRT.   3. Cardiac arrest: -07/23-Became bradycardic and had PEA arrest, CPR with ROSC after 10 minutes. Then had Vfib s/p defibrillation >>Afib.   4. Elevated troponin -Peaked at 2,879, flat trend -Suspect demand ischemia as above -  Family reports he had not been complaining of any chest pain   5. AKI on CKD IV Metabolic acidosis -Scr baseline around 2.4, peaked at 8.7. AKI in setting of shock. -Nephrology following -Started CRRT today  6. Atrial fibrillation  -New -Start amiodarone gtt.  -On heparin gtt   7. Elevated LFTs -Likely shock liver -Follow   8. Acute hypoxic respiratory failure -Extubated this am   9. DM II -A1c 7.5% -Per primary   10. Anemia History of iron deficiency -Hgb 15.6>>>8.4 -No obvious source of bleeding -T sat 7%, consider IV iron once infection treated   11. Thrombocytopenia -? D/t septic shock -Platelets 53>49>35K, low even prior to starting heparin -Follow closely   ADDENDUM 5:30 PM: Notified that patient became hypotensive while receiving amiodarone bolus. NE increased to 30 and vaso added back.   Amiodarone stopped.  Pressor requirements slowly decreasing.  Length of Stay: 2  FINCH, LINDSAY N, PA-C  10/27/2022, 3:16 PM  Advanced Heart Failure Team Pager [phone] (M-F; 7a - 5p)  Please contact [facility] for night-coverage after hours (5p -7a ) and weekends on [email or web address]   Agree with above.   Now extubated. Anuric. Now on CVVHD. On NE 4.   Remains in AF. BCx + for Enterobacter cloacae. Now on meropenem per CCM. Norovirus in stool.  General:  Sitting up in bed No resp difficulty HEENT: normal Neck: supple. RIJ HD cath. Carotids 2+ bilat; no bruits. No lymphadenopathy or thryomegaly appreciated. [id number] rate & rhythm. No rubs, gallops or murmurs. Lungs: clear Abdomen: soft, nontender, nondistended. No hepatosplenomegaly. No  bruits or masses. Good bowel sounds. Extremities: no cyanosis, clubbing, rash, 1+ edema Neuro: alert & orientedx3, cranial nerves grossly intact. moves all 4 extremities w/o difficulty. Affect pleasant  He remains critically ill due to septic shock. Course c/b mild demand NSTEMI and new systolic dysfunction EF 20-25%  Also having AF. 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 Not candidate for cath currently with AKI/ESRD and low platelets. Will restart amio for AF. Wean NE as tolerated. Will repeat echo as he recovers.   CRITICAL CARE Performed by: Arvilla Meres  Total critical care time: 40 minutes  Critical care time was exclusive of separately billable procedures and treating other patients.  Critical care was necessary to treat or prevent imminent or life-threatening deterioration.  Critical care was time spent personally by me (independent of midlevel providers or residents) on the following activities: development of treatment plan with patient and/or surrogate as well as nursing, discussions with consultants, evaluation of patient's response to treatment, examination of patient, obtaining history from patient or surrogate, ordering and performing treatments and interventions, ordering and review of laboratory studies, ordering and review of radiographic studies, pulse oximetry and re-evaluation of patient's condition.  Arvilla Meres, MD  7:58 PM

## 2022-10-27 NOTE — Progress Notes (Signed)
ANTICOAGULATION CONSULT NOTE - Follow Up  Pharmacy Consult for Heparin Indication: afib  Allergies  Allergen Reactions   Ferrous Sulfate Other (See Comments)   Penicillins Hives and Other (See Comments)    Has patient had a PCN reaction causing immediate rash, facial/tongue/throat swelling, SOB or lightheadedness with hypotension: no Has patient had a PCN reaction causing severe rash involving mucus membranes or skin necrosis: no Has patient had a PCN reaction that required hospitalization: yes Has patient had a PCN reaction occurring within the last 10 years: no If all of the above answers are "NO", then may proceed with Cephalosporin use.    Patient Measurements: Height: 5\' 8"  (172.7 cm) Weight: 101.1 kg (222 lb 14.2 oz) IBW/kg (Calculated) : 68.4 Heparin Dosing Weight: 88.3 kg  Vital Signs: Temp: 98.3 F (36.8 C) (07/25 0809) Temp Source: Axillary (07/25 0809) BP: 137/64 (07/25 0740) Pulse Rate: 83 (07/25 0945)  Labs: Recent Labs    10/25/22 1658 10/25/22 2050 10/26/22 1005 10/26/22 1014 10/26/22 1139 10/26/22 1752 10/26/22 2338 10/27/22 0308 10/27/22 0450 10/27/22 0812  HGB  --    < > 8.3*   < > 8.4*  --   --  9.1* 9.5*  --   HCT  --    < > 25.5*   < > 25.9*  --   --  27.3* 28.0*  --   PLT  --    < > 50*  --  49*  --   --  35*  --   --   HEPARINUNFRC  --    < >  --   --  0.12*  --  0.20*  --   --  0.31  CREATININE  --    < > 7.89*  --   --  8.14*  --  8.45*  --   --   TROPONINIHS 2,461*  --  1,284*  --  1,375*  --   --   --   --   --    < > = values in this interval not displayed.    Estimated Creatinine Clearance: 9 mL/min (A) (by C-G formula based on SCr of 8.45 mg/dL (H)).   Medical History: Past Medical History:  Diagnosis Date   Diabetes mellitus without complication (HCC)    GERD (gastroesophageal reflux disease)    Hypertension    Prostate cancer (HCC)     Medications:  Medications Prior to Admission  Medication Sig Dispense Refill Last Dose    allopurinol (ZYLOPRIM) 100 MG tablet Take 50 mg by mouth every other day. For high uric acid level   unk   Alogliptin Benzoate 12.5 MG TABS Take 6.25 mg by mouth every morning. For diabetes (replaces metformin)   unk   atorvastatin (LIPITOR) 20 MG tablet Take 20 mg by mouth at bedtime. For cholesterol   unk   carvedilol (COREG) 25 MG tablet Take 12.5 mg by mouth 2 (two) times daily with a meal.   un   glipiZIDE (GLUCOTROL) 10 MG tablet Take 5 mg by mouth 2 (two) times daily before a meal. For diabetes   unk   hydrALAZINE (APRESOLINE) 100 MG tablet Take 100 mg by mouth every 8 (eight) hours.   unk   losartan (COZAAR) 100 MG tablet Take 100 mg by mouth daily.   unk   Scheduled:   Chlorhexidine Gluconate Cloth  6 each Topical Daily   docusate  100 mg Per Tube BID   hydrocortisone sod succinate (SOLU-CORTEF) inj  100 mg  Intravenous Q12H   insulin aspart  0-20 Units Subcutaneous Q4H   insulin glargine-yfgn  5 Units Subcutaneous Daily   mouth rinse  15 mL Mouth Rinse Q2H   pantoprazole (PROTONIX) IV  40 mg Intravenous Q24H   polyethylene glycol  17 g Per Tube Daily   Infusions:    prismasol BGK 4/2.5      prismasol BGK 4/2.5     sodium chloride 10 mL/hr at 10/27/22 0900   sodium chloride     calcium gluconate 1,000 mg (10/27/22 1018)   heparin 1,500 Units/hr (10/27/22 0900)   meropenem (MERREM) IV 1 g (10/27/22 1016)   norepinephrine (LEVOPHED) Adult infusion 16 mcg/min (10/27/22 0925)   prismasol BGK 4/2.5     vasopressin 0.04 Units/min (10/27/22 0900)   PRN: Place/Maintain arterial line **AND** sodium chloride, acetaminophen, docusate sodium, fentaNYL (SUBLIMAZE) injection, fentaNYL (SUBLIMAZE) injection, heparin, midazolam, mouth rinse, polyethylene glycol, sodium chloride  Assessment: 71 yom with history of DM, prostate cancer remission, PUD and GIB presenting with SOB. Heparin per pharmacy consult placed for chest pain/ACS - planned 48 hrs of treatment for NSTEMI, but now heparin  continuing for new afib. Patient is not on anticoagulation PTA.  Heparin held 7/24 AM after cardiac arrest. Cleared by CCM to restart later in the day 7/24. Will be cautious in dosing given low platelets (chronic - 74 on admission >> down to 35 today). Hg low but stable. Noted, initiating on CRRT 7/25.  Heparin level resulted therapeutic this morning at low end of goal 0.31 after rate increase this morning. CCM ok to continue and monitor platelet trend for now. No bleeding or issues with infusion per discussion with RN.  Goal of Therapy:  Heparin level 0.3-0.7 units/ml Monitor platelets by anticoagulation protocol: Yes   Plan:  Increase heparin slightly to 1550 units/hr to ensure stays in range Monitor daily heparin level/CBC, s/sx bleeding   Leia Alf, PharmD, BCPS Please check AMION for all [facility] contact numbers Clinical Pharmacist 10/27/2022 11:01 AM

## 2022-10-27 NOTE — Procedures (Signed)
Extubation Procedure Note  Patient Details:   Name: Troy Johnston DOB: Feb 04, 1951 MRN: [id number]   Airway Documentation:    Vent end date: 10/27/22 Vent end time: 1141   Evaluation  O2 sats: stable throughout Complications: No apparent complications Patient did tolerate procedure well. Bilateral Breath Sounds: Rhonchi, Diminished   Yes,  Per CCM order, RT extubated pt to [place]. Prior to extubation, pt did have a positive cuff leak. Pt tolerated well with SVS, no stridor noted, pt was able to state his name.  Megan Mans 10/27/2022, 11:42 AM

## 2022-10-27 NOTE — Progress Notes (Signed)
ANTICOAGULATION CONSULT NOTE Pharmacy Consult for Heparin Indication: chest pain/ACS Brief A/P: Heparin level subtherapeutic Increase Heparin rate  Allergies  Allergen Reactions   Ferrous Sulfate Other (See Comments)   Penicillins Hives and Other (See Comments)    Has patient had a PCN reaction causing immediate rash, facial/tongue/throat swelling, SOB or lightheadedness with hypotension: no Has patient had a PCN reaction causing severe rash involving mucus membranes or skin necrosis: no Has patient had a PCN reaction that required hospitalization: yes Has patient had a PCN reaction occurring within the last 10 years: no If all of the above answers are "NO", then may proceed with Cephalosporin use.    Patient Measurements: Height: 5\' 8"  (172.7 cm) Weight: 101.1 kg (222 lb 14.2 oz) IBW/kg (Calculated) : 68.4 Heparin Dosing Weight: 88.3 kg  Vital Signs: Temp: 98.6 F (37 C) (07/24 2337) Temp Source: Oral (07/24 2337) BP: 141/68 (07/24 2115) Pulse Rate: 74 (07/25 0000)  Labs: Recent Labs    10/25/22 1658 10/25/22 2050 10/26/22 0250 10/26/22 1005 10/26/22 1014 10/26/22 1139 10/26/22 1752 10/26/22 2338  HGB  --    < > 8.5*  8.8* 8.3* 8.8* 8.4*  --   --   HCT  --    < > 26.3*  26.0* 25.5* 26.0* 25.9*  --   --   PLT  --    < > 53* 50*  --  49*  --   --   HEPARINUNFRC  --   --  0.10*  --   --  0.12*  --  0.20*  CREATININE  --    < > 7.54* 7.89*  --   --  8.14*  --   TROPONINIHS 2,461*  --   --  1,284*  --  1,375*  --   --    < > = values in this interval not displayed.    Estimated Creatinine Clearance: 9.3 mL/min (A) (by C-G formula based on SCr of 8.14 mg/dL (H)).  Assessment: 72 y.o. male with NSTEMI/Afib s/p cardiac arrest for heparin  Goal of Therapy:  Heparin level 0.3-0.7 units/ml Monitor platelets by anticoagulation protocol: Yes   Plan:  Increase Heparin 1500 units/hr Check heparin level in 8 hours.  Geannie Risen, PharmD, BCPS  10/27/2022, 12:27 AM

## 2022-10-28 DIAGNOSIS — I5021 Acute systolic (congestive) heart failure: Secondary | ICD-10-CM | POA: Diagnosis not present

## 2022-10-28 DIAGNOSIS — I5082 Biventricular heart failure: Secondary | ICD-10-CM | POA: Diagnosis not present

## 2022-10-28 DIAGNOSIS — N189 Chronic kidney disease, unspecified: Secondary | ICD-10-CM | POA: Diagnosis not present

## 2022-10-28 DIAGNOSIS — A419 Sepsis, unspecified organism: Secondary | ICD-10-CM | POA: Diagnosis not present

## 2022-10-28 DIAGNOSIS — N179 Acute kidney failure, unspecified: Secondary | ICD-10-CM | POA: Diagnosis not present

## 2022-10-28 DIAGNOSIS — R6521 Severe sepsis with septic shock: Secondary | ICD-10-CM | POA: Diagnosis not present

## 2022-10-28 LAB — RENAL FUNCTION PANEL: Glucose, Bld: 213 mg/dL — ABNORMAL HIGH (ref 70–99)

## 2022-10-28 LAB — GLUCOSE, CAPILLARY
Glucose-Capillary: 138 mg/dL — ABNORMAL HIGH (ref 70–99)
Glucose-Capillary: 139 mg/dL — ABNORMAL HIGH (ref 70–99)
Glucose-Capillary: 150 mg/dL — ABNORMAL HIGH (ref 70–99)
Glucose-Capillary: 153 mg/dL — ABNORMAL HIGH (ref 70–99)
Glucose-Capillary: 161 mg/dL — ABNORMAL HIGH (ref 70–99)
Glucose-Capillary: 183 mg/dL — ABNORMAL HIGH (ref 70–99)
Glucose-Capillary: 203 mg/dL — ABNORMAL HIGH (ref 70–99)

## 2022-10-28 LAB — HEPATIC FUNCTION PANEL: Total Bilirubin: 1.9 mg/dL — ABNORMAL HIGH (ref 0.3–1.2)

## 2022-10-28 MED ORDER — RENA-VITE PO TABS
1.0000 | ORAL_TABLET | Freq: Every day | ORAL | Status: DC
  Administered 2022-10-28 – 2022-11-08 (×12): 1 via ORAL
  Filled 2022-10-28 (×12): qty 1

## 2022-10-28 MED ORDER — LIDOCAINE 5 % EX PTCH: 1.0000 | MEDICATED_PATCH | CUTANEOUS | Status: DC

## 2022-10-28 MED ORDER — PANTOPRAZOLE SODIUM 40 MG PO TBEC
40.0000 mg | DELAYED_RELEASE_TABLET | Freq: Every day | ORAL | Status: DC
  Administered 2022-10-29 – 2022-11-08 (×11): 40 mg via ORAL
  Filled 2022-10-28 (×11): qty 1

## 2022-10-28 MED ORDER — DOCUSATE SODIUM 100 MG PO CAPS
100.0000 mg | ORAL_CAPSULE | Freq: Two times a day (BID) | ORAL | Status: DC
  Administered 2022-10-28 – 2022-11-03 (×13): 100 mg via ORAL
  Filled 2022-10-28 (×20): qty 1

## 2022-10-28 MED ORDER — HYDROCORTISONE SOD SUC (PF) 100 MG IJ SOLR
100.0000 mg | Freq: Every day | INTRAMUSCULAR | Status: AC
  Administered 2022-10-28: 100 mg via INTRAVENOUS
  Filled 2022-10-28: qty 2

## 2022-10-28 MED ORDER — ENSURE ENLIVE PO LIQD
237.0000 mL | Freq: Three times a day (TID) | ORAL | Status: DC
  Administered 2022-10-28 – 2022-11-09 (×27): 237 mL via ORAL

## 2022-10-28 MED ORDER — POLYETHYLENE GLYCOL 3350 17 G PO PACK
17.0000 g | PACK | Freq: Every day | ORAL | Status: DC
  Administered 2022-10-29 – 2022-11-03 (×4): 17 g via ORAL
  Filled 2022-10-28 (×7): qty 1

## 2022-10-28 NOTE — Progress Notes (Addendum)
Advanced Heart Failure Rounding Note  PCP-Cardiologist: None   Subjective:    Extubated 7/25  Remains on NE 9 and VP 0.04  Remains in Afib, rate 80-90s. GOt hypotensive with IV amio so stopped.    On CRRT today.   Awake conversant. Says he feels ok  Objective:   Weight Range: 101.1 kg Body mass index is 33.89 kg/m.   Vital Signs:   Temp:  [97.5 F (36.4 C)-98.6 F (37 C)] 97.5 F (36.4 C) (07/26 0400) Pulse Rate:  [71-92] 88 (07/26 0600) Resp:  [0-27] 20 (07/26 0600) BP: (137)/(64) 137/64 (07/25 0740) SpO2:  [97 %-100 %] 97 % (07/26 0600) Arterial Line BP: (90-167)/(43-74) 123/62 (07/26 0600) FiO2 (%):  [40 %] 40 % (07/25 0800) Weight:  [101.1 kg] 101.1 kg (07/26 0500) Last BM Date : 10/26/22  Weight change: Filed Weights   10/26/22 0500 10/27/22 0500 10/28/22 0500  Weight: 101.1 kg 101.1 kg 101.1 kg    Intake/Output:   Intake/Output Summary (Last 24 hours) at 10/28/2022 1610 Last data filed at 10/28/2022 0600 Gross per 24 hour  Intake 1550.86 ml  Output 2328.3 ml  Net -777.44 ml      Physical Exam    General:  Sitting up in bed  No resp difficulty HEENT: normal Neck: supple. RIJ HD cath Carotids 2+ bilat; no bruits. No lymphadenopathy or thryomegaly appreciated. Cor: PMI nondisplaced. Irregular rate & rhythm. No rubs, gallops or murmurs. Lungs: clear Abdomen: soft, nontender, nondistended. No hepatosplenomegaly. No bruits or masses. Good bowel sounds. Extremities: no cyanosis, clubbing, rash, 1+ edema Neuro: alert & orientedx3, cranial nerves grossly intact. moves all 4 extremities w/o difficulty. Affect pleasant    Telemetry   Afib 80-90s Personally reviewed  Labs    CBC Recent Labs    10/25/22 2154 10/26/22 0250 10/26/22 1005 10/26/22 1014 10/27/22 0308 10/27/22 0450 10/28/22 0433  WBC 10.6*   < > 9.3   < > 14.4*  --  22.3*  NEUTROABS 9.4*  --  8.0*  --   --   --   --   HGB 9.6*   < > 8.3*   < > 9.1* 9.5* 9.1*  HCT 30.8*   <  > 25.5*   < > 27.3* 28.0* 26.9*  MCV 85.6   < > 82.3   < > 81.3  --  81.3  PLT 53*   < > 50*   < > 35*  --  38*   < > = values in this interval not displayed.   Basic Metabolic Panel Recent Labs    96/04/54 0250 10/26/22 1005 10/27/22 0308 10/27/22 0450 10/27/22 1614 10/28/22 0433  NA 137  136   < > 136 138 136  --   K 4.1  4.1   < > 4.1 3.9 4.0  --   CL 99   < > 95*  --  99  --   CO2 19*   < > 19*  --  20*  --   GLUCOSE 272*   < > 219*  --  200*  --   BUN 98*   < > 122*  --  109*  --   CREATININE 7.54*   < > 8.45*  --  6.63*  --   CALCIUM 6.8*   < > 7.5*  --  7.0*  --   MG 1.8  --  2.3  --   --  2.5*  PHOS 6.9*  --   --   --  5.3*  --    < > = values in this interval not displayed.   Liver Function Tests Recent Labs    10/27/22 0308 10/27/22 1614 10/28/22 0433  AST 906*  --  445*  ALT 646*  --  601*  ALKPHOS 123  --  104  BILITOT 1.5*  --  1.9*  PROT 5.6*  --  5.9*  ALBUMIN 2.0* 1.9* 1.9*   No results for input(s): "LIPASE", "AMYLASE" in the last 72 hours. Cardiac Enzymes No results for input(s): "CKTOTAL", "CKMB", "CKMBINDEX", "TROPONINI" in the last 72 hours.  BNP: BNP (last 3 results) Recent Labs    10/25/22 1528  BNP 719.5*    ProBNP (last 3 results) No results for input(s): "PROBNP" in the last 8760 hours.   D-Dimer Recent Labs    10/25/22 2154  DDIMER 13.93*   Hemoglobin A1C Recent Labs    10/26/22 1005  HGBA1C 7.5*   Fasting Lipid Panel No results for input(s): "CHOL", "HDL", "LDLCALC", "TRIG", "CHOLHDL", "LDLDIRECT" in the last 72 hours. Thyroid Function Tests No results for input(s): "TSH", "T4TOTAL", "T3FREE", "THYROIDAB" in the last 72 hours.  Invalid input(s): "FREET3"  Other results:   Imaging    No results found.   Medications:     Scheduled Medications:  Chlorhexidine Gluconate Cloth  6 each Topical Daily   docusate  100 mg Per Tube BID   hydrocortisone sod succinate (SOLU-CORTEF) inj  100 mg Intravenous Q12H    insulin aspart  0-20 Units Subcutaneous Q4H   insulin glargine-yfgn  5 Units Subcutaneous Daily   pantoprazole (PROTONIX) IV  40 mg Intravenous Q24H   polyethylene glycol  17 g Per Tube Daily    Infusions:   prismasol BGK 4/2.5 500 mL/hr at 10/28/22 0609    prismasol BGK 4/2.5 300 mL/hr at 10/28/22 1478   sodium chloride 10 mL/hr at 10/28/22 0600   sodium chloride     heparin 1,550 Units/hr (10/28/22 0600)   meropenem (MERREM) IV 1 g (10/28/22 0600)   norepinephrine (LEVOPHED) Adult infusion 9 mcg/min (10/28/22 0600)   prismasol BGK 4/2.5 1,500 mL/hr at 10/28/22 0421   vasopressin 0.04 Units/min (10/28/22 0601)    PRN Medications: Place/Maintain arterial line **AND** sodium chloride, acetaminophen, docusate sodium, fentaNYL (SUBLIMAZE) injection, fentaNYL (SUBLIMAZE) injection, heparin, midazolam, mouth rinse, polyethylene glycol, sodium chloride    Patient Profile   72 y.o. male with history of DM II, HTN, CKD IIIb/IV, prostate cancer. Admitted with shock, metabolic acidosis and AKI on CKD.    Suffered cardiac arrest and found to have bacteremia and new systolic CHF. Advanced Heart Failure asked to see to assist with managing shock.  Assessment/Plan   Shock -Likely septic + ? 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  -BC + for Enterobacter cloacae. Now on meropenem per CCM. Norovirus in stool. -Stress dose steroids -Improving but remains on NE and VP. Wean as tolerated -Echo: EF 20-25%, moderate LVH, RV severely reduced -HS troponin 2,879>2,461>1,284, flat trend. Suspect demand ischemia but does have risk factors for CAD  2. Acute biventricular systolic CHF: -Echo as above -Suspect possible septic cardiomyopathy with potential component of demand ischemia Type II NSTEMI - Not candidate for cath with CKD -> ESRD - BP support with NE/VP -volume management with CRRT. - Repeat echo when out of ICY  3. Cardiac arrest: -07/23-Became bradycardic and had  PEA arrest, CPR with ROSC after 10 minutes. Then had Vfib s/p defibrillation >>Afib.   4. Elevated troponin -> Type II NSTEMI -Peaked at  2,879, flat trend -Suspect demand ischemia as above -Family reports he had not been complaining of any chest pain   5. AKI on CKD IV Metabolic acidosis -Scr baseline around 2.4, peaked at 8.7. AKI in setting of shock. -Nephrology following -On CRRT  6. Atrial fibrillation  -New -Failed amiodarone gtt due to hypotension -> will try po amio -On heparin gtt   7. Elevated LFTs -Likely shock liver -Follow   8. Acute hypoxic respiratory failure -Extubated 7/25   9. DM II -A1c 7.5% -Per primary   10. Anemia History of iron deficiency -Hgb 15.6>>>8.4 -No obvious source of bleeding -T sat 7%, consider IV iron once infection treated   11. Thrombocytopenia - D/t septic shock -Platelets 53>49>35K -> 38k,  HF team will see again Monday. Please call over weekend with questions  CRITICAL CARE Performed by: Arvilla Meres  Total critical care time: 40 minutes  Critical care time was exclusive of separately billable procedures and treating other patients.  Critical care was necessary to treat or prevent imminent or life-threatening deterioration.  Critical care was time spent personally by me (independent of midlevel providers or residents) on the following activities: development of treatment plan with patient and/or surrogate as well as nursing, discussions with consultants, evaluation of patient's response to treatment, examination of patient, obtaining history from patient or surrogate, ordering and performing treatments and interventions, ordering and review of laboratory studies, ordering and review of radiographic studies, pulse oximetry and re-evaluation of patient's condition.   Length of Stay: 3  Arvilla Meres, MD  10/28/2022, 6:21 AM  Advanced Heart Failure Team Pager [phone] (M-F; 7a - 5p)  Please contact [facility]  for night-coverage after hours (5p -7a ) and weekends on [email or web address]

## 2022-10-28 NOTE — Evaluation (Signed)
Clinical/Bedside Swallow Evaluation Patient Details  Name: Troy Johnston MRN: [id number] Date of Birth: [birth year]  Today's Date: 10/28/2022 Time: SLP Start Time (ACUTE ONLY): 0927 SLP Stop Time (ACUTE ONLY): 0934 SLP Time Calculation (min) (ACUTE ONLY): 7 min  Past Medical History:  Past Medical History:  Diagnosis Date   Diabetes mellitus without complication (HCC)    GERD (gastroesophageal reflux disease)    Hypertension    Prostate cancer [facility])    Past Surgical History: No past surgical history on file. HPI:  Troy Johnston is a 72 year old male who initially presented with shortness of breath found to be in shock. Experienced PEA arrest in hospital with ROSC after 10 min. Pt required ETT 7/24-25. CXR 7/24: "Low lung volumes with probable bibasilar subsegmental atelectasis  and trace left pleural effusion."  Pt with a history metabolic syndrome, chronic kidney disease stage IV, and diabetes.    Assessment / Plan / Recommendation  Clinical Impression  Pt presents with functional swallowing as assessed clinically.  Pt tolerated all consistencies trialed with no clinical s/s of aspiration and exhibited good oral clearance of solids.  Pt with strong vocal quality following brief intubation.  Pt reports that he does wear dentures, but does not always wear them with PO intake.  Family will bring in dentures.  Pt may want to choose softer foods for ease of mastication, but is safe to resume a regular PO diet.  Recently placed on renal diet by team just prior to assessment.  Pt has no further ST needs.  SLP will sign off.   Recommend continuing regular texture diet with thin liquids.  SLP Visit Diagnosis: Dysphagia, unspecified (R13.10)    Aspiration Risk  No limitations    Diet Recommendation Regular;Thin liquid    Liquid Administration via: Cup;Straw Medication Administration:  (No specific precautions; as tolerated) Compensations: Slow rate;Small sips/bites Postural Changes:  Seated upright at 90 degrees    Other  Recommendations Oral Care Recommendations: Oral care BID    Recommendations for follow up therapy are one component of a multi-disciplinary discharge planning process, led by the attending physician.  Recommendations may be updated based on patient status, additional functional criteria and insurance authorization.  Follow up Recommendations No SLP follow up      Assistance Recommended at Discharge  N/A  Functional Status Assessment Patient has not had a recent decline in their functional status  Frequency and Duration  (N/A)          Prognosis Prognosis for improved oropharyngeal function:  (N/A)      Swallow Study   General Date of Onset: 10/28/22 HPI: Troy Johnston is a 72 year old male who initially presented with shortness of breath found to be in shock. Experienced PEA arrest in hospital with ROSC after 10 min. Pt required ETT 7/24-25. CXR 7/24: "Low lung volumes with probable bibasilar subsegmental atelectasis  and trace left pleural effusion."  Pt with a history metabolic syndrome, chronic kidney disease stage IV, and diabetes. Type of Study: Bedside Swallow Evaluation Previous Swallow Assessment: None Diet Prior to this Study: NPO Temperature Spikes Noted: No Respiratory Status: Nasal cannula History of Recent Intubation: Yes Total duration of intubation (days): 1 days Date extubated: 10/26/22 Behavior/Cognition: Alert;Cooperative;Pleasant mood Oral Care Completed by SLP: No Oral Cavity - Dentition: Missing dentition;Dentures, not available Patient Positioning: Upright in chair Baseline Vocal Quality: Normal Volitional Cough: Strong Volitional Swallow: Able to elicit    Oral/Motor/Sensory Function Overall Oral Motor/Sensory Function: Within functional limits Facial ROM: Within Functional Limits  Facial Symmetry: Within Functional Limits Lingual ROM: Within Functional Limits Lingual Symmetry: Within Functional Limits Lingual  Strength: Within Functional Limits Velum: Within Functional Limits Mandible: Within Functional Limits   Ice Chips Ice chips: Not tested   Thin Liquid Thin Liquid: Within functional limits Presentation: Straw;Cup    Nectar Thick Nectar Thick Liquid: Not tested   Honey Thick Honey Thick Liquid: Not tested   Puree Puree: Within functional limits Presentation: Spoon   Solid     Solid: Within functional limits Presentation: Spoon      Kerrie Pleasure, MA, CCC-SLP Acute Rehabilitation Services Office: [phone] 10/28/2022,9:56 AM

## 2022-10-28 NOTE — Progress Notes (Signed)
Nephrology Follow-Up Consult note   Assessment/Recommendations: Troy Johnston is a/an 72 y.o. male with a past medical history significant for HTN, DM2, CKD, admitted for septic shock and enterococcus bacteremia.       AKI on CKD IV:  likely hemodynamically mediated in the setting of shock.              - started CRRT on 7/25  - tolerating well, anuric currently  - negative 1L yesterday  -Temporary dialysis catheter in place, appreciate help from CCM  - consider stopping CRRT as pressor requirement decreases   2.  Shock: septic with enterobacter bacteremia             - abx per primary             - TTE no obvious vegetation              - norepi and vaso per primary   3.  Elevated troponins/ NSTEMI/PEA arrest/Afib w/ RVR             - cardiology following             - anticoagulation per primary   4.  Acute hypoxic RF:             - not extubated; weaning 02 as able   5.  Elevated LFTs; improving             - most likely d/t tissue ischemia   6.  Thrombocytopenia: stable             - likely in setting of septic shock   7.  Dispo: ICU   Recommendations conveyed to primary service.    Darnell Level [facility] 10/28/2022 9:04 AM  ___________________________________________________________  CC: SOB  Interval History/Subjective: Continue to require norepi and vaso. Awake and alert. Denies complaints. Extubated.   Medications:  Current Facility-Administered Medications  Medication Dose Route Frequency Provider Last Rate Last Admin    prismasol BGK 4/2.5 infusion   CRRT Continuous Darnell Level, MD 500 mL/hr at 10/28/22 0748 New Bag at 10/28/22 0748    prismasol BGK 4/2.5 infusion   CRRT Continuous Darnell Level, MD 300 mL/hr at 10/28/22 0609 New Bag at 10/28/22 0609   0.9 %  sodium chloride infusion  250 mL Intravenous Continuous Oretha Milch, MD 10 mL/hr at 10/28/22 0800 Infusion Verify at 10/28/22 0800   0.9 %  sodium chloride  infusion   Intra-arterial PRN Paliwal, Eliezer Lofts, MD       acetaminophen (TYLENOL) tablet 650 mg  650 mg Oral Q4H PRN Oretha Milch, MD       Chlorhexidine Gluconate Cloth 2 % PADS 6 each  6 each Topical Daily Oretha Milch, MD   6 each at 10/28/22 0530   docusate (COLACE) 50 MG/5ML liquid 100 mg  100 mg Per Tube BID Modena Slater, DO       docusate sodium (COLACE) capsule 100 mg  100 mg Oral BID PRN Oretha Milch, MD       heparin ADULT infusion 100 units/mL (25000 units/239mL)  1,550 Units/hr Intravenous Continuous von Dohlen, Haley B, RPH 15.5 mL/hr at 10/28/22 0813 1,550 Units/hr at 10/28/22 0813   heparin injection 1,000-6,000 Units  1,000-6,000 Units CRRT PRN Darnell Level, MD       hydrocortisone sodium succinate (SOLU-CORTEF) 100 MG injection 100 mg  100 mg Intravenous Daily Oretha Milch, MD  insulin aspart (novoLOG) injection 0-20 Units  0-20 Units Subcutaneous Q4H Modena Slater, DO   3 Units at 10/28/22 0800   insulin glargine-yfgn [facility]) injection 5 Units  5 Units Subcutaneous Daily Modena Slater, DO   5 Units at 10/27/22 1158   meropenem (MERREM) 1 g in sodium chloride 0.9 % 100 mL IVPB  1 g Intravenous Q8H von Pearletha Furl, RPH   Stopped at 10/28/22 1610   norepinephrine (LEVOPHED) 16 mg in (0.064 mg/mL) premix infusion  0-40 mcg/min Intravenous Titrated Cyril Mourning V, MD 7.5 mL/hr at 10/28/22 0800 8 mcg/min at 10/28/22 0800   Oral care mouth rinse  15 mL Mouth Rinse PRN Oretha Milch, MD       pantoprazole (PROTONIX) injection 40 mg  40 mg Intravenous Q24H Modena Slater, DO   40 mg at 10/27/22 1008   polyethylene glycol (MIRALAX / GLYCOLAX) packet 17 g  17 g Oral Daily PRN Oretha Milch, MD       polyethylene glycol (MIRALAX / GLYCOLAX) packet 17 g  17 g Per Tube Daily Modena Slater, DO       prismasol BGK 4/2.5 infusion   CRRT Continuous Darnell Level, MD 1,500 mL/hr at 10/28/22 0755 New Bag at 10/28/22 0755   sodium chloride 0.9 % primer fluid for CRRT   CRRT PRN  Darnell Level, MD       vasopressin (PITRESSIN) 20 Units in 100 mL (0.2 unit/mL) infusion-*FOR SHOCK*  0.04 Units/min Intravenous Continuous Paliwal, Aditya, MD 12 mL/hr at 10/28/22 0831 0.04 Units/min at 10/28/22 0831      Review of Systems: 12 systems reviewed and negative except per HPI  Physical Exam: Vitals:   10/28/22 0800 10/28/22 0815  BP:    Pulse: 76 87  Resp: 19 (!) 26  Temp: 97.9 F (36.6 C)   SpO2: 99% 97%   Total I/O In: 44.4 [I.V.:44.4] Out: 157.8   Intake/Output Summary (Last 24 hours) at 10/28/2022 9604 Last data filed at 10/28/2022 0800 Gross per 24 hour  Intake 1515.65 ml  Output 2678.1 ml  Net -1162.45 ml   Constitutional: Ill-appearing lying in bed, awake, alert ENMT: ears and nose without scars or lesions, MMM CV: normal rate, trace edema in the ankles Respiratory: Coarse bilateral breath sounds, bilateral chest rise, ventilated Gastrointestinal: soft, non-tender, no palpable masses or hernias Skin: no visible lesions or rashes Neuro: awake, alert, answers questions easily   Test Results I personally reviewed new and old clinical labs and radiology tests Lab Results  Component Value Date   NA 135 10/28/2022   K 3.9 10/28/2022   CL 100 10/28/2022   CO2 20 (L) 10/28/2022   BUN 80 (H) 10/28/2022   CREATININE 4.67 (H) 10/28/2022   CALCIUM 7.2 (L) 10/28/2022   ALBUMIN 2.0 (L) 10/28/2022   ALBUMIN 1.9 (L) 10/28/2022   PHOS 3.8 10/28/2022    CBC Recent Labs  Lab 10/25/22 2154 10/26/22 0250 10/26/22 1005 10/26/22 1014 10/26/22 1139 10/27/22 0308 10/27/22 0450 10/28/22 0433  WBC 10.6*   < > 9.3  --  11.2* 14.4*  --  22.3*  NEUTROABS 9.4*  --  8.0*  --   --   --   --   --   HGB 9.6*   < > 8.3*   < > 8.4* 9.1* 9.5* 9.1*  HCT 30.8*   < > 25.5*   < > 25.9* 27.3* 28.0* 26.9*  MCV 85.6   < > 82.3  --  81.2 81.3  --  81.3  PLT 53*   < > 50*  --  49* 35*  --  38*   < > = values in this interval not displayed.

## 2022-10-28 NOTE — Evaluation (Signed)
Physical Therapy Evaluation Patient Details Name: Troy Johnston MRN: [id number] DOB: 1951/01/23 Today's Date: 10/28/2022  History of Present Illness  72 yo male admitted 7/23 with SOB and shock. 7/24 PEA arrest with ROSC after 10 min ACLS, NSTEMI. Intubated 7/24-7/25. 7/25 CRRT initiated. PMhx: T2DM, CKD, HTN, prostate CA  Clinical Impression  Pt very pleasant and willing to mobilize with pt demonstrating decreased processing, weakness and difficulty with transfers with additional assist due to CRRT. Pt normally independent, lives with nephew and enjoys watching sports. Pt reports he has significant family assist at D/C and anticipate he will progress well once liberalized from lines. Pt will benefit from acute therapy to maximize mobility, safety, independence and function to decrease burden of care.  HR 81-95 SPO2 95-99% on RA      Assistance Recommended at Discharge Intermittent Supervision/Assistance  If plan is discharge home, recommend the following:  Can travel by private vehicle  A little help with walking and/or transfers;A little help with bathing/dressing/bathroom;Assistance with cooking/housework;Assist for transportation        Equipment Recommendations None recommended by PT  Recommendations for Other Services       Functional Status Assessment Patient has had a recent decline in their functional status and demonstrates the ability to make significant improvements in function in a reasonable and predictable amount of time.     Precautions / Restrictions Precautions Precautions: Fall;Other (comment) Precaution Comments: CRRT, multiple lines, primofit      Mobility  Bed Mobility Overal bed mobility: Needs Assistance Bed Mobility: Supine to Sit     Supine to sit: Min assist, HOB elevated     General bed mobility comments: HOB 40 degrees with min assist to pivot to EOB, cues for sequence and safety with +2 for line management    Transfers Overall  transfer level: Needs assistance   Transfers: Sit to/from Stand Sit to Stand: Min assist, +2 physical assistance, +2 safety/equipment           General transfer comment: min +2 to stand from bed and from recliner x 2 trials with cues for hand placement needing hand over hand assist to place on chair for transfers. Stand pivot bed to recliner min  +2 assist with RW    Ambulation/Gait Ambulation/Gait assistance: Min guard Gait Distance (Feet): 2 Feet Assistive device: Rolling walker (2 wheels) Gait Pattern/deviations: Step-to pattern   Gait velocity interpretation: <1.8 ft/sec, indicate of risk for recurrent falls   General Gait Details: pt able to step 2' forward and back at chair x 2 trials limited by CRRT and fatigue  Stairs            Wheelchair Mobility     Tilt Bed    Modified Rankin (Stroke Patients Only)       Balance Overall balance assessment: Needs assistance Sitting-balance support: No upper extremity supported, Feet supported Sitting balance-Leahy Scale: Fair     Standing balance support: Bilateral upper extremity supported, During functional activity, Reliant on assistive device for balance Standing balance-Leahy Scale: Poor Standing balance comment: RW in standing                             Pertinent Vitals/Pain Pain Assessment Pain Assessment: No/denies pain    Home Living Family/patient expects to be discharged to:: Private residence Living Arrangements: Other relatives Available Help at Discharge: Family;Available 24 hours/day Type of Home: House Home Access: Level entry       Home  Layout: One level Home Equipment: Agricultural consultant (2 wheels);BSC/3in1;Wheelchair - manual      Prior Function Prior Level of Function : Independent/Modified Independent                     Hand Dominance        Extremity/Trunk Assessment   Upper Extremity Assessment Upper Extremity Assessment: Generalized weakness    Lower  Extremity Assessment Lower Extremity Assessment: Generalized weakness;LLE deficits/detail LLE Deficits / Details: pt with prior left ankle fx with difficulty achieving dorsiflexion    Cervical / Trunk Assessment Cervical / Trunk Assessment: Normal  Communication   Communication: No difficulties  Cognition Arousal/Alertness: Awake/alert Behavior During Therapy: Flat affect Overall Cognitive Status: Impaired/Different from baseline Area of Impairment: Problem solving, Following commands                       Following Commands: Follows one step commands with increased time     Problem Solving: Slow processing          General Comments      Exercises     Assessment/Plan    PT Assessment Patient needs continued PT services  PT Problem List Decreased strength;Decreased activity tolerance;Decreased balance;Decreased mobility;Decreased knowledge of use of DME;Decreased safety awareness;Decreased cognition       PT Treatment Interventions DME instruction;Gait training;Functional mobility training;Therapeutic activities;Patient/family education;Balance training;Therapeutic exercise    PT Goals (Current goals can be found in the Care Plan section)  Acute Rehab PT Goals Patient Stated Goal: return home PT Goal Formulation: With patient Time For Goal Achievement: 11/11/22 Potential to Achieve Goals: Good    Frequency Min 1X/week     Co-evaluation               AM-PAC PT "6 Clicks" Mobility  Outcome Measure Help needed turning from your back to your side while in a flat bed without using bedrails?: A Little Help needed moving from lying on your back to sitting on the side of a flat bed without using bedrails?: A Little Help needed moving to and from a bed to a chair (including a wheelchair)?: A Lot Help needed standing up from a chair using your arms (e.g., wheelchair or bedside chair)?: A Lot Help needed to walk in hospital room?: A Lot Help needed  climbing 3-5 steps with a railing? : Total 6 Click Score: 13    End of Session   Activity Tolerance: Patient tolerated treatment well Patient left: in chair;with call bell/phone within reach;with chair alarm set Nurse Communication: Mobility status PT Visit Diagnosis: Other abnormalities of gait and mobility (R26.89);Muscle weakness (generalized) (M62.81);Difficulty in walking, not elsewhere classified (R26.2)    Time: 1610-9604 PT Time Calculation (min) (ACUTE ONLY): 25 min   Charges:   PT Evaluation $PT Eval High Complexity: 1 High PT Treatments $Therapeutic Activity: 8-22 mins PT General Charges $$ ACUTE PT VISIT: 1 Visit         Merryl Hacker, PT Acute Rehabilitation Services Office: [phone]   Cristine Polio 10/28/2022, 9:36 AM

## 2022-10-28 NOTE — Progress Notes (Signed)
Initial Nutrition Assessment  DOCUMENTATION CODES:   Not applicable  INTERVENTION:   - Liberalize diet to REGULAR with 1200 ml fluid restriction  - Ensure Enlive po TID, each supplement provides 350 kcal and 20 grams of protein  - Renal MVI daily to account for losses with CRRT  NUTRITION DIAGNOSIS:   Increased nutrient needs related to acute illness (AKI on CRRT) as evidenced by estimated needs.  GOAL:   Patient will meet greater than or equal to 90% of their needs  MONITOR:   PO intake, Supplement acceptance, Labs, I & O's, Weight trends  REASON FOR ASSESSMENT:   Consult Assessment of nutrition requirement/status  ASSESSMENT:   72 year old male who presented to the ED on 7/23 with SOB. PMH of T2DM, prostate cancer in remission, PUD, GI bleed, CKD stage IV, GERD, HTN. Pt admitted with septic vs cardiogenic shock, AKI on CKD stage IV.  07/24 - cardiac arrest, intubated, found to have bacteremia and new systolic CHF 07/25 - CRRT start, extubated 07/26 - diet advanced to regnal with 1200 ml fluid restriction  Pt extubated yesterday but remains on CRRT. Diet advanced this morning to renal with 1200 ml fluid restriction. Norovirus in stool.  Spoke with pt and family member at bedside. Pt reports feeling hungry and consuming all of the applesauce that he was provided this morning. Pt's family member reports ordering lunch. Explained increased nutrition needs related to acute illness and CRRT. Pt ans family member express understanding.  Pt reports having a good appetite at home. He typically eats 3 meals daily. Pt denies any recent weight changes. Weight history in chart is limited; current weight is up compared to weight from August 2021.  Pt willing to consume oral nutrition supplements to aid in meeting increased nutrition needs. Will also order daily renal MVI. Will liberalize diet to Regular at this time given pt is on CRRT and is a higher risk for hypokalemia and  hypophosphatemia on CRRT than hyperkalemia and hyperphosphatemia. Once off CRRT, will monitor electrolyte labs and adjust diet order and supplements as appropriate.  Medications reviewed and include: colace, IV solu-cortef, SSI every 4 hours, semglee 5 units daily, IV protonix, miralax, IV abx, levophed @ 8 mcg/min, vasopressin @ 0.04 units/min  Labs reviewed: BUN 80, creatinine 4.67, magnesium 2.5, elevated LFTs, WBC 22.3, hemoglobin 9.1, platelets 38 CBG's: 138-215 x 24 hours  UOP: 0 ml x 24 hours CRRT UF: 2520 ml x 24 hours I/O's: +5.7 L since admit  NUTRITION - FOCUSED PHYSICAL EXAM:  Flowsheet Row Most Recent Value  Orbital Region No depletion  Upper Arm Region No depletion  Thoracic and Lumbar Region No depletion  Buccal Region No depletion  Temple Region Mild depletion  Clavicle Bone Region Mild depletion  Clavicle and Acromion Bone Region Mild depletion  Scapular Bone Region No depletion  Dorsal Hand No depletion  Patellar Region No depletion  Anterior Thigh Region Mild depletion  Posterior Calf Region No depletion  Edema (RD Assessment) Mild  [BUE]  Hair Reviewed  Eyes Reviewed  Mouth Reviewed  Skin Reviewed  Nails Reviewed    Diet Order:   Diet Order             Diet regular Room service appropriate? Yes; Fluid consistency: Thin; Fluid restriction: 1200 mL Fluid  Diet effective now                   EDUCATION NEEDS:   Education needs have been addressed  Skin:  Skin Assessment:  Reviewed RN Assessment  Last BM:  10/26/22  Height:   Ht Readings from Last 1 Encounters:  10/26/22 5\' 8"  (1.727 m)    Weight:   Wt Readings from Last 1 Encounters:  10/28/22 101.1 kg    Ideal Body Weight:  70 kg  BMI:  Body mass index is 33.89 kg/m.  Estimated Nutritional Needs:   Kcal:  2100-2300  Protein:  120-140 grams  Fluid:  >1.8 L    Mertie Clause, MS, RD, LDN Inpatient Clinical Dietitian Please see AMiON for contact information.

## 2022-10-28 NOTE — Progress Notes (Signed)
NAME:  Troy Johnston, MRN:  [id number], DOB:  1951/02/02, LOS: 3 ADMISSION DATE:  10/25/2022, CONSULTATION DATE:  10/28/2022  REFERRING MD:  Eloise Harman, EDP, CHIEF COMPLAINT: Shortness of breath  History of Present Illness:  72 year old diabetic, hypertensive, with CKD baseline creatinine 2.4 brought in by EMS after nephew found him with significant shortness of breath.  He reports symptoms for 3 days, no chest pain or dizziness.  No cough or URI symptoms.  Reports making urine including today. Initial labs significant for BUN/creatinine 91/8.0, bicarbonate 10, elevated LFTs, no leukocytosis, lactate 6.8, hemoglobin 10.4 chronic, platelets 74 K He was placed on nonrebreather initially even though saturation was normal and weaned down to nasal cannula. 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  Patient evaluated bedside this morning. Patient reports that he is having some mid sternal chest pain that comes and goes. He states it is not that bad.   Objective   Blood pressure 137/64, pulse 77, temperature 97.9 F (36.6 C), temperature source Oral, resp. rate (!) 25, height 5\' 8"  (1.727 m), weight 101.1 kg, SpO2 98%.  on 4 L nasal cannula.  Blood pressures with systolics into the 110s, 120s.  Currently on 9 of Levophed, 0.04 vasopressin.        Intake/Output Summary (Last 24 hours) at 10/28/2022  1031 Last data filed at 10/28/2022 1000 Gross per 24 hour  Intake 1567.25 ml  Output 2882.1 ml  Net -1314.85 ml   Filed Weights   10/26/22 0500 10/27/22 0500 10/28/22 0500  Weight: 101.1 kg 101.1 kg 101.1 kg    Examination: General: Resting in bed, in no acute distress, awake, alert HENT: Normocephalic, atraumatic Lungs: Improved breath sounds bilaterally, no wheezes, rhonchi or rales appreciated Cardiovascular: Irregularly irregular Abdomen: Soft, nontender, nondistended, normal active bowel sounds Extremities: No edema appreciated bilaterally Neuro: Awake, alert and oriented x 3, no focal neurological deficits, cranial nerves II through XII intact  Labs reviewed: Hepatic function panel AST 445, ALT 601, magnesium 2.5 Renal function panel sodium 135, potassium 3.9, bicarb 20, creatinine 4.67, colonic showing oxygen saturation 99.9%, White count 22.2, hemoglobin 9.1, platelet 38 Glucose measuring 138-193  GI panel: Norovirus  Blood cultures gorwing  Enterobacter cloacae, sensitivities pending  Resolved Hospital Problem list   Septic shock Type II NSTEMI  Assessment & Plan:   This is a 72 year old male with past medical history of CKD stage IV, hypertension, type 2 diabetes who presents to the emergency department concerns of shortness of breath, and altered mental status.  Patient now with gram-negative rods growing in blood as well as persistent hypotension admitted for septic shock.  Patient also now in atrial fibrillation with RVR now complicated by PEA cardiac arrest   #Cardiogenic shock #Status post PEA arrest #Heart failure with reduced ejection fraction #Atrial fibrillation, rate controlled Patient still currently requiring Levophed as well as vasopressin.  Patient has been weaned off epinephrine.  Patient is alert and  oriented x 3.  Patient has no focal neurological deficits.  Patient remains in atrial fibrillation.  Did try amiodarone yesterday, but did become  bradycardic and hypotensive, so did not continue.  COOX is measuring very well. With the weaning of pressors there was some thought that it COOX would decrease,  which is reassuring. Patient is currently followed by heart failure team who recommends repeat echo in the next week or 2. -Given hemodynamic instability, will hold off on starting GDMT -Continue vasopressin and Levophed, wean to keep systolic goal above 100 -Heart failure team following, appreciate recommendations -Repeat echo in 1-2 weeks -Continue on telemetry -Avoid amiodarone given bradycardia and hypotension -Rate controlled at this time -Continue heparin drip  #Acute hypoxemic respiratory failure likely secondary to above, resolving Patient is weaned down to 2 L of nasal cannula.  Patient denies any shortness of breath. He reports that he is doing well. On exam, patient has clear lung sounds. Patient can continue to wean to room air  -Monitor respiratory status -Wean to room air as tolerated to keep sPo2 goal greater than 92%   #Sepsis #Enterobacter cloacae bacteremia #Norovirus Patient has now improved from septic shock. Pressor requirement at this time is likely  in the setting of cardiogenic shock after patient had PEA arrest.  Patient is currently on day 3 of meropenem.  Symptoms are resolving. -Continue meropenem day 3 -Follow sensitivities -Supportive care for norovirus -Enteric precautions -wean off stress dose steroids   #AKI on CKD stage IV #Anion gap metabolic acidosis Patient is on day 2 of CRRT. Patient is improving from this standpoint. Do have some concern this 2 different insults if patient would get back to his baseline status of kidney function. Will continue CRRT per nephrology and hope that patient starts to make urine -CRRT per nephrology  -Monitor RFP -Monitor for any urine output   #Anemia of critical illness/ chronic diease #Thrombocytopenia Patient has stable hemoglobin at 9.1 this morning.  Platelet count improving. Likely in the setting of sepsis. No acute concerns for bleeding at this time.  -Continue heparin  -Monitor CBC -Monitor for any signs of bleeding   #Elevated liver enzymes, improving  Patient had elevated liver enzymes in the setting of shock liver. Patient has imprioving liver enzxymes this mornig. Will continue to follow.  -Follow-up hepatic function panel  #Hypertension Hypotensive currently.  Home medications include carvedilol 12.5 mg twice daily, hydralazine 100 mg every 8 hours, losartan 100 mg daily -Hold home meds  #Type 2 diabetes mellitus Patient is starting a diet today. It will be a renal diet. Will monitor sugars. So far have bee measuring well. I do anticipate that if patient does start eating more his glucose will start to increase. Home medication does include glipizide 5 mg twice daily.   -Continue on Semglee 5 units daily -Continue sliding scale insulin   #Hyperlipidemia Home medication includes Lipitor 20 mg daily -Given underlying liver injury, will hold at this time  #Costochondritis  Patient did have some reproducible chest pain today on my exam. Will give lidocaine patch. Patient likely sore from CPR. -Lidocaine patch   Best Practice (right click and "Reselect all SmartList Selections" daily)   Diet/type: NPO w/ meds via tube DVT prophylaxis: systemic heparin GI prophylaxis: N/A Lines: N/A Foley:  N/A Code Status:  full code Last date of multidisciplinary goals of care discussion with nephew with Dr. [name] who made the patient a DNR after the events that took place today   Labs  CBC: Recent Labs  Lab 10/25/22 2154 10/26/22 0250 10/26/22 1005 10/26/22 1014 10/26/22 1139 10/27/22 0308 10/27/22 0450 10/28/22 0433  WBC 10.6* 11.4* 9.3  --  11.2* 14.4*  --  22.3*  NEUTROABS 9.4*  --  8.0*  --   --   --   --   --   HGB 9.6* 8.5*  8.8* 8.3* 8.8* 8.4* 9.1* 9.5* 9.1*  HCT 30.8* 26.3*  26.0* 25.5* 26.0* 25.9* 27.3* 28.0*  26.9*  MCV 85.6 81.9 82.3  --  81.2 81.3  --  81.3  PLT 53* 53* 50*  --  49* 35*  --  38*    Basic Metabolic Panel: Recent Labs  Lab 10/26/22 0250 10/26/22 1005 10/26/22 1014 10/26/22 1752 10/27/22 0308 10/27/22 0450 10/27/22 1614 10/28/22 0433  NA 137  136 139   < > 139 136 138 136 135  K 4.1  4.1 3.3*   < > 4.3 4.1 3.9 4.0 3.9  CL 99 98  --  95* 95*  --  99 100  CO2 19* 21*  --  20* 19*  --  20* 20*  GLUCOSE 272* 211*  --  197* 219*  --  200* 163*  BUN 98* 104*  --  111* 122*  --  109* 80*  CREATININE 7.54* 7.89*  --  8.14* 8.45*  --  6.63* 4.67*  CALCIUM 6.8* 8.1*  --  7.9* 7.5*  --  7.0* 7.2*  MG 1.8  --   --   --  2.3  --   --  2.5*  PHOS 6.9*  --   --   --   --   --  5.3* 3.8   < > = values in this interval not displayed.   GFR: Estimated Creatinine Clearance: 16.2 mL/min (A) (by C-G formula based on SCr of 4.67 mg/dL (H)). Recent Labs  Lab 10/25/22 2050 10/25/22 2154 10/26/22 0815 10/26/22 1005 10/26/22 1139 10/26/22 1244 10/26/22 1554 10/27/22 0308 10/27/22 0812 10/28/22 0433  PROCALCITON >150.00  --   --   --   --   --   --   --   --   --   WBC  --    < >  --  9.3 11.2*  --   --  14.4*  --  22.3*  LATICACIDVEN  --    < > 4.1*  --   --  6.3* 5.8*  --  2.7*  --    < > = values in this interval not displayed.    Liver Function Tests: Recent Labs  Lab 10/25/22 1527 10/27/22 0308 10/27/22 1614 10/28/22 0433  AST 163* 906*  --  445*  ALT 59* 646*  --  601*  ALKPHOS 133* 123  --  104  BILITOT 0.8 1.5*  --  1.9*  PROT 6.7 5.6*  --  5.9*  ALBUMIN 2.9* 2.0* 1.9* 2.0*  1.9*   No results for input(s): "LIPASE", "AMYLASE" in the last 168 hours. No results for input(s): "AMMONIA" in the last 168 hours.  ABG    Component Value Date/Time   PHART 7.388 10/27/2022 0450   PCO2ART 33.7 10/27/2022 0450   PO2ART 138 (H) 10/27/2022 0450   HCO3 20.3 10/27/2022 0450   TCO2 21 (L) 10/27/2022 0450   ACIDBASEDEF 4.0 (H) 10/27/2022 0450   O2SAT 99.9 10/28/2022  0433     Coagulation Profile: No results for input(s): "INR", "PROTIME" in the last 168 hours.  Cardiac Enzymes: No results  for input(s): "CKTOTAL", "CKMB", "CKMBINDEX", "TROPONINI" in the last 168 hours.  HbA1C: Hgb A1c MFr Bld  Date/Time Value Ref Range Status  10/26/2022 10:05 AM 7.5 (H) 4.8 - 5.6 % Final    Comment:    (NOTE) Pre diabetes:          5.7%-6.4%  Diabetes:              >6.4%  Glycemic control for   <7.0% adults with diabetes     CBG: Recent Labs  Lab 10/27/22 1534 10/27/22 1942 10/28/22 0008 10/28/22 0405 10/28/22 0757  GLUCAP 193* 160* 153* 138* 139*    Review of Systems:   Constitutional: negative for anorexia, fevers and sweats  Eyes: negative for irritation, redness and visual disturbance  Ears, nose, mouth, throat, and face: negative for earaches, epistaxis, nasal congestion and sore throat  Respiratory: negative for cough,  sputum and wheezing  Cardiovascular: negative for chest pain, orthopnea, palpitations and syncope  Gastrointestinal: negative for abdominal pain, constipation, diarrhea, melena, nausea and vomiting  Genitourinary:negative for dysuria, frequency and hematuria  Hematologic/lymphatic: negative for bleeding, easy bruising and lymphadenopathy  Musculoskeletal:negative for arthralgias, muscle weakness and stiff joints  Neurological: negative for coordination problems, gait problems, headaches and weakness  Endocrine: negative for diabetic symptoms including polydipsia, polyuria and weight loss   Past Medical History:  He,  has a past medical history of Diabetes mellitus without complication (HCC), GERD (gastroesophageal reflux disease), Hypertension, and Prostate cancer (HCC).   Surgical History:  No past surgical history on file.   Social History:   reports that he has never smoked. He has never been exposed to tobacco smoke. He has never used smokeless tobacco. He reports that he does not drink alcohol and does not use  drugs.   Family History:  His family history is not on file.   Allergies Allergies  Allergen Reactions   Ferrous Sulfate Other (See Comments)   Penicillins Hives and Other (See Comments)    Has patient had a PCN reaction causing immediate rash, facial/tongue/throat swelling, SOB or lightheadedness with hypotension: no Has patient had a PCN reaction causing severe rash involving mucus membranes or skin necrosis: no Has patient had a PCN reaction that required hospitalization: yes Has patient had a PCN reaction occurring within the last 10 years: no If all of the above answers are "NO", then may proceed with Cephalosporin use.     Home Medications  Prior to Admission medications   Medication Sig Start Date End Date Taking? Authorizing Provider  carvedilol (COREG) 25 MG tablet Take 25 mg by mouth 2 (two) times daily with a meal. 07/08/22  Yes [provider]  allopurinol (ZYLOPRIM) 100 MG tablet Take 50 mg by mouth every morning. For high uric acid level    [provider]  Alogliptin Benzoate 12.5 MG TABS Take 6.25 mg by mouth every morning. For diabetes (replaces metformin)    [provider]  atorvastatin (LIPITOR) 20 MG tablet Take 20 mg by mouth at bedtime. For cholesterol    [provider]  Carboxymethylcellulose Sodium 0.25 % SOLN Place 1 drop into both eyes 3 (three) times daily.    [provider]  Cholecalciferol (VITAMIN D) 50 MCG (2000 UT) tablet Take 4,000 Units by mouth in the morning.    [provider]  ciprofloxacin (CIPRO) 500 MG tablet Take 1 tablet (500 mg total) by mouth every 12 (twelve) hours. 06/14/20   Arthor Captain, PA-C  ferrous sulfate 325 (65 FE)  MG tablet Take 325 mg by mouth See admin instructions. Take one tablet (325 mg) by mouth three days week - Monday, Wednesday, Thursday    [provider]  glipiZIDE (GLUCOTROL) 10 MG tablet Take 10 mg by mouth 2 (two) times daily before a meal. For diabetes     [provider]  hydrALAZINE (APRESOLINE) 100 MG tablet Take 100 mg by mouth every 8 (eight) hours.    [provider]  lisinopril (ZESTRIL) 10 MG tablet Take 10 mg by mouth in the morning.    [provider]  metroNIDAZOLE (FLAGYL) 500 MG tablet Take 1 tablet (500 mg total) by mouth 2 (two) times daily. 06/14/20   Harris, Cammy Copa, PA-C  oxyCODONE (ROXICODONE) 5 MG immediate release tablet Take 0.5-1 tablets (2.5-5 mg total) by mouth every 6 (six) hours as needed for severe pain. 06/14/20   Arthor Captain, PA-C     Critical care time: 35 mins    Modena Slater Internal medicine resident PGY-2 [phone]  If no response to pager , please call [phone] until 7 pm After 7:00 pm call Elink  [phone]   10/28/2022

## 2022-10-28 NOTE — Procedures (Signed)
I saw and evaluated the patient on CRRT.  I reviewed the last 24 hours events.  Adjustments to CRRT prescription are made as needed.  No changes. No restart once pressor requirements diminish  Filed Weights   10/26/22 0500 10/27/22 0500 10/28/22 0500  Weight: 101.1 kg 101.1 kg 101.1 kg    Recent Labs  Lab 10/28/22 0433  NA 135  K 3.9  CL 100  CO2 20*  GLUCOSE 163*  BUN 80*  CREATININE 4.67*  CALCIUM 7.2*  PHOS 3.8    Recent Labs  Lab 10/25/22 2154 10/26/22 0250 10/26/22 1005 10/26/22 1014 10/26/22 1139 10/27/22 0308 10/27/22 0450 10/28/22 0433  WBC 10.6*   < > 9.3  --  11.2* 14.4*  --  22.3*  NEUTROABS 9.4*  --  8.0*  --   --   --   --   --   HGB 9.6*   < > 8.3*   < > 8.4* 9.1* 9.5* 9.1*  HCT 30.8*   < > 25.5*   < > 25.9* 27.3* 28.0* 26.9*  MCV 85.6   < > 82.3  --  81.2 81.3  --  81.3  PLT 53*   < > 50*  --  49* 35*  --  38*   < > = values in this interval not displayed.    Scheduled Meds:  Chlorhexidine Gluconate Cloth  6 each Topical Daily   docusate  100 mg Per Tube BID   hydrocortisone sod succinate (SOLU-CORTEF) inj  100 mg Intravenous Daily   insulin aspart  0-20 Units Subcutaneous Q4H   insulin glargine-yfgn  5 Units Subcutaneous Daily   pantoprazole (PROTONIX) IV  40 mg Intravenous Q24H   polyethylene glycol  17 g Per Tube Daily   Continuous Infusions:   prismasol BGK 4/2.5 500 mL/hr at 10/28/22 0748    prismasol BGK 4/2.5 300 mL/hr at 10/28/22 2952   sodium chloride 10 mL/hr at 10/28/22 0800   sodium chloride     heparin 1,550 Units/hr (10/28/22 0813)   meropenem (MERREM) IV Stopped (10/28/22 8413)   norepinephrine (LEVOPHED) Adult infusion 8 mcg/min (10/28/22 0800)   prismasol BGK 4/2.5 1,500 mL/hr at 10/28/22 0755   vasopressin 0.04 Units/min (10/28/22 0831)   PRN Meds:.Place/Maintain arterial line **AND** sodium chloride, acetaminophen, docusate sodium, heparin, mouth rinse, polyethylene glycol, sodium chloride   Louie Bun,   MD 10/28/2022, 9:07 AM

## 2022-10-28 NOTE — Progress Notes (Addendum)
ANTICOAGULATION CONSULT NOTE - Follow Up  Pharmacy Consult for Heparin Indication: afib  Allergies  Allergen Reactions   Ferrous Sulfate Other (See Comments)   Penicillins Hives and Other (See Comments)    Has patient had a PCN reaction causing immediate rash, facial/tongue/throat swelling, SOB or lightheadedness with hypotension: no Has patient had a PCN reaction causing severe rash involving mucus membranes or skin necrosis: no Has patient had a PCN reaction that required hospitalization: yes Has patient had a PCN reaction occurring within the last 10 years: no If all of the above answers are "NO", then may proceed with Cephalosporin use.    Patient Measurements: Height: 5\' 8"  (172.7 cm) Weight: 101.1 kg (222 lb 14.2 oz) IBW/kg (Calculated) : 68.4 Heparin Dosing Weight: 88.3 kg  Vital Signs: Temp: 97.9 F (36.6 C) (07/26 0800) Temp Source: Oral (07/26 0800) Pulse Rate: 87 (07/26 0815)  Labs: Recent Labs    10/25/22 1658 10/25/22 2050 10/26/22 1005 10/26/22 1014 10/26/22 1139 10/26/22 1752 10/26/22 2338 10/27/22 0308 10/27/22 0450 10/27/22 0812 10/27/22 1614 10/28/22 0433  HGB  --    < > 8.3*   < > 8.4*  --   --  9.1* 9.5*  --   --  9.1*  HCT  --    < > 25.5*   < > 25.9*  --   --  27.3* 28.0*  --   --  26.9*  PLT  --    < > 50*  --  49*  --   --  35*  --   --   --  38*  HEPARINUNFRC  --    < >  --   --  0.12*  --  0.20*  --   --  0.31  --  0.44  CREATININE  --    < > 7.89*  --   --    < >  --  8.45*  --   --  6.63* 4.67*  TROPONINIHS 2,461*  --  1,284*  --  1,375*  --   --   --   --   --   --   --    < > = values in this interval not displayed.    Estimated Creatinine Clearance: 16.2 mL/min (A) (by C-G formula based on SCr of 4.67 mg/dL (H)).   Medical History: Past Medical History:  Diagnosis Date   Diabetes mellitus without complication (HCC)    GERD (gastroesophageal reflux disease)    Hypertension    Prostate cancer (HCC)     Medications:   Medications Prior to Admission  Medication Sig Dispense Refill Last Dose   allopurinol (ZYLOPRIM) 100 MG tablet Take 50 mg by mouth every other day. For high uric acid level   unk   Alogliptin Benzoate 12.5 MG TABS Take 6.25 mg by mouth every morning. For diabetes (replaces metformin)   unk   atorvastatin (LIPITOR) 20 MG tablet Take 20 mg by mouth at bedtime. For cholesterol   unk   carvedilol (COREG) 25 MG tablet Take 12.5 mg by mouth 2 (two) times daily with a meal.   un   glipiZIDE (GLUCOTROL) 10 MG tablet Take 5 mg by mouth 2 (two) times daily before a meal. For diabetes   unk   hydrALAZINE (APRESOLINE) 100 MG tablet Take 100 mg by mouth every 8 (eight) hours.   unk   losartan (COZAAR) 100 MG tablet Take 100 mg by mouth daily.   unk   Scheduled:  Chlorhexidine Gluconate Cloth  6 each Topical Daily   docusate  100 mg Per Tube BID   hydrocortisone sod succinate (SOLU-CORTEF) inj  100 mg Intravenous Daily   insulin aspart  0-20 Units Subcutaneous Q4H   insulin glargine-yfgn  5 Units Subcutaneous Daily   pantoprazole (PROTONIX) IV  40 mg Intravenous Q24H   polyethylene glycol  17 g Per Tube Daily   Infusions:    prismasol BGK 4/2.5 500 mL/hr at 10/28/22 0748    prismasol BGK 4/2.5 300 mL/hr at 10/28/22 7425   sodium chloride 10 mL/hr at 10/28/22 0800   sodium chloride     heparin 1,550 Units/hr (10/28/22 0813)   meropenem (MERREM) IV Stopped (10/28/22 9563)   norepinephrine (LEVOPHED) Adult infusion 8 mcg/min (10/28/22 0800)   prismasol BGK 4/2.5 1,500 mL/hr at 10/28/22 0755   vasopressin 0.04 Units/min (10/28/22 0831)   PRN: Place/Maintain arterial line **AND** sodium chloride, acetaminophen, docusate sodium, fentaNYL (SUBLIMAZE) injection, fentaNYL (SUBLIMAZE) injection, heparin, midazolam, mouth rinse, polyethylene glycol, sodium chloride  Assessment: 71 yom with history of DM, prostate cancer remission, PUD and GIB presenting with SOB. Heparin per pharmacy consult placed for  chest pain/ACS - planned 48 hrs of treatment for NSTEMI, but now heparin continuing for new afib. Patient is not on anticoagulation PTA.  Heparin held 7/24 AM after cardiac arrest. Cleared by CCM to restart later in the day 7/24. 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 CCM ok to use heparin and monitor platelet trend for now. Hg low but stable. Continuing on CRRT, started 7/25.  AM update - Heparin level remains therapeutic at 0.44. Hg low but stable. Plt up a bit to 38. No bleeding or issues with infusion reported per RN.  Goal of Therapy:  Heparin level 0.3-0.7 units/ml Monitor platelets by anticoagulation protocol: Yes   Plan:  Continue heparin at 1600 units/hr Monitor daily heparin level/CBC, s/sx bleeding   Leia Alf, PharmD, BCPS Please check AMION for all [facility] contact numbers Clinical Pharmacist 10/28/2022 8:41 AM

## 2022-10-28 NOTE — TOC Progression Note (Signed)
Transition of Care [facility]) - Progression Note    Patient Details  Name: Troy Johnston MRN: [id number] Date of Birth: [birth year]  Transition [facility]) CM/SW Contact  Tom-Johnson, Hershal Coria, RN Phone Number: 10/28/2022, 2:09 PM  Clinical Narrative:     CM spoke with patient at bedside about Home Health recommendation. Patient states he has no preference with agencies. CM called in referral to [facility] and Tresa Endo voiced acceptance, info on AVS.  CM called and left a secured voiced message for Mallie Darting [phone] ex [phone]) at the [facility] to return call.   CM will continue to follow as patient progress with care towards discharge.         Expected Discharge Plan: Home/Self Care Barriers to Discharge: Continued Medical Work up  Expected Discharge Plan and Services       Living arrangements for the past 2 months: Single Family Home                                       Social Determinants of Health (SDOH) Interventions SDOH Screenings   Food Insecurity: No Food Insecurity (10/27/2022)  Housing: Low Risk  (10/27/2022)  Transportation Needs: No Transportation Needs (10/27/2022)  Utilities: Not At Risk (10/27/2022)  Alcohol Screen: Low Risk  (10/27/2022)  Financial Resource Strain: Low Risk  (10/27/2022)  Tobacco Use: Low Risk  (10/27/2022)  Health Literacy: Adequate Health Literacy (10/27/2022)    Readmission Risk Interventions     No data to display

## 2022-10-29 DIAGNOSIS — N189 Chronic kidney disease, unspecified: Secondary | ICD-10-CM | POA: Diagnosis not present

## 2022-10-29 DIAGNOSIS — A419 Sepsis, unspecified organism: Secondary | ICD-10-CM | POA: Diagnosis not present

## 2022-10-29 DIAGNOSIS — I469 Cardiac arrest, cause unspecified: Secondary | ICD-10-CM | POA: Diagnosis not present

## 2022-10-29 DIAGNOSIS — N179 Acute kidney failure, unspecified: Secondary | ICD-10-CM | POA: Diagnosis not present

## 2022-10-29 LAB — GLUCOSE, CAPILLARY
Glucose-Capillary: 147 mg/dL — ABNORMAL HIGH (ref 70–99)
Glucose-Capillary: 129 mg/dL — ABNORMAL HIGH (ref 70–99)
Glucose-Capillary: 206 mg/dL — ABNORMAL HIGH (ref 70–99)
Glucose-Capillary: 211 mg/dL — ABNORMAL HIGH (ref 70–99)
Glucose-Capillary: 106 mg/dL — ABNORMAL HIGH (ref 70–99)

## 2022-10-29 LAB — BILIRUBIN, TOTAL: Total Bilirubin: 1.9 mg/dL — ABNORMAL HIGH (ref 0.3–1.2)

## 2022-10-29 LAB — MAGNESIUM: Magnesium: 2.4 mg/dL (ref 1.7–2.4)

## 2022-10-29 LAB — ALKALINE PHOSPHATASE: Alkaline Phosphatase: 129 U/L — ABNORMAL HIGH (ref 38–126)

## 2022-10-29 LAB — AST: AST: 327 U/L — ABNORMAL HIGH (ref 15–41)

## 2022-10-29 LAB — ALT: ALT: 536 U/L — ABNORMAL HIGH (ref 0–44)

## 2022-10-29 LAB — PROTEIN, TOTAL: Total Protein: 5.5 g/dL — ABNORMAL LOW (ref 6.5–8.1)

## 2022-10-29 LAB — BILIRUBIN, DIRECT: Bilirubin, Direct: 0.7 mg/dL — ABNORMAL HIGH (ref 0.0–0.2)

## 2022-10-29 MED ORDER — INSULIN GLARGINE-YFGN 100 UNIT/ML ~~LOC~~ SOLN
10.0000 [IU] | Freq: Every day | SUBCUTANEOUS | Status: DC
  Administered 2022-10-29 – 2022-11-05 (×8): 10 [IU] via SUBCUTANEOUS
  Filled 2022-10-29 (×9): qty 0.1

## 2022-10-29 NOTE — Progress Notes (Signed)
ANTICOAGULATION CONSULT NOTE - Follow Up  Pharmacy Consult for Heparin Indication: afib  Allergies  Allergen Reactions   Ferrous Sulfate Other (See Comments)   Penicillins Hives and Other (See Comments)    Has patient had a PCN reaction causing immediate rash, facial/tongue/throat swelling, SOB or lightheadedness with hypotension: no Has patient had a PCN reaction causing severe rash involving mucus membranes or skin necrosis: no Has patient had a PCN reaction that required hospitalization: yes Has patient had a PCN reaction occurring within the last 10 years: no If all of the above answers are "NO", then may proceed with Cephalosporin use.    Patient Measurements: Height: 5\' 8"  (172.7 cm) Weight: 102.1 kg (225 lb 1.4 oz) IBW/kg (Calculated) : 68.4 Heparin Dosing Weight: 88.3 kg  Vital Signs: Temp: 98 F (36.7 C) (07/27 0801) Temp Source: Oral (07/27 0801) BP: 112/58 (07/27 0801) Pulse Rate: 67 (07/27 0801)  Labs: Recent Labs    10/26/22 1005 10/26/22 1014 10/26/22 1139 10/26/22 1752 10/27/22 0308 10/27/22 0450 10/27/22 0812 10/27/22 1614 10/28/22 0433 10/28/22 1602 10/29/22 0330  HGB 8.3*   < > 8.4*  --  9.1* 9.5*  --   --  9.1*  --  8.1*  HCT 25.5*   < > 25.9*  --  27.3* 28.0*  --   --  26.9*  --  24.2*  PLT 50*  --  49*  --  35*  --   --   --  38*  --  52*  HEPARINUNFRC  --   --  0.12*   < >  --   --  0.31  --  0.44  --  0.47  CREATININE 7.89*  --   --    < > 8.45*  --   --    < > 4.67* 3.71* 3.16*  TROPONINIHS 1,284*  --  1,375*  --   --   --   --   --   --   --   --    < > = values in this interval not displayed.    Estimated Creatinine Clearance: 24.1 mL/min (A) (by C-G formula based on SCr of 3.16 mg/dL (H)).   Medical History: Past Medical History:  Diagnosis Date   Diabetes mellitus without complication (HCC)    GERD (gastroesophageal reflux disease)    Hypertension    Prostate cancer (HCC)     Medications:  Medications Prior to Admission   Medication Sig Dispense Refill Last Dose   allopurinol (ZYLOPRIM) 100 MG tablet Take 50 mg by mouth every other day. For high uric acid level   unk   Alogliptin Benzoate 12.5 MG TABS Take 6.25 mg by mouth every morning. For diabetes (replaces metformin)   unk   atorvastatin (LIPITOR) 20 MG tablet Take 20 mg by mouth at bedtime. For cholesterol   unk   carvedilol (COREG) 25 MG tablet Take 12.5 mg by mouth 2 (two) times daily with a meal.   un   glipiZIDE (GLUCOTROL) 10 MG tablet Take 5 mg by mouth 2 (two) times daily before a meal. For diabetes   unk   hydrALAZINE (APRESOLINE) 100 MG tablet Take 100 mg by mouth every 8 (eight) hours.   unk   losartan (COZAAR) 100 MG tablet Take 100 mg by mouth daily.   unk   Scheduled:   Chlorhexidine Gluconate Cloth  6 each Topical Daily   docusate sodium  100 mg Oral BID   feeding supplement  237 mL Oral  TID BM   insulin aspart  0-20 Units Subcutaneous Q4H   insulin glargine-yfgn  10 Units Subcutaneous Daily   multivitamin  1 tablet Oral QHS   pantoprazole  40 mg Oral QHS   polyethylene glycol  17 g Oral Daily   Infusions:    prismasol BGK 4/2.5 500 mL/hr at 10/29/22 0344    prismasol BGK 4/2.5 300 mL/hr at 10/28/22 2200   sodium chloride 10 mL/hr at 10/29/22 0800   sodium chloride     heparin 1,550 Units/hr (10/29/22 0800)   meropenem (MERREM) IV Stopped (10/29/22 0557)   prismasol BGK 4/2.5 1,500 mL/hr at 10/29/22 0804   PRN: Place/Maintain arterial line **AND** sodium chloride, acetaminophen, docusate sodium, heparin, mouth rinse, polyethylene glycol, sodium chloride  Assessment: 71 yom with history of DM, prostate cancer remission, PUD and GIB presenting with SOB. Heparin per pharmacy consult placed for chest pain/ACS - planned 48 hrs of treatment for NSTEMI, but now heparin continuing for new afib. Patient is not on anticoagulation PTA.  Heparin held 7/24 AM after cardiac arrest. Cleared by CCM to restart later in the day 7/24. Will be  cautious in dosing given low platelets (chronic - 74 on admission >> down to 35 prior to heparin start 7/25). CCM ok to use heparin and monitor platelet trend for now. Hg low but stable. Continuing on CRRT, started 7/25.  AM update - Heparin level remains therapeutic at 0.47. Hg down to 8.1. Plt up to 52. No bleedin issues reported.  Goal of Therapy:  Heparin level 0.3-0.7 units/ml Monitor platelets by anticoagulation protocol: Yes   Plan:  Continue heparin at 1550 units/hr Monitor daily heparin level/CBC, s/sx bleeding   Troy Johnston, PharmD, BCPS Please check AMION for all [facility] contact numbers Clinical Pharmacist 10/29/2022 8:26 AM

## 2022-10-29 NOTE — Progress Notes (Signed)
Nephrology Follow-Up Consult note   Assessment/Recommendations: Troy Johnston is a/an 72 y.o. male with a past medical history significant for HTN, DM2, CKD, admitted for septic shock and enterococcus bacteremia.       AKI on CKD IV:  likely hemodynamically mediated in the setting of shock.              - started CRRT on 7/25  - tolerating well, anuric currently  - negative 1.5 L  -Temporary dialysis catheter in place, appreciate help from CCM  -Can no restart CRRT today  -Reevaluate for dialysis needs daily  -Likely would benefit from line holiday after stopping renal replacement therapy   2.  Shock: septic with enterobacter bacteremia             - abx per primary             - TTE no obvious vegetation              -No longer requiring pressors  -Consider line holiday   3.  Elevated troponins/ NSTEMI/PEA arrest/Afib w/ RVR             - cardiology following             - anticoagulation per primary   4.  Acute hypoxic RF:             - now extubated nad much improved   5.  Elevated LFTs; improving             - most likely d/t tissue ischemia   6.  Thrombocytopenia: stable             - likely in setting of septic shock   7.  Dispo: ICU   Recommendations conveyed to primary service.    Darnell Level  [facility] 10/29/2022 8:07 AM  ___________________________________________________________  CC: SOB  Interval History/Subjective: Patient no longer requiring pressors.  Continues on CRRT with net -1.5 L.  Patient has no complaints   Medications:  Current Facility-Administered Medications  Medication Dose Route Frequency Provider Last Rate Last Admin    prismasol BGK 4/2.5 infusion   CRRT Continuous Darnell Level, MD 500 mL/hr at 10/29/22 0344 New Bag at 10/29/22 0344    prismasol BGK 4/2.5 infusion   CRRT Continuous Darnell Level, MD 300 mL/hr at 10/28/22 2200 New Bag at 10/28/22 2200   0.9 %  sodium chloride infusion  250 mL  Intravenous Continuous Cyril Mourning V, MD 10 mL/hr at 10/29/22 0700 Infusion Verify at 10/29/22 0700   0.9 %  sodium chloride infusion   Intra-arterial PRN Paliwal, Eliezer Lofts, MD       acetaminophen (TYLENOL) tablet 650 mg  650 mg Oral Q4H PRN Oretha Milch, MD       Chlorhexidine Gluconate Cloth 2 % PADS 6 each  6 each Topical Daily Oretha Milch, MD   6 each at 10/28/22 2300   docusate sodium (COLACE) capsule 100 mg  100 mg Oral BID PRN Oretha Milch, MD       docusate sodium (COLACE) capsule 100 mg  100 mg Oral BID Cyril Mourning V, MD   100 mg at 10/28/22 2300   feeding supplement (ENSURE ENLIVE / ENSURE PLUS) liquid 237 mL  237 mL Oral TID BM Cyril Mourning V, MD   237 mL at 10/28/22 1441   heparin ADULT infusion 100 units/mL (25000 units/222mL)  1,550 Units/hr Intravenous Continuous von Dohlen, Haley B, RPH 15.5  mL/hr at 10/29/22 0700 1,550 Units/hr at 10/29/22 0700   heparin injection 1,000-6,000 Units  1,000-6,000 Units CRRT PRN Darnell Level, MD       insulin aspart (novoLOG) injection 0-20 Units  0-20 Units Subcutaneous Q4H Modena Slater, DO   3 Units at 10/29/22 0804   insulin glargine-yfgn (SEMGLEE) injection 10 Units  10 Units Subcutaneous Daily Oretha Milch, MD       meropenem (MERREM) 1 g in sodium chloride 0.9 % 100 mL IVPB  1 g Intravenous Q8H von Pearletha Furl, RPH   Stopped at 10/29/22 0557   multivitamin (RENA-VIT) tablet 1 tablet  1 tablet Oral QHS Oretha Milch, MD   1 tablet at 10/28/22 2300   Oral care mouth rinse  15 mL Mouth Rinse PRN Oretha Milch, MD       pantoprazole (PROTONIX) EC tablet 40 mg  40 mg Oral QHS Oretha Milch, MD       polyethylene glycol (MIRALAX / GLYCOLAX) packet 17 g  17 g Oral Daily PRN Oretha Milch, MD       polyethylene glycol (MIRALAX / GLYCOLAX) packet 17 g  17 g Oral Daily Oretha Milch, MD       prismasol BGK 4/2.5 infusion   CRRT Continuous Darnell Level, MD 1,500 mL/hr at 10/29/22 0804 New Bag at 10/29/22 0804   sodium chloride  0.9 % primer fluid for CRRT   CRRT PRN Darnell Level, MD          Review of Systems: 12 systems reviewed and negative except per HPI  Physical Exam: Vitals:   10/29/22 0730 10/29/22 0801  BP: 104/60   Pulse: 66   Resp: 17   Temp:  98 F (36.7 C)  SpO2: 98%    No intake/output data recorded.  Intake/Output Summary (Last 24 hours) at 10/29/2022 1914 Last data filed at 10/29/2022 0700 Gross per 24 hour  Intake 1584.31 ml  Output 2956.4 ml  Net -1372.09 ml   Constitutional: Ill-appearing lying in bed, awake, alert ENMT: ears and nose without scars or lesions, MMM CV: normal rate, trace edema in the ankles Respiratory: no iwob, bilateral chest rise Gastrointestinal: soft, non-tender, no palpable masses or hernias Skin: no visible lesions or rashes Neuro: awake, alert, answers questions easily   Test Results I personally reviewed new and old clinical labs and radiology tests Lab Results  Component Value Date   NA 132 (L) 10/29/2022   K 3.6 10/29/2022   CL 99 10/29/2022   CO2 24 10/29/2022   BUN 54 (H) 10/29/2022   CREATININE 3.16 (H) 10/29/2022   CALCIUM 7.1 (L) 10/29/2022   ALBUMIN 1.9 (L) 10/29/2022   PHOS 2.0 (L) 10/29/2022    CBC Recent Labs  Lab 10/25/22 2154 10/26/22 0250 10/26/22 1005 10/26/22 1014 10/27/22 0308 10/27/22 0450 10/28/22 0433 10/29/22 0330  WBC 10.6*   < > 9.3   < > 14.4*  --  22.3* 23.8*  NEUTROABS 9.4*  --  8.0*  --   --   --   --   --   HGB 9.6*   < > 8.3*   < > 9.1* 9.5* 9.1* 8.1*  HCT 30.8*   < > 25.5*   < > 27.3* 28.0* 26.9* 24.2*  MCV 85.6   < > 82.3   < > 81.3  --  81.3 79.3*  PLT 53*   < > 50*   < > 35*  --  38* 52*   < > = values in this interval not displayed.

## 2022-10-29 NOTE — Plan of Care (Signed)
Patient is in sinus rhythm. Can change heparin to eliquis when feasible. Otherwise, cardiology will sign off. We will schedule FU

## 2022-10-29 NOTE — Procedures (Signed)
I saw and evaluated the patient on CRRT.  I reviewed the last 24 hours events.  Adjustments to CRRT prescription are made as needed.  No changes. No restart today  Filed Weights   10/27/22 0500 10/28/22 0500 10/29/22 0500  Weight: 101.1 kg 101.1 kg 102.1 kg    Recent Labs  Lab 10/29/22 0330  NA 132*  K 3.6  CL 99  CO2 24  GLUCOSE 159*  BUN 54*  CREATININE 3.16*  CALCIUM 7.1*  PHOS 2.0*    Recent Labs  Lab 10/25/22 2154 10/26/22 0250 10/26/22 1005 10/26/22 1014 10/27/22 0308 10/27/22 0450 10/28/22 0433 10/29/22 0330  WBC 10.6*   < > 9.3   < > 14.4*  --  22.3* 23.8*  NEUTROABS 9.4*  --  8.0*  --   --   --   --   --   HGB 9.6*   < > 8.3*   < > 9.1* 9.5* 9.1* 8.1*  HCT 30.8*   < > 25.5*   < > 27.3* 28.0* 26.9* 24.2*  MCV 85.6   < > 82.3   < > 81.3  --  81.3 79.3*  PLT 53*   < > 50*   < > 35*  --  38* 52*   < > = values in this interval not displayed.    Scheduled Meds:  Chlorhexidine Gluconate Cloth  6 each Topical Daily   docusate sodium  100 mg Oral BID   feeding supplement  237 mL Oral TID BM   insulin aspart  0-20 Units Subcutaneous Q4H   insulin glargine-yfgn  10 Units Subcutaneous Daily   multivitamin  1 tablet Oral QHS   pantoprazole  40 mg Oral QHS   polyethylene glycol  17 g Oral Daily   Continuous Infusions:   prismasol BGK 4/2.5 500 mL/hr at 10/29/22 0344    prismasol BGK 4/2.5 300 mL/hr at 10/28/22 2200   sodium chloride 10 mL/hr at 10/29/22 0800   sodium chloride     heparin 1,550 Units/hr (10/29/22 0800)   meropenem (MERREM) IV Stopped (10/29/22 0557)   prismasol BGK 4/2.5 1,500 mL/hr at 10/29/22 0804   PRN Meds:.Place/Maintain arterial line **AND** sodium chloride, acetaminophen, docusate sodium, heparin, mouth rinse, polyethylene glycol, sodium chloride   Louie Bun,  MD 10/29/2022, 8:09 AM

## 2022-10-29 NOTE — Progress Notes (Signed)
NAME:  Troy Johnston, MRN:  [id number], DOB:  06/16/1950, LOS: 4 ADMISSION DATE:  10/25/2022, CONSULTATION DATE:  10/29/2022  REFERRING MD:  Eloise Harman, EDP, CHIEF COMPLAINT: Shortness of breath  History of Present Illness:  72 year old diabetic, hypertensive, with CKD baseline creatinine 2.4 brought in by EMS after nephew found him with significant shortness of breath.  He reports symptoms for 3 days, no chest pain or dizziness.  No cough or URI symptoms.  Reports making urine including today. Initial labs significant for BUN/creatinine 91/8.0, bicarbonate 10, elevated LFTs, no leukocytosis, lactate 6.8, hemoglobin 10.4 chronic, platelets 74 K He was placed on nonrebreather initially even though saturation was normal and weaned down to nasal cannula. Peripheral Levophed started point initial blood pressure of 80/44, with improvement on 2 mics Venous pH was 7.32 Troponin was elevated at 2879 EKG did not show any ST-T wave changes or changes of hyperkalemia   Pertinent  Medical History  Diabetes - 2 Hypertension CKD stage IV, baseline creatinine 2.4 07/2022 Prostate cancer  Significant Hospital Events: Including procedures, antibiotic start and stop dates in addition to other pertinent events   07/23: Admission to ICU 07/24: AF-RVR >> amio gtt ,Placement of HD cath, PEA arrest ,intubated ,ROSC after 10 minutes , EF low at 20% 07/25: Patient extubated, started CRRT , hypotensive and given amiodarone again 7/26 weaned off Levophed  Interim History / Subjective:   Critically ill, improving, remains on CRRT Off pressors On room air   Objective   Blood pressure (!) 90/52, pulse 64, temperature 98.6 F (37 C), temperature source Oral, resp. rate 16, height 5\' 8"  (1.727 m), weight 102.1 kg, SpO2 97%.  on 4 L nasal cannula.  Blood pressures with systolics into the 110s, 120s.  Currently on 9 of Levophed, 0.04 vasopressin.        Intake/Output Summary (Last 24 hours) at 10/29/2022  0743 Last data filed at 10/29/2022 0700 Gross per 24 hour  Intake 1628.72 ml  Output 3114.2 ml  Net -1485.48 ml   Filed Weights   10/27/22 0500 10/28/22 0500 10/29/22 0500  Weight: 101.1 kg 101.1 kg 102.1 kg    Examination: General: Resting in bed, in no acute distress, awake, alert HENT: Normocephalic, atraumatic Lungs: No accessory muscle use, clear breath sounds bilateral Cardiovascular: Irregularly irregular Abdomen: Soft, nontender, nondistended, normal active bowel sounds Extremities: No edema appreciated bilaterally Neuro: Awake, interactive, nonfocal  Labs reviewed: Mild hyponatremia, decreasing LFTs, BUN/creatinine decreased to 54/3.1, stable leukocytosis, drop in hemoglobin from 9.1-8.1, slight improvement in thrombocytopenia  GI panel: Norovirus  Blood cultures gorwing  Enterobacter cloacae, intermediate to [facility] Problem list   Septic shock Type II NSTEMI #Acute hypoxemic respiratory failure   Assessment & Plan:   This is a 72 year old male with past medical history of CKD stage IV, hypertension, type 2 diabetes who presents to the emergency department concerns of shortness of breath, and altered mental status.  Patient now with gram-negative rods growing in blood as well as persistent hypotension admitted for septic shock.  Patient also now in atrial fibrillation with RVR now complicated by PEA cardiac arrest after started on amnio  #Cardiogenic shock #Status post PEA arrest #Heart failure with reduced ejection fraction #Atrial fibrillation, rate controlled -Off pressors, SCV O2 normal so does not need inotropes -Heart failure team following, can start GDMT now that blood pressure improved -will need hydralazine nitrates and lieu of home losartan -Repeat echo in 1-2 weeks -Continue on telemetry -Avoid amiodarone given bradycardia  and hypotension -Continue heparin drip    #Sepsis #Enterobacter cloacae bacteremia #Norovirus -Continue  meropenem day 4/7 --Enteric precautions for norovirus  -dc  stress dose steroids   #AKI on CKD stage IV #Anion gap metabolic acidosis Patient is on  CRRT.  -Can stop today now that off pressors and see if there is renal recovery, guarded prognosis -Monitor RFP -Monitor for any urine output   #Anemia of critical illness/ chronic diease #Thrombocytopenia, improving  Likely in the setting of sepsis. No acute concerns for bleeding at this time.  -Continue heparin  -Monitor CBC -Monitor for any signs of bleeding   #Elevated liver enzymes, improving  Due to shock liver.  -Follow-up hepatic function panel  #Hypertension  Home medications include carvedilol 12.5 mg twice daily, hydralazine 100 mg every 8 hours, losartan 100 mg daily -Hold home meds until blood pressure improves  #Type 2 diabetes mellitus, uncontrolled  Home medication does include glipizide 5 mg twice daily.   -Increase Semglee 10 units daily -Continue sliding scale insulin   #Hyperlipidemia Home medication includes Lipitor 20 mg daily -Given underlying liver injury, will hold at this time  #Costochondritis  Patient did have some reproducible chest pain today on my exam. Will give lidocaine patch. Patient likely sore from CPR. -Lidocaine patch   Best Practice (right click and "Reselect all SmartList Selections" daily)   Diet/type: NPO w/ meds via tube DVT prophylaxis: systemic heparin GI prophylaxis: N/A Lines: N/A Foley:  N/A Code Status:  full code Last date of multidisciplinary goals of care discussion with nephew CJ 7/25 -DNR/no CPR issued, will need to revisit now that he has had recovery  Labs   CBC: Recent Labs  Lab 10/25/22 2154 10/26/22 0250 10/26/22 1005 10/26/22 1014 10/26/22 1139 10/27/22 0308 10/27/22 0450 10/28/22 0433 10/29/22 0330  WBC 10.6*   < > 9.3  --  11.2* 14.4*  --  22.3* 23.8*  NEUTROABS 9.4*  --  8.0*  --   --   --   --   --   --   HGB 9.6*   < > 8.3*   < > 8.4* 9.1*  9.5* 9.1* 8.1*  HCT 30.8*   < > 25.5*   < > 25.9* 27.3* 28.0* 26.9* 24.2*  MCV 85.6   < > 82.3  --  81.2 81.3  --  81.3 79.3*  PLT 53*   < > 50*  --  49* 35*  --  38* 52*   < > = values in this interval not displayed.    Basic Metabolic Panel: Recent Labs  Lab 10/26/22 0250 10/26/22 1005 10/27/22 0308 10/27/22 0450 10/27/22 1614 10/28/22 0433 10/28/22 1602 10/29/22 0330  NA 137  136   < > 136 138 136 135 136 132*  K 4.1  4.1   < > 4.1 3.9 4.0 3.9 3.9 3.6  CL 99   < > 95*  --  99 100 100 99  CO2 19*   < > 19*  --  20* 20* 21* 24  GLUCOSE 272*   < > 219*  --  200* 163* 213* 159*  BUN 98*   < > 122*  --  109* 80* 68* 54*  CREATININE 7.54*   < > 8.45*  --  6.63* 4.67* 3.71* 3.16*  CALCIUM 6.8*   < > 7.5*  --  7.0* 7.2* 7.4* 7.1*  MG 1.8  --  2.3  --   --  2.5*  --  2.4  PHOS  6.9*  --   --   --  5.3* 3.8 2.9 2.0*   < > = values in this interval not displayed.   GFR: Estimated Creatinine Clearance: 24.1 mL/min (A) (by C-G formula based on SCr of 3.16 mg/dL (H)). Recent Labs  Lab 10/25/22 2050 10/25/22 2154 10/26/22 0815 10/26/22 1005 10/26/22 1139 10/26/22 1244 10/26/22 1554 10/27/22 0308 10/27/22 0812 10/28/22 0433 10/29/22 0330  PROCALCITON >150.00  --   --   --   --   --   --   --   --   --   --   WBC  --    < >  --    < > 11.2*  --   --  14.4*  --  22.3* 23.8*  LATICACIDVEN  --    < > 4.1*  --   --  6.3* 5.8*  --  2.7*  --   --    < > = values in this interval not displayed.    Liver Function Tests: Recent Labs  Lab 10/25/22 1527 10/27/22 0308 10/27/22 1614 10/28/22 0433 10/28/22 1602 10/29/22 0330  AST 163* 906*  --  445*  --  327*  ALT 59* 646*  --  601*  --  536*  ALKPHOS 133* 123  --  104  --  129*  BILITOT 0.8 1.5*  --  1.9*  --  1.9*  PROT 6.7 5.6*  --  5.9*  --  5.5*  ALBUMIN 2.9* 2.0* 1.9* 2.0*  1.9* 2.2* 1.9*   No results for input(s): "LIPASE", "AMYLASE" in the last 168 hours. No results for input(s): "AMMONIA" in the last 168  hours.  ABG    Component Value Date/Time   PHART 7.388 10/27/2022 0450   PCO2ART 33.7 10/27/2022 0450   PO2ART 138 (H) 10/27/2022 0450   HCO3 20.3 10/27/2022 0450   TCO2 21 (L) 10/27/2022 0450   ACIDBASEDEF 4.0 (H) 10/27/2022 0450   O2SAT 70.5 10/29/2022 0330     Coagulation Profile: No results for input(s): "INR", "PROTIME" in the last 168 hours.  Cardiac Enzymes: No results for input(s): "CKTOTAL", "CKMB", "CKMBINDEX", "TROPONINI" in the last 168 hours.  HbA1C: Hgb A1c MFr Bld  Date/Time Value Ref Range Status  10/26/2022 10:05 AM 7.5 (H) 4.8 - 5.6 % Final    Comment:    (NOTE) Pre diabetes:          5.7%-6.4%  Diabetes:              >6.4%  Glycemic control for   <7.0% adults with diabetes     CBG: Recent Labs  Lab 10/28/22 1229 10/28/22 1551 10/28/22 2019 10/28/22 2335 10/29/22 0339  GLUCAP 150* 183* 161* 203* 147*    Critical care time: 32 mins    Cyril Mourning MD. FCCP. [facility] Pager : [phone]  If no response to pager , please call [phone] until 7 pm After 7:00 pm call Elink  [phone]   10/29/2022

## 2022-10-29 NOTE — Plan of Care (Signed)

## 2022-10-29 NOTE — Plan of Care (Signed)
progressing 

## 2022-10-30 DIAGNOSIS — N179 Acute kidney failure, unspecified: Secondary | ICD-10-CM | POA: Diagnosis not present

## 2022-10-30 DIAGNOSIS — N189 Chronic kidney disease, unspecified: Secondary | ICD-10-CM | POA: Diagnosis not present

## 2022-10-30 DIAGNOSIS — A419 Sepsis, unspecified organism: Secondary | ICD-10-CM | POA: Diagnosis not present

## 2022-10-30 DIAGNOSIS — R6521 Severe sepsis with septic shock: Secondary | ICD-10-CM | POA: Diagnosis not present

## 2022-10-30 LAB — RENAL FUNCTION PANEL
Albumin: 1.8 g/dL — ABNORMAL LOW (ref 3.5–5.0)
Anion gap: 11 (ref 5–15)
BUN: 68 mg/dL — ABNORMAL HIGH (ref 8–23)
CO2: 22 mmol/L (ref 22–32)
Calcium: 7.3 mg/dL — ABNORMAL LOW (ref 8.9–10.3)
Chloride: 103 mmol/L (ref 98–111)
Creatinine, Ser: 3.93 mg/dL — ABNORMAL HIGH (ref 0.61–1.24)
GFR, Estimated: 15 mL/min — ABNORMAL LOW (ref >60)
Glucose, Bld: 122 mg/dL — ABNORMAL HIGH (ref 70–99)
Phosphorus: 3.5 mg/dL (ref 2.5–4.6)
Potassium: 3.6 mmol/L (ref 3.5–5.1)
Sodium: 136 mmol/L (ref 135–145)

## 2022-10-30 LAB — CBC
HCT: 28.1 % — ABNORMAL LOW (ref 39.0–52.0)
Hemoglobin: 9.1 g/dL — ABNORMAL LOW (ref 13.0–17.0)
MCH: 26.2 pg (ref 26.0–34.0)
MCHC: 32.4 g/dL (ref 30.0–36.0)
MCV: 81 fL (ref 80.0–100.0)
Platelets: 88 10*3/uL — ABNORMAL LOW (ref 150–400)
RBC: 3.47 MIL/uL — ABNORMAL LOW (ref 4.22–5.81)
RDW: 13.6 % (ref 11.5–15.5)
WBC: 19 10*3/uL — ABNORMAL HIGH (ref 4.0–10.5)
nRBC: 0.9 % — ABNORMAL HIGH (ref 0.0–0.2)

## 2022-10-30 LAB — GLUCOSE, CAPILLARY
Glucose-Capillary: 125 mg/dL — ABNORMAL HIGH (ref 70–99)
Glucose-Capillary: 114 mg/dL — ABNORMAL HIGH (ref 70–99)
Glucose-Capillary: 159 mg/dL — ABNORMAL HIGH (ref 70–99)
Glucose-Capillary: 154 mg/dL — ABNORMAL HIGH (ref 70–99)
Glucose-Capillary: 112 mg/dL — ABNORMAL HIGH (ref 70–99)

## 2022-10-30 LAB — COOXEMETRY PANEL
Carboxyhemoglobin: 2.6 % — ABNORMAL HIGH (ref 0.5–1.5)
Methemoglobin: <0.7 % (ref 0.0–1.5)
O2 Saturation: 70.4 %
Total hemoglobin: 9.5 g/dL — ABNORMAL LOW (ref 12.0–16.0)

## 2022-10-30 LAB — HEPARIN LEVEL (UNFRACTIONATED): Heparin Unfractionated: 0.37 [IU]/mL (ref 0.30–0.70)

## 2022-10-30 LAB — MAGNESIUM: Magnesium: 2.4 mg/dL (ref 1.7–2.4)

## 2022-10-30 MED ORDER — INSULIN ASPART 100 UNIT/ML IJ SOLN
0.0000 [IU] | Freq: Three times a day (TID) | INTRAMUSCULAR | Status: DC
  Administered 2022-10-30 – 2022-10-31 (×4): 3 [IU] via SUBCUTANEOUS
  Administered 2022-10-31 – 2022-11-01 (×3): 2 [IU] via SUBCUTANEOUS
  Administered 2022-11-02 – 2022-11-03 (×2): 3 [IU] via SUBCUTANEOUS
  Administered 2022-11-03: 2 [IU] via SUBCUTANEOUS
  Administered 2022-11-05: 3 [IU] via SUBCUTANEOUS
  Administered 2022-11-07 – 2022-11-08 (×2): 2 [IU] via SUBCUTANEOUS
  Administered 2022-11-08: 3 [IU] via SUBCUTANEOUS

## 2022-10-30 MED ORDER — FUROSEMIDE 10 MG/ML IJ SOLN
120.0000 mg | Freq: Once | INTRAVENOUS | Status: AC
  Administered 2022-10-30: 120 mg via INTRAVENOUS
  Filled 2022-10-30: qty 10

## 2022-10-30 MED ORDER — INSULIN ASPART 100 UNIT/ML IJ SOLN: 0.0000 [IU] | Freq: Every day | INTRAMUSCULAR | Status: DC

## 2022-10-30 MED ORDER — ALBUMIN HUMAN 25 % IV SOLN
25.0000 g | Freq: Once | INTRAVENOUS | Status: AC
  Administered 2022-10-30: 25 g via INTRAVENOUS
  Filled 2022-10-30: qty 100

## 2022-10-30 MED ORDER — SODIUM CHLORIDE 0.9 % IV SOLN
1.0000 g | INTRAVENOUS | Status: AC
  Administered 2022-10-31 – 2022-11-01 (×2): 1 g via INTRAVENOUS
  Filled 2022-10-30 (×2): qty 20

## 2022-10-30 NOTE — Progress Notes (Signed)
NAME:  Troy Johnston, MRN:  [id number], DOB:  1951-02-19, LOS: 5 ADMISSION DATE:  10/25/2022, CONSULTATION DATE:  10/30/2022  REFERRING MD:  Eloise Harman, EDP, CHIEF COMPLAINT: Shortness of breath  History of Present Illness:  72 year old diabetic, hypertensive, with CKD baseline creatinine 2.4 brought in by EMS after nephew found him with significant shortness of breath.  He reports symptoms for 3 days, no chest pain or dizziness.  No cough or URI symptoms.  Reports making urine including today. Initial labs significant for BUN/creatinine 91/8.0, bicarbonate 10, elevated LFTs, no leukocytosis, lactate 6.8, hemoglobin 10.4 chronic, platelets 74 K He was placed on nonrebreather initially even though saturation was normal and weaned down to nasal cannula. 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 rate 19, height 5\' 8"  (1.727 m), weight 101.7 kg, SpO2 100%.  on 4 L nasal cannula.  Blood pressures with systolics into the 110s, 120s.  Currently on 9 of Levophed, 0.04 vasopressin.        Intake/Output Summary (Last 24 hours) at 10/30/2022 0851 Last data  filed at 10/30/2022 0800 Gross per 24 hour  Intake 1277.59 ml  Output 1454 ml  Net -176.41 ml   Filed Weights   10/28/22 0500 10/29/22 0500 10/30/22 0500  Weight: 101.1 kg 102.1 kg 101.7 kg    Examination: General: Resting in bed, in no acute distress, awake, alert HENT: Normocephalic, atraumatic Lungs: Clear breath sounds bilateral, no accessory muscle use Cardiovascular: S1-S2 regular, sinus on monitor Abdomen: Soft, nontender, nondistended, normal active bowel sounds Extremities: No edema appreciated bilaterally Neuro: Awake, interactive, nonfocal  Labs reviewed: Improved thrombocytopenia, decreasing leukocytosis, mild hypokalemia, BUN/creatinine rising to 68/3.9  GI panel: Norovirus  Blood cultures -Enterobacter cloacae, intermediate to [facility] Problem list   Septic shock Type II NSTEMI #Cardiogenic shock #Acute hypoxemic respiratory failure   Assessment & Plan:   This is a 72 year old male with past medical history of CKD stage IV, hypertension, type 2 diabetes who presents to the emergency department concerns of shortness of breath, and altered mental status.  Patient now with gram-negative rods growing in blood as well as persistent hypotension admitted for septic shock.  Patient also now in atrial fibrillation with RVR complicated by PEA cardiac arrest after started on amio   #Status post PEA arrest #Heart failure with reduced ejection fraction  -Off pressors, SCV O2 normal  -Heart failure team was following, can start GDMT now that blood pressure improved -will need hydralazine nitrates and lieu of home losartan -holding off due to soft blood pressure and need for HD -Repeat echo in 1-2 weeks -Continue on telemetry  #Atrial fibrillation, new onset -reverted back to sinus rhythm  7/27 -Avoid amiodarone given bradycardia and hypotension -Continue heparin drip in case permacath required  #Sepsis #Enterobacter cloacae  bacteremia #Norovirus -Continue meropenem day 5/7 --Enteric precautions for norovirus   #AKI on CKD stage IV #Anion gap metabolic acidosis Patient is on  CRRT.  -No signs of renal recovery, guarded prognosis, may need IHD tomorrow -Monitor RFP -Monitor for any urine output   #Anemia of critical illness/ chronic diease #Thrombocytopenia, improving  Likely in the setting of sepsis. No acute concerns for bleeding at this time.  -Continue heparin  -Monitor CBC   #Elevated liver enzymes, improving  Due to shock liver.  -Follow-up hepatic function panel intermittently  #Hypertension  Home medications include carvedilol 12.5 mg twice daily, hydralazine 100 mg every 8 hours, losartan 100 mg daily -Hold home meds until blood pressure improves  #Type 2 diabetes mellitus, better controlled  Home medication does include glipizide 5 mg twice daily.   - continue Semglee 10 units daily -Continue sliding scale insulin , reduce to moderate scale  #Hyperlipidemia Home medication includes Lipitor 20 mg daily -Given underlying liver injury, will hold at this time  #Costochondritis  Patient did have some reproducible chest pain today on my exam. Will give lidocaine patch. Patient likely sore from CPR. -Lidocaine patch   Can transfer out of ICU unless renal wants to keep in for first hemodialysis session  Best Practice (right click and "Reselect all SmartList Selections" daily)   Diet/type: Regular consistency (see orders) DVT prophylaxis: systemic heparin GI prophylaxis: N/A Lines: N/A Foley:  N/A Code Status:  full code Last date of multidisciplinary goals of care discussion with nephew CJ 7/25 -DNR/no CPR issued, will need to revisit now that he has had recovery  Labs   CBC: Recent Labs  Lab 10/25/22 2154 10/26/22 0250 10/26/22 1005 10/26/22 1014 10/26/22 1139 10/27/22 0308 10/27/22 0450 10/28/22 0433 10/29/22 0330 10/30/22 0500  WBC 10.6*   < > 9.3  --  11.2* 14.4*   --  22.3* 23.8* 19.0*  NEUTROABS 9.4*  --  8.0*  --   --   --   --   --   --   --   HGB 9.6*   < > 8.3*   < > 8.4* 9.1* 9.5* 9.1* 8.1* 9.1*  HCT 30.8*   < > 25.5*   < > 25.9* 27.3* 28.0* 26.9* 24.2* 28.1*  MCV 85.6   < > 82.3  --  81.2 81.3  --  81.3 79.3* 81.0  PLT 53*   < > 50*  --  49* 35*  --  38* 52* 88*   < > = values in this interval not displayed.    Basic Metabolic Panel: Recent Labs  Lab 10/26/22 0250 10/26/22 1005 10/27/22 0308 10/27/22 0450 10/28/22 0433 10/28/22 1602 10/29/22 0330 10/29/22 1648 10/30/22 0500  NA 137  136   < > 136   < > 135 136 132* 133* 136  K 4.1  4.1   < > 4.1   < > 3.9 3.9 3.6 3.2* 3.6  CL 99   < > 95*   < > 100 100 99 101 103  CO2 19*   < > 19*   < > 20* 21* 24 24 22   GLUCOSE 272*   < > 219*   < > 163* 213* 159* 173* 122*  BUN 98*   < > 122*   < > 80* 68* 54* 55* 68*  CREATININE 7.54*   < > 8.45*   < >  4.67* 3.71* 3.16* 3.24* 3.93*  CALCIUM 6.8*   < > 7.5*   < > 7.2* 7.4* 7.1* 7.1* 7.3*  MG 1.8  --  2.3  --  2.5*  --  2.4  --  2.4  PHOS 6.9*  --   --    < > 3.8 2.9 2.0* 2.2* 3.5   < > = values in this interval not displayed.   GFR: Estimated Creatinine Clearance: 19.3 mL/min (A) (by C-G formula based on SCr of 3.93 mg/dL (H)). Recent Labs  Lab 10/25/22 2050 10/25/22 2154 10/26/22 0815 10/26/22 1005 10/26/22 1244 10/26/22 1554 10/27/22 0308 10/27/22 0812 10/28/22 0433 10/29/22 0330 10/30/22 0500  PROCALCITON >150.00  --   --   --   --   --   --   --   --   --   --   WBC  --    < >  --    < >  --   --  14.4*  --  22.3* 23.8* 19.0*  LATICACIDVEN  --    < > 4.1*  --  6.3* 5.8*  --  2.7*  --   --   --    < > = values in this interval not displayed.    Liver Function Tests: Recent Labs  Lab 10/25/22 1527 10/27/22 0308 10/27/22 1614 10/28/22 0433 10/28/22 1602 10/29/22 0330 10/29/22 1648 10/30/22 0500  AST 163* 906*  --  445*  --  327*  --   --   ALT 59* 646*  --  601*  --  536*  --   --   ALKPHOS 133* 123  --  104  --   129*  --   --   BILITOT 0.8 1.5*  --  1.9*  --  1.9*  --   --   PROT 6.7 5.6*  --  5.9*  --  5.5*  --   --   ALBUMIN 2.9* 2.0*   < > 2.0*  1.9* 2.2* 1.9* 2.0* 1.8*   < > = values in this interval not displayed.   No results for input(s): "LIPASE", "AMYLASE" in the last 168 hours. No results for input(s): "AMMONIA" in the last 168 hours.  ABG    Component Value Date/Time   PHART 7.388 10/27/2022 0450   PCO2ART 33.7 10/27/2022 0450   PO2ART 138 (H) 10/27/2022 0450   HCO3 20.3 10/27/2022 0450   TCO2 21 (L) 10/27/2022 0450   ACIDBASEDEF 4.0 (H) 10/27/2022 0450   O2SAT 70.4 10/30/2022 0555     Coagulation Profile: No results for input(s): "INR", "PROTIME" in the last 168 hours.  Cardiac Enzymes: No results for input(s): "CKTOTAL", "CKMB", "CKMBINDEX", "TROPONINI" in the last 168 hours.  HbA1C: Hgb A1c MFr Bld  Date/Time Value Ref Range Status  10/26/2022 10:05 AM 7.5 (H) 4.8 - 5.6 % Final    Comment:    (NOTE) Pre diabetes:          5.7%-6.4%  Diabetes:              >6.4%  Glycemic control for   <7.0% adults with diabetes     CBG: Recent Labs  Lab 10/29/22 1115 10/29/22 1537 10/29/22 2320 10/30/22 0312 10/30/22 0803  GLUCAP 206* 211* 106* 125* 114*     Cyril Mourning MD. FCCP. [facility] Pager : [phone]  If no response to pager , please call [phone] until 7 pm After 7:00 pm call Elink  [phone]   10/30/2022    

## 2022-10-30 NOTE — Progress Notes (Signed)
PHARMACY NOTE:  ANTIMICROBIAL RENAL DOSAGE ADJUSTMENT  Current antimicrobial regimen includes a mismatch between antimicrobial dosage and estimated renal function.  As per policy approved by the Pharmacy & Therapeutics and Medical Executive Committees, the antimicrobial dosage will be adjusted accordingly.  Current antimicrobial dosage:  meropenem 1g IV q8h  Indication: enterobacter cloacae bacteremia  Renal Function:  Estimated Creatinine Clearance: 19.3 mL/min (A) (by C-G formula based on SCr of 3.93 mg/dL (H)). [x]      On intermittent HD, scheduled: []      On CRRT    Antimicrobial dosage has been changed to:  meropenem 1g IV q24h  Additional comments: stop date in for 7/30 per CCM   Leia Alf, PharmD, BCPS Please check AMION for all [facility] contact numbers Clinical Pharmacist 10/30/2022 8:43 AM

## 2022-10-30 NOTE — Progress Notes (Signed)
Nephrology Follow-Up Consult note   Assessment/Recommendations: Troy Johnston is a/an 72 y.o. male with a past medical history significant for HTN, DM2, CKD, admitted for septic shock and enterococcus bacteremia.       AKI on CKD IV:  likely hemodynamically mediated in the setting of shock.              - started CRRT on 7/25 and stopped on 7/27  -Temporary dialysis catheter in place, appreciate help from CCM  -IV Lasix trial today w/ albumin given serum level 1.8  -Consider intermittent hemodialysis tomorrow  -Likely would benefit from line holiday after stopping renal replacement therapy   2.  Shock: septic with enterobacter bacteremia             - abx per primary             - TTE no obvious vegetation              -No longer requiring pressors  -Consider line holiday   3.  Elevated troponins/ NSTEMI/PEA arrest/Afib w/ RVR             - cardiology following             - anticoagulation per primary   4.  Acute hypoxic RF:             - now extubated and much improved   5.  Elevated LFTs; improving             - most likely d/t tissue ischemia   6.  Thrombocytopenia: stable             - likely in setting of septic shock    Recommendations conveyed to primary service.    Darnell Level [facility] 10/30/2022 8:29 AM  ___________________________________________________________  CC: SOB  Interval History/Subjective: Patient is well today with no complaints.  Urine output increased to 450 cc made.  He denies any complaints.   Medications:  Current Facility-Administered Medications  Medication Dose Route Frequency Provider Last Rate Last Admin   0.9 %  sodium chloride infusion  250 mL Intravenous Continuous Oretha Milch, MD 10 mL/hr at 10/30/22 0800 Infusion Verify at 10/30/22 0800   0.9 %  sodium chloride infusion   Intra-arterial PRN Paliwal, Aditya, MD       acetaminophen (TYLENOL) tablet 650 mg  650 mg Oral Q4H PRN Oretha Milch, MD        Chlorhexidine Gluconate Cloth 2 % PADS 6 each  6 each Topical Daily Oretha Milch, MD   6 each at 10/29/22 2110   docusate sodium (COLACE) capsule 100 mg  100 mg Oral BID PRN Oretha Milch, MD       docusate sodium (COLACE) capsule 100 mg  100 mg Oral BID Cyril Mourning V, MD   100 mg at 10/29/22 2102   feeding supplement (ENSURE ENLIVE / ENSURE PLUS) liquid 237 mL  237 mL Oral TID BM Cyril Mourning V, MD   237 mL at 10/29/22 1400   heparin ADULT infusion 100 units/mL (25000 units/249mL)  1,550 Units/hr Intravenous Continuous von Dohlen, Haley B, RPH 15.5 mL/hr at 10/30/22 0800 1,550 Units/hr at 10/30/22 0800   heparin injection 1,000-6,000 Units  1,000-6,000 Units CRRT PRN Darnell Level, MD   2,400 Units at 10/29/22 1457   insulin aspart (novoLOG) injection 0-20 Units  0-20 Units Subcutaneous Q4H Modena Slater, DO   3 Units at 10/30/22 [phone]  insulin glargine-yfgn (SEMGLEE) injection 10 Units  10 Units Subcutaneous Daily Oretha Milch, MD   10 Units at 10/29/22 0936   meropenem (MERREM) 1 g in sodium chloride 0.9 % 100 mL IVPB  1 g Intravenous Q8H Oretha Milch, MD   Stopped at 10/30/22 0601   multivitamin (RENA-VIT) tablet 1 tablet  1 tablet Oral QHS Oretha Milch, MD   1 tablet at 10/29/22 2102   Oral care mouth rinse  15 mL Mouth Rinse PRN Oretha Milch, MD       pantoprazole (PROTONIX) EC tablet 40 mg  40 mg Oral QHS Oretha Milch, MD   40 mg at 10/29/22 2102   polyethylene glycol (MIRALAX / GLYCOLAX) packet 17 g  17 g Oral Daily PRN Oretha Milch, MD       polyethylene glycol (MIRALAX / GLYCOLAX) packet 17 g  17 g Oral Daily Oretha Milch, MD   17 g at 10/29/22 1102   sodium chloride 0.9 % primer fluid for CRRT   CRRT PRN Darnell Level, MD          Review of Systems: 12 systems reviewed and negative except per HPI  Physical Exam: Vitals:   10/30/22 0600 10/30/22 0700  BP: (!) 95/42 (!) 110/50  Pulse: 69 69  Resp: 17 19  Temp:    SpO2: 100% 100%   Total I/O In: 25.5  [I.V.:25.5] Out: -   Intake/Output Summary (Last 24 hours) at 10/30/2022 5621 Last data filed at 10/30/2022 0800 Gross per 24 hour  Intake 1397.59 ml  Output 1454 ml  Net -56.41 ml   Constitutional: Lying in bed in no distress, awake, alert ENMT: ears and nose without scars or lesions, MMM CV: normal rate, 1+ edema in the ankles Respiratory: no iwob, bilateral chest rise Gastrointestinal: soft, non-tender, no palpable masses or hernias Skin: no visible lesions or rashes Neuro: awake, alert, answers questions easily   Test Results I personally reviewed new and old clinical labs and radiology tests Lab Results  Component Value Date   NA 136 10/30/2022   K 3.6 10/30/2022   CL 103 10/30/2022   CO2 22 10/30/2022   BUN 68 (H) 10/30/2022   CREATININE 3.93 (H) 10/30/2022   CALCIUM 7.3 (L) 10/30/2022   ALBUMIN 1.8 (L) 10/30/2022   PHOS 3.5 10/30/2022    CBC Recent Labs  Lab 10/25/22 2154 10/26/22 0250 10/26/22 1005 10/26/22 1014 10/28/22 0433 10/29/22 0330 10/30/22 0500  WBC 10.6*   < > 9.3   < > 22.3* 23.8* 19.0*  NEUTROABS 9.4*  --  8.0*  --   --   --   --   HGB 9.6*   < > 8.3*   < > 9.1* 8.1* 9.1*  HCT 30.8*   < > 25.5*   < > 26.9* 24.2* 28.1*  MCV 85.6   < > 82.3   < > 81.3 79.3* 81.0  PLT 53*   < > 50*   < > 38* 52* 88*   < > = values in this interval not displayed.

## 2022-10-30 NOTE — Progress Notes (Signed)
ANTICOAGULATION CONSULT NOTE - Follow Up  Pharmacy Consult for Heparin Indication: afib  Allergies  Allergen Reactions   Ferrous Sulfate Other (See Comments)   Penicillins Hives and Other (See Comments)    Has patient had a PCN reaction causing immediate rash, facial/tongue/throat swelling, SOB or lightheadedness with hypotension: no Has patient had a PCN reaction causing severe rash involving mucus membranes or skin necrosis: no Has patient had a PCN reaction that required hospitalization: yes Has patient had a PCN reaction occurring within the last 10 years: no If all of the above answers are "NO", then may proceed with Cephalosporin use.    Patient Measurements: Height: 5\' 8"  (172.7 cm) Weight: 101.7 kg (224 lb 3.3 oz) IBW/kg (Calculated) : 68.4 Heparin Dosing Weight: 88.3 kg  Vital Signs: Temp: 98.3 F (36.8 C) (07/28 0400) Temp Source: Oral (07/28 0400) BP: 110/50 (07/28 0700) Pulse Rate: 69 (07/28 0700)  Labs: Recent Labs    10/28/22 0433 10/28/22 1602 10/29/22 0330 10/29/22 1648 10/30/22 0500  HGB 9.1*  --  8.1*  --  9.1*  HCT 26.9*  --  24.2*  --  28.1*  PLT 38*  --  52*  --  88*  HEPARINUNFRC 0.44  --  0.47  --  0.37  CREATININE 4.67*   < > 3.16* 3.24* 3.93*   < > = values in this interval not displayed.    Estimated Creatinine Clearance: 19.3 mL/min (A) (by C-G formula based on SCr of 3.93 mg/dL (H)).   Medical History: Past Medical History:  Diagnosis Date   Diabetes mellitus without complication (HCC)    GERD (gastroesophageal reflux disease)    Hypertension    Prostate cancer (HCC)     Medications:  Medications Prior to Admission  Medication Sig Dispense Refill Last Dose   allopurinol (ZYLOPRIM) 100 MG tablet Take 50 mg by mouth every other day. For high uric acid level   unk   Alogliptin Benzoate 12.5 MG TABS Take 6.25 mg by mouth every morning. For diabetes (replaces metformin)   unk   atorvastatin (LIPITOR) 20 MG tablet Take 20 mg by mouth  at bedtime. For cholesterol   unk   carvedilol (COREG) 25 MG tablet Take 12.5 mg by mouth 2 (two) times daily with a meal.   un   glipiZIDE (GLUCOTROL) 10 MG tablet Take 5 mg by mouth 2 (two) times daily before a meal. For diabetes   unk   hydrALAZINE (APRESOLINE) 100 MG tablet Take 100 mg by mouth every 8 (eight) hours.   unk   losartan (COZAAR) 100 MG tablet Take 100 mg by mouth daily.   unk   Scheduled:   Chlorhexidine Gluconate Cloth  6 each Topical Daily   docusate sodium  100 mg Oral BID   feeding supplement  237 mL Oral TID BM   insulin aspart  0-20 Units Subcutaneous Q4H   insulin glargine-yfgn  10 Units Subcutaneous Daily   multivitamin  1 tablet Oral QHS   pantoprazole  40 mg Oral QHS   polyethylene glycol  17 g Oral Daily   Infusions:   sodium chloride 10 mL/hr at 10/30/22 0800   sodium chloride     heparin 1,550 Units/hr (10/30/22 0800)   meropenem (MERREM) IV Stopped (10/30/22 0601)   PRN: Place/Maintain arterial line **AND** sodium chloride, acetaminophen, docusate sodium, heparin, mouth rinse, polyethylene glycol, sodium chloride  Assessment: 71 yom with history of DM, prostate cancer remission, PUD and GIB presenting with SOB. Heparin per pharmacy  consult placed for chest pain/ACS - planned 48 hrs of treatment for NSTEMI, but now heparin continuing for new afib. Patient is not on anticoagulation PTA.  Heparin held 7/24 AM after cardiac arrest. Cleared by CCM to restart later in the day 7/24. 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 CCM ok to use heparin and monitor platelet trend for now. Hg low but stable. S/p CRRT 7/25-7/27.  AM update - Heparin level remains therapeutic at 0.37. Hg low but stable. Plt up to 88. No bleeding issues reported.  Goal of Therapy:  Heparin level 0.3-0.7 units/ml Monitor platelets by anticoagulation protocol: Yes   Plan:  Continue heparin at 1550  units/hr Monitor daily heparin level/CBC, s/sx bleeding F/u transition to apixaban as appropriate   Leia Alf, PharmD, BCPS Please check AMION for all [facility] contact numbers Clinical Pharmacist 10/30/2022 8:22 AM

## 2022-10-30 NOTE — Evaluation (Signed)
Occupational Therapy Evaluation Patient Details Name: Troy Johnston MRN: [id number] DOB: 07/25/1950 Today's Date: 10/30/2022   History of Present Illness 72 yo male admitted 7/23 with SOB and shock. 7/24 PEA arrest with ROSC after 10 min ACLS, NSTEMI. Intubated 7/24-7/25. 7/25 - 7/27 CRRT. PMhx: T2DM, CKD, HTN, prostate CA   Clinical Impression   [name] was evaluated s/p the above admission list. He is indep at baseline. Upon evaluation the pt was limited by generalized weakness, flat affect, slowed processing, decreased activity tolerance and unsteady gait. Overall he needed min G for transfers and short distance mobility with the RW. Overall he tolerated about 5 minutes of standing activity prior to needing a sitting rest break. Due to the deficits listed below the pt also needs up to mod A for LB ADLS and set up A for UB ADLs with cues for sequencing higher level tasks. Pt will benefit from continued acute OT services and HHOT as pt has great family support.        Recommendations for follow up therapy are one component of a multi-disciplinary discharge planning process, led by the attending physician.  Recommendations may be updated based on patient status, additional functional criteria and insurance authorization.   Assistance Recommended at Discharge Frequent or constant Supervision/Assistance  Patient can return home with the following A little help with walking and/or transfers;A little help with bathing/dressing/bathroom;Assistance with cooking/housework;Direct supervision/assist for medications management;Direct supervision/assist for financial management;Assist for transportation;Help with stairs or ramp for entrance    Functional Status Assessment  Patient has had a recent decline in their functional status and demonstrates the ability to make significant improvements in function in a reasonable and predictable amount of time.  Equipment Recommendations  Tub/shower seat        Precautions / Restrictions Precautions Precautions: Fall;Other (comment) Precaution Comments: entreric Restrictions Weight Bearing Restrictions: No      Mobility Bed Mobility Overal bed mobility: Needs Assistance             General bed mobility comments: OOB upon arrival    Transfers Overall transfer level: Needs assistance Equipment used: Rolling walker (2 wheels) Transfers: Sit to/from Stand Sit to Stand: Min guard           General transfer comment: min G for STS 2x. Ambulated from chair to sink with close guard      Balance Overall balance assessment: Needs assistance Sitting-balance support: No upper extremity supported, Feet supported Sitting balance-Leahy Scale: Fair     Standing balance support: Single extremity supported, During functional activity Standing balance-Leahy Scale: Fair Standing balance comment: statically standing at the sink during grooming. no overt LOB                           ADL either performed or assessed with clinical judgement   ADL Overall ADL's : Needs assistance/impaired Eating/Feeding: Independent   Grooming: Min guard;Standing Grooming Details (indicate cue type and reason): standing at the sink ~4 minutes Upper Body Bathing: Set up;Sitting   Lower Body Bathing: Moderate assistance;Sit to/from stand   Upper Body Dressing : Set up;Sitting   Lower Body Dressing: Moderate assistance;Sit to/from stand   Toilet Transfer: Min guard;Ambulation;Rolling walker (2 wheels)   Toileting- Clothing Manipulation and Hygiene: Min guard;Sitting/lateral lean       Functional mobility during ADLs: Min guard;Cueing for safety;Cueing for sequencing;Rolling walker (2 wheels) General ADL Comments: limited by activity tolerance, generalized weakness and unsteady gait  Vision Baseline Vision/History: 1 Wears glasses Vision Assessment?: No apparent visual deficits     Perception Perception Perception Tested?: No    Praxis Praxis Praxis tested?: Not tested    Pertinent Vitals/Pain Pain Assessment Pain Assessment: Faces Faces Pain Scale: No hurt Pain Intervention(s): Monitored during session     Hand Dominance     Extremity/Trunk Assessment Upper Extremity Assessment Upper Extremity Assessment: Generalized weakness   Lower Extremity Assessment Lower Extremity Assessment: Defer to PT evaluation   Cervical / Trunk Assessment Cervical / Trunk Assessment: Normal   Communication Communication Communication: No difficulties   Cognition Arousal/Alertness: Awake/alert Behavior During Therapy: Flat affect Overall Cognitive Status: Impaired/Different from baseline Area of Impairment: Attention, Following commands, Safety/judgement, Awareness, Problem solving                   Current Attention Level: Sustained   Following Commands: Follows one step commands consistently Safety/Judgement: Decreased awareness of deficits, Decreased awareness of safety Awareness: Emergent Problem Solving: Slow processing General Comments: oriented and following simple commands, needed cues for higher level tasks.     General Comments  VSS on RA, family present            Home Living Family/patient expects to be discharged to:: Private residence Living Arrangements: Other relatives Available Help at Discharge: Family;Available 24 hours/day Type of Home: House Home Access: Level entry     Home Layout: One level     Bathroom Shower/Tub: Producer, television/film/video: Standard     Home Equipment: Agricultural consultant (2 wheels);BSC/3in1;Wheelchair - manual          Prior Functioning/Environment Prior Level of Function : Independent/Modified Independent                        OT Problem List: Decreased strength;Decreased range of motion;Decreased activity tolerance;Impaired balance (sitting and/or standing);Decreased safety awareness;Decreased knowledge of use of DME or  AE;Decreased knowledge of precautions      OT Treatment/Interventions: Self-care/ADL training;Therapeutic exercise;DME and/or AE instruction;Therapeutic activities;Patient/family education;Balance training    OT Goals(Current goals can be found in the care plan section) Acute Rehab OT Goals Patient Stated Goal: home OT Goal Formulation: With patient Time For Goal Achievement: 11/13/22 Potential to Achieve Goals: Good ADL Goals Pt Will Perform Grooming: with modified independence;standing Pt Will Perform Lower Body Dressing: with min guard assist;sit to/from stand Pt Will Transfer to Toilet: with supervision;ambulating Pt Will Perform Toileting - Clothing Manipulation and hygiene: with modified independence;sitting/lateral leans;sit to/from stand Additional ADL Goal #1: pt will tolerate at least 10 minutes of OOB standing activity to demonstrate increased tolerance fro safe d/c home  OT Frequency: Min 2X/week       AM-PAC OT "6 Clicks" Daily Activity     Outcome Measure Help from another person eating meals?: None Help from another person taking care of personal grooming?: A Little Help from another person toileting, which includes using toliet, bedpan, or urinal?: A Little Help from another person bathing (including washing, rinsing, drying)?: A Lot Help from another person to put on and taking off regular upper body clothing?: A Little Help from another person to put on and taking off regular lower body clothing?: A Lot 6 Click Score: 17   End of Session Equipment Utilized During Treatment: Gait belt;Rolling walker (2 wheels) Nurse Communication: Mobility status  Activity Tolerance: Patient tolerated treatment well Patient left: in chair;with call bell/phone within reach;with family/visitor present  OT Visit Diagnosis: Unsteadiness on  feet (R26.81);Other abnormalities of gait and mobility (R26.89);Muscle weakness (generalized) (M62.81)                Time: 1610-9604 OT Time  Calculation (min): 22 min Charges:  OT General Charges $OT Visit: 1 Visit OT Evaluation $OT Eval Moderate Complexity: 1 Mod  Derenda Mis, OTR/L Acute Rehabilitation Services Office [phone] Secure Chat Communication Preferred   Donia Pounds 10/30/2022, 2:48 PM

## 2022-10-31 ENCOUNTER — Other Ambulatory Visit (HOSPITAL_COMMUNITY): Payer: Self-pay

## 2022-10-31 ENCOUNTER — Inpatient Hospital Stay (HOSPITAL_COMMUNITY): Payer: No Typology Code available for payment source

## 2022-10-31 DIAGNOSIS — N179 Acute kidney failure, unspecified: Secondary | ICD-10-CM | POA: Diagnosis not present

## 2022-10-31 DIAGNOSIS — N189 Chronic kidney disease, unspecified: Secondary | ICD-10-CM | POA: Diagnosis not present

## 2022-10-31 DIAGNOSIS — I5021 Acute systolic (congestive) heart failure: Secondary | ICD-10-CM

## 2022-10-31 LAB — ECHOCARDIOGRAM LIMITED
Height: 68 in
S' Lateral: 4.8 cm
Weight: 3527.36 oz

## 2022-10-31 LAB — GLUCOSE, CAPILLARY
Glucose-Capillary: 136 mg/dL — ABNORMAL HIGH (ref 70–99)
Glucose-Capillary: 150 mg/dL — ABNORMAL HIGH (ref 70–99)
Glucose-Capillary: 152 mg/dL — ABNORMAL HIGH (ref 70–99)
Glucose-Capillary: 153 mg/dL — ABNORMAL HIGH (ref 70–99)
Glucose-Capillary: 171 mg/dL — ABNORMAL HIGH (ref 70–99)
Glucose-Capillary: 172 mg/dL — ABNORMAL HIGH (ref 70–99)
Glucose-Capillary: 173 mg/dL — ABNORMAL HIGH (ref 70–99)
Glucose-Capillary: 187 mg/dL — ABNORMAL HIGH (ref 70–99)
Glucose-Capillary: 189 mg/dL — ABNORMAL HIGH (ref 70–99)

## 2022-10-31 MED ORDER — POTASSIUM CHLORIDE 20 MEQ PO PACK
20.0000 meq | PACK | Freq: Once | ORAL | Status: AC
  Administered 2022-10-31: 20 meq via ORAL
  Filled 2022-10-31: qty 1

## 2022-10-31 MED ORDER — HEPARIN (PORCINE) 25000 UT/250ML-% IV SOLN
1600.0000 [IU]/h | INTRAVENOUS | Status: DC
  Administered 2022-11-01 – 2022-11-05 (×6): 1600 [IU]/h via INTRAVENOUS
  Filled 2022-10-31 (×7): qty 250

## 2022-10-31 MED ORDER — FUROSEMIDE 10 MG/ML IJ SOLN
120.0000 mg | Freq: Once | INTRAVENOUS | Status: AC
  Administered 2022-10-31: 120 mg via INTRAVENOUS
  Filled 2022-10-31: qty 10

## 2022-10-31 MED ORDER — ALBUMIN HUMAN 25 % IV SOLN
25.0000 g | Freq: Once | INTRAVENOUS | Status: AC
  Administered 2022-10-31: 25 g via INTRAVENOUS
  Filled 2022-10-31: qty 100

## 2022-10-31 MED ORDER — PERFLUTREN LIPID MICROSPHERE
1.0000 mL | INTRAVENOUS | Status: AC | PRN
  Administered 2022-10-31: 4 mL via INTRAVENOUS

## 2022-10-31 NOTE — Plan of Care (Signed)
  Problem: Education: Goal: Knowledge of General Education information will improve Description: Including pain rating scale, medication(s)/side effects and non-pharmacologic comfort measures Outcome: Progressing   Problem: Health Behavior/Discharge Planning: Goal: Ability to manage health-related needs will improve Outcome: Progressing   Problem: Clinical Measurements: Goal: Ability to maintain clinical measurements within normal limits will improve Outcome: Progressing Goal: Diagnostic test results will improve Outcome: Progressing Goal: Respiratory complications will improve Outcome: Progressing Goal: Cardiovascular complication will be avoided Outcome: Progressing   Problem: Activity: Goal: Risk for activity intolerance will decrease Outcome: Progressing   Problem: Nutrition: Goal: Adequate nutrition will be maintained Outcome: Progressing   Problem: Coping: Goal: Level of anxiety will decrease Outcome: Progressing   Problem: Elimination: Goal: Will not experience complications related to bowel motility Outcome: Progressing Goal: Will not experience complications related to urinary retention Outcome: Progressing   Problem: Pain Managment: Goal: General experience of comfort will improve Outcome: Progressing   

## 2022-10-31 NOTE — Progress Notes (Signed)
Advanced Heart Failure Rounding Note  PCP-Cardiologist: None   Subjective:    Extubated 7/25 Afib >> SR 07/26 CRRT stopped 07/27  Off all pressors.  Diuresed > 3L with IV lasix yesterday. Scr 3.97.  Sitting up in chair. Ambulated around the unit with PT  Objective:   Weight Range: 100 kg Body mass index is 33.52 kg/m.   Vital Signs:   Temp:  [98.3 F (36.8 C)-98.7 F (37.1 C)] 98.4 F (36.9 C) (07/29 0810) Pulse Rate:  [63-76] 68 (07/29 0900) Resp:  [14-23] 22 (07/29 0900) BP: (68-145)/(37-68) 113/47 (07/29 0900) SpO2:  [95 %-100 %] 100 % (07/29 0900) Weight:  [100 kg] 100 kg (07/29 0500) Last BM Date : 10/26/22  Weight change: Filed Weights   10/29/22 0500 10/30/22 0500 10/31/22 0500  Weight: 102.1 kg 101.7 kg 100 kg    Intake/Output:   Intake/Output Summary (Last 24 hours) at 10/31/2022 1009 Last data filed at 10/31/2022 0800 Gross per 24 hour  Intake 622.48 ml  Output 3275 ml  Net -2652.52 ml      Physical Exam    General:  Sitting up in chair.  HEENT: normal Neck: supple. JVP 6-7. R internal jugular HD cath. Carotids 2+ bilat; no bruits.  Cor: PMI nondisplaced. Regular rate & rhythm. No rubs, gallops or murmurs. Lungs: clear Abdomen: soft, nontender, nondistended.  Extremities: no cyanosis, clubbing, rash, edema Neuro: alert & orientedx3. Affect pleasant     Telemetry   SR 60s  Labs    CBC Recent Labs    10/30/22 0500 10/31/22 0448  WBC 19.0* 16.6*  HGB 9.1* 8.3*  HCT 28.1* 25.9*  MCV 81.0 82.2  PLT 88* 106*   Basic Metabolic Panel Recent Labs    40/98/11 0500 10/31/22 0447 10/31/22 0448  NA 136 135  --   K 3.6 3.3*  --   CL 103 101  --   CO2 22 23  --   GLUCOSE 122* 192*  --   BUN 68* 81*  --   CREATININE 3.93* 3.97*  --   CALCIUM 7.3* 7.5*  --   MG 2.4  --  2.4  PHOS 3.5 4.2  --    Liver Function Tests Recent Labs    10/29/22 0330 10/29/22 1648 10/30/22 0500 10/31/22 0447  AST 327*  --   --   --   ALT  536*  --   --   --   ALKPHOS 129*  --   --   --   BILITOT 1.9*  --   --   --   PROT 5.5*  --   --   --   ALBUMIN 1.9*   < > 1.8* 2.1*   < > = values in this interval not displayed.   No results for input(s): "LIPASE", "AMYLASE" in the last 72 hours. Cardiac Enzymes No results for input(s): "CKTOTAL", "CKMB", "CKMBINDEX", "TROPONINI" in the last 72 hours.  BNP: BNP (last 3 results) Recent Labs    10/25/22 1528  BNP 719.5*    ProBNP (last 3 results) No results for input(s): "PROBNP" in the last 8760 hours.   D-Dimer No results for input(s): "DDIMER" in the last 72 hours.  Hemoglobin A1C No results for input(s): "HGBA1C" in the last 72 hours.  Fasting Lipid Panel No results for input(s): "CHOL", "HDL", "LDLCALC", "TRIG", "CHOLHDL", "LDLDIRECT" in the last 72 hours. Thyroid Function Tests No results for input(s): "TSH", "T4TOTAL", "T3FREE", "THYROIDAB" in the last 72 hours.  Invalid  input(s): "FREET3"  Other results:   Imaging    No results found.   Medications:     Scheduled Medications:  Chlorhexidine Gluconate Cloth  6 each Topical Daily   docusate sodium  100 mg Oral BID   feeding supplement  237 mL Oral TID BM   insulin aspart  0-15 Units Subcutaneous TID WC   insulin aspart  0-5 Units Subcutaneous QHS   insulin glargine-yfgn  10 Units Subcutaneous Daily   multivitamin  1 tablet Oral QHS   pantoprazole  40 mg Oral QHS   polyethylene glycol  17 g Oral Daily   potassium chloride  20 mEq Oral Once    Infusions:  sodium chloride Stopped (10/30/22 1501)   sodium chloride     albumin human     furosemide     heparin 1,550 Units/hr (10/31/22 0800)   meropenem (MERREM) IV 200 mL/hr at 10/31/22 0800    PRN Medications: Place/Maintain arterial line **AND** sodium chloride, acetaminophen, docusate sodium, mouth rinse, polyethylene glycol    Patient Profile   72 y.o. male with history of DM II, HTN, CKD IIIb/IV, prostate cancer. Admitted with shock,  metabolic acidosis and AKI on CKD.    Suffered cardiac arrest and found to have bacteremia and new systolic CHF. Advanced Heart Failure asked to see to assist with managing shock.  Assessment/Plan   Shock -Likely septic + ? 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  -BC + for Enterobacter cloacae. Now on meropenem per CCM. Norovirus in stool. -Echo: EF 20-25%, moderate LVH, RV severely reduced -HS troponin 2,879>2,461>1,284, flat trend. Suspect demand ischemia but does have risk factors for CAD -Improving. Off pressors.  2. 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 Good UOP with IV lasix.  -Recheck echo to reassess LV function -Will decide on GDMT pending echo results. Will need to be cautious not to drop BP while waiting for renal recovery  3. Cardiac arrest: -07/23-Became bradycardic and had PEA arrest, CPR with ROSC after 10 minutes. Then had Vfib s/p defibrillation >>Afib.   4. Elevated troponin -> Type II NSTEMI -Peaked at 2,879, flat trend -Suspect demand ischemia as above -Family reports he had not been complaining of any chest pain   5. AKI on CKD IV Metabolic acidosis -Scr baseline around 2.4, peaked at 8.7. AKI in setting of shock. -Nephrology following -CRRT stopped 07/27. Awaiting renal recovery  6. Atrial fibrillation  -New -Failed IV amiodarone due to hypotension - Converted to SR on 07/26 -On heparin gtt   7. Elevated LFTs -Likely shock liver -Improving   8. Acute hypoxic respiratory failure -Extubated 7/25   9. DM II -A1c 7.5% -Per primary   10. Anemia History of iron deficiency -Hgb 15.6>>>8.3 -No obvious source of bleeding -T sat 7%, consider IV iron once infection treated   11. Thrombocytopenia - D/t septic shock -Platelets down to 35K, improved to 106K today     Length  of Stay: 6  Troy Skoczylas N, PA-C  10/31/2022, 10:09 AM  Advanced Heart Failure Team Pager [phone] (M-F; 7a - 5p)  Please contact [facility] for night-coverage after hours (5p -7a ) and weekends on [email or web address]

## 2022-10-31 NOTE — Progress Notes (Signed)
Physical Therapy Treatment Patient Details Name: Troy Johnston MRN: [id number] DOB: December 28, 1950 Today's Date: 10/31/2022   History of Present Illness 72 yo male admitted 7/23 with SOB and shock. 7/24 PEA arrest with ROSC after 10 min ACLS, NSTEMI. Intubated 7/24-7/25. 7/25 - 7/27 CRRT. PMhx: T2DM, CKD, HTN, prostate CA    PT Comments  Pt pleasant and eager to walk. Pt able to walk unit with RW and left drop foot which is baseline. Pt tolerating well with SPO2 100% on RA and HR 76. Further mobility limited by breakfast arrival. Plan remains appropriate and will continue to follow.     If plan is discharge home, recommend the following: A little help with walking and/or transfers;A little help with bathing/dressing/bathroom;Assistance with cooking/housework;Assist for transportation   Can travel by private vehicle        Equipment Recommendations  None recommended by PT    Recommendations for Other Services       Precautions / Restrictions Precautions Precautions: Fall Precaution Comments: enteric     Mobility  Bed Mobility Overal bed mobility: Modified Independent                  Transfers Overall transfer level: Needs assistance   Transfers: Sit to/from Stand Sit to Stand: Min guard           General transfer comment: cues for hand placement and safety    Ambulation/Gait Ambulation/Gait assistance: Min guard Gait Distance (Feet): 150 Feet Assistive device: Rolling walker (2 wheels) Gait Pattern/deviations: Step-to pattern   Gait velocity interpretation: 1.31 - 2.62 ft/sec, indicative of limited community ambulator   General Gait Details: step to pattern due to baseline left drop foot from ankle fx, pt states he wears aFO but not currently present. cues for direction and heavy reliance on RW currently   Stairs             Wheelchair Mobility     Tilt Bed    Modified Rankin (Stroke Patients Only)       Balance Overall balance  assessment: Needs assistance Sitting-balance support: No upper extremity supported, Feet supported Sitting balance-Leahy Scale: Fair Sitting balance - Comments: EOB   Standing balance support: Bilateral upper extremity supported, Reliant on assistive device for balance, During functional activity Standing balance-Leahy Scale: Poor Standing balance comment: RW during gait                            Cognition Arousal/Alertness: Awake/alert Behavior During Therapy: WFL for tasks assessed/performed Overall Cognitive Status: Impaired/Different from baseline Area of Impairment: Orientation, Memory                 Orientation Level: Disoriented to, Time Current Attention Level: Selective Memory: Decreased short-term memory Following Commands: Follows one step commands consistently     Problem Solving: Slow processing          Exercises      General Comments        Pertinent Vitals/Pain Pain Assessment Pain Assessment: No/denies pain    Home Living                          Prior Function            PT Goals (current goals can now be found in the care plan section) Progress towards PT goals: Progressing toward goals    Frequency    Min 1X/week  PT Plan Current plan remains appropriate    Co-evaluation              AM-PAC PT "6 Clicks" Mobility   Outcome Measure  Help needed turning from your back to your side while in a flat bed without using bedrails?: None Help needed moving from lying on your back to sitting on the side of a flat bed without using bedrails?: None Help needed moving to and from a bed to a chair (including a wheelchair)?: A Little Help needed standing up from a chair using your arms (e.g., wheelchair or bedside chair)?: A Little Help needed to walk in hospital room?: A Little Help needed climbing 3-5 steps with a railing? : A Lot 6 Click Score: 19    End of Session   Activity Tolerance: Patient  tolerated treatment well Patient left: in chair;with call bell/phone within reach;with chair alarm set Nurse Communication: Mobility status PT Visit Diagnosis: Other abnormalities of gait and mobility (R26.89);Muscle weakness (generalized) (M62.81);Difficulty in walking, not elsewhere classified (R26.2)     Time: 6283-1517 PT Time Calculation (min) (ACUTE ONLY): 19 min  Charges:    $Gait Training: 8-22 mins PT General Charges $$ ACUTE PT VISIT: 1 Visit                     Merryl Hacker, PT Acute Rehabilitation Services Office: [phone]    Enedina Finner Ski Polich 10/31/2022, 7:52 AM

## 2022-10-31 NOTE — TOC Benefit Eligibility Note (Signed)
Pharmacy Patient Advocate Encounter  Insurance verification completed.    The patient is insured through [facility]    Ran test claim for Eliquis and the current 30 day co-pay is $0.00.   This test claim was processed through [facility]- copay amounts may vary at other pharmacies due to pharmacy/plan contracts, or as the patient moves through the different stages of their insurance plan.

## 2022-10-31 NOTE — Progress Notes (Signed)
PROGRESS NOTE    Troy Johnston  BJY:[id number] DOB: Dec 21, 1950 DOA: 10/25/2022 PCP: Patient, No Pcp Per    Brief Narrative:  72 year old male with past medical history of CKD stage IV, hypertension, type 2 diabetes who presents to the emergency department concerns of shortness of breath, and altered mental status. Patient now with gram-negative rods growing in blood as well as persistent hypotension admitted for septic shock. Patient also now in atrial fibrillation with RVR complicated by PEA cardiac arrest after started on amio.  Improved but still with    Assessment and Plan: #Status post PEA arrest #Heart failure with reduced ejection fraction   -Off pressors, SCV O2 normal  -Heart failure team was following, can start GDMT now that blood pressure improved -will need hydralazine nitrates and lieu of home losartan -holding off due to soft blood pressure and need for HD -Repeat echo in 1-2 weeks -Continue on telemetry   #Atrial fibrillation, new onset -reverted back to sinus rhythm 7/27 -Avoid amiodarone given bradycardia and hypotension -Continue heparin drip in case permacath required   Sepsis due to Enterobacter cloacae bacteremia and Norovirus -Continue meropenem day 6/7 --Enteric precautions for norovirus     AKI on CKD stage IV/Anion gap metabolic acidosis Patient is on CRRT.  -lasix/albumin -renal consult appreciated    Anemia of critical illness/ chronic diease Thrombocytopenia, improving  Likely in the setting of sepsis. No acute concerns for bleeding at this time.  -Continue heparin  -Monitor CBC     Elevated liver enzymes, improving  Due to shock liver.  -Follow-up hepatic function panel intermittently   Hypertension -Hold home meds until blood pressure improves   Type 2 diabetes mellitus, better controlled -SSI/insulin   Hyperlipidemia -Given underlying liver injury, will hold at this time   Costochondritis   Patient likely sore from  CPR. -Lidocaine patch   Obesity Estimated body mass index is 33.52 kg/m as calculated from the following:   Height as of this encounter: 5\' 8"  (1.727 m).   Weight as of this encounter: 100 kg.   DVT prophylaxis: SCDs Start: 10/25/22 1759    Code Status: DNR Family Communication: at bedside  Disposition Plan:  Level of care: Progressive Status is: Inpatient Remains inpatient appropriate     Consultants:  PCCM renal   Subjective: Feeling weel  Objective: Vitals:   10/31/22 0810 10/31/22 0900 10/31/22 1000 10/31/22 1115  BP:  (!) 113/47 (!) 127/50   Pulse:  68 67   Resp:  (!) 22 19   Temp: 98.4 F (36.9 C)   98.5 F (36.9 C)  TempSrc: Oral   Oral  SpO2:  100% 100%   Weight:      Height:        Intake/Output Summary (Last 24 hours) at 10/31/2022 1243 Last data filed at 10/31/2022 0800 Gross per 24 hour  Intake 435.41 ml  Output 2925 ml  Net -2489.59 ml   Filed Weights   10/29/22 0500 10/30/22 0500 10/31/22 0500  Weight: 102.1 kg 101.7 kg 100 kg    Examination:   General: Appearance:    Obese male in no acute distress     Lungs:     Clear to auscultation bilaterally, respirations unlabored  Heart:    Normal heart rate. Normal rhythm. No murmurs, rubs, or gallops.    MS:   All extremities are intact.    Neurologic:   Awake, alert, oriented x 3. No apparent focal neurological  defect.        Data Reviewed: I have personally reviewed following labs and imaging studies  CBC: Recent Labs  Lab 10/25/22 2154 10/26/22 0250 10/26/22 1005 10/26/22 1014 10/27/22 0308 10/27/22 0450 10/28/22 0433 10/29/22 0330 10/30/22 0500 10/31/22 0448  WBC 10.6*   < > 9.3   < > 14.4*  --  22.3* 23.8* 19.0* 16.6*  NEUTROABS 9.4*  --  8.0*  --   --   --   --   --   --   --   HGB 9.6*   < > 8.3*   < > 9.1* 9.5* 9.1* 8.1* 9.1* 8.3*  HCT 30.8*   < > 25.5*   < > 27.3* 28.0* 26.9* 24.2* 28.1* 25.9*  MCV 85.6   < > 82.3   < > 81.3  --  81.3 79.3* 81.0 82.2   PLT 53*   < > 50*   < > 35*  --  38* 52* 88* 106*   < > = values in this interval not displayed.   Basic Metabolic Panel: Recent Labs  Lab 10/27/22 0308 10/27/22 0450 10/28/22 0433 10/28/22 1602 10/29/22 0330 10/29/22 1648 10/30/22 0500 10/31/22 0447 10/31/22 0448  NA 136   < > 135 136 132* 133* 136 135  --   K 4.1   < > 3.9 3.9 3.6 3.2* 3.6 3.3*  --   CL 95*   < > 100 100 99 101 103 101  --   CO2 19*   < > 20* 21* 24 24 22 23   --   GLUCOSE 219*   < > 163* 213* 159* 173* 122* 192*  --   BUN 122*   < > 80* 68* 54* 55* 68* 81*  --   CREATININE 8.45*   < > 4.67* 3.71* 3.16* 3.24* 3.93* 3.97*  --   CALCIUM 7.5*   < > 7.2* 7.4* 7.1* 7.1* 7.3* 7.5*  --   MG 2.3  --  2.5*  --  2.4  --  2.4  --  2.4  PHOS  --    < > 3.8 2.9 2.0* 2.2* 3.5 4.2  --    < > = values in this interval not displayed.   GFR: Estimated Creatinine Clearance: 19 mL/min (A) (by C-G formula based on SCr of 3.97 mg/dL (H)). Liver Function Tests: Recent Labs  Lab 10/25/22 1527 10/27/22 0308 10/27/22 1614 10/28/22 0433 10/28/22 1602 10/29/22 0330 10/29/22 1648 10/30/22 0500 10/31/22 0447  AST 163* 906*  --  445*  --  327*  --   --   --   ALT 59* 646*  --  601*  --  536*  --   --   --   ALKPHOS 133* 123  --  104  --  129*  --   --   --   BILITOT 0.8 1.5*  --  1.9*  --  1.9*  --   --   --   PROT 6.7 5.6*  --  5.9*  --  5.5*  --   --   --   ALBUMIN 2.9* 2.0*   < > 2.0*  1.9* 2.2* 1.9* 2.0* 1.8* 2.1*   < > = values in this interval not displayed.   No results for input(s): "LIPASE", "AMYLASE" in the last 168 hours. No results for input(s): "AMMONIA" in the last 168 hours. Coagulation Profile: No results for input(s): "INR", "PROTIME" in the last 168 hours. Cardiac Enzymes: No results  for input(s): "CKTOTAL", "CKMB", "CKMBINDEX", "TROPONINI" in the last 168 hours. BNP (last 3 results) No results for input(s): "PROBNP" in the last 8760 hours. HbA1C: No results for input(s): "HGBA1C" in the last 72  hours. CBG: Recent Labs  Lab 10/30/22 1932 10/31/22 0008 10/31/22 0336 10/31/22 0808 10/31/22 1113  GLUCAP 112* 189* 187* 152* 150*   Lipid Profile: No results for input(s): "CHOL", "HDL", "LDLCALC", "TRIG", "CHOLHDL", "LDLDIRECT" in the last 72 hours. Thyroid Function Tests: No results for input(s): "TSH", "T4TOTAL", "FREET4", "T3FREE", "THYROIDAB" in the last 72 hours. Anemia Panel: No results for input(s): "VITAMINB12", "FOLATE", "FERRITIN", "TIBC", "IRON", "RETICCTPCT" in the last 72 hours. Sepsis Labs: Recent Labs  Lab 10/25/22 2050 10/25/22 2154 10/26/22 0815 10/26/22 1244 10/26/22 1554 10/27/22 0812  PROCALCITON >150.00  --   --   --   --   --   LATICACIDVEN  --    < > 4.1* 6.3* 5.8* 2.7*   < > = values in this interval not displayed.    Recent Results (from the past 240 hour(s))  Resp panel by RT-PCR (RSV, Flu A&B, Covid) Anterior Nasal Swab     Status: None   Collection Time: 10/25/22  3:41 PM   Specimen: Anterior Nasal Swab  Result Value Ref Range Status   SARS Coronavirus 2 by RT PCR NEGATIVE NEGATIVE Final   Influenza A by PCR NEGATIVE NEGATIVE Final   Influenza B by PCR NEGATIVE NEGATIVE Final    Comment: (NOTE) The Xpert Xpress SARS-CoV-2/FLU/RSV plus assay is intended as an aid in the diagnosis of influenza from Nasopharyngeal swab specimens and should not be used as a sole basis for treatment. Nasal washings and aspirates are unacceptable for Xpert Xpress SARS-CoV-2/FLU/RSV testing.  Fact Sheet for Patients: BloggerCourse.com  Fact Sheet for Healthcare Providers: SeriousBroker.it  This test is not yet approved or cleared by the [place] FDA and has been authorized for detection and/or diagnosis of SARS-CoV-2 by FDA under an Emergency Use Authorization (EUA). This EUA will remain in effect (meaning this test can be used) for the duration of the COVID-19 declaration under Section 564(b)(1) of  the Act, 21 U.S.C. section 360bbb-3(b)(1), unless the authorization is terminated or revoked.     Resp Syncytial Virus by PCR NEGATIVE NEGATIVE Final    Comment: (NOTE) Fact Sheet for Patients: BloggerCourse.com  Fact Sheet for Healthcare Providers: SeriousBroker.it  This test is not yet approved or cleared by the [place] FDA and has been authorized for detection and/or diagnosis of SARS-CoV-2 by FDA under an Emergency Use Authorization (EUA). This EUA will remain in effect (meaning this test can be used) for the duration of the COVID-19 declaration under Section 564(b)(1) of the Act, 21 U.S.C. section 360bbb-3(b)(1), unless the authorization is terminated or revoked.  Performed at [facility], [address] [place], [place] [address]   Blood Culture (routine x 2)     Status: Abnormal   Collection Time: 10/25/22  4:58 PM   Specimen: BLOOD RIGHT HAND  Result Value Ref Range Status   Specimen Description BLOOD RIGHT HAND  Final   Special Requests   Final    BOTTLES DRAWN AEROBIC ONLY Blood Culture results may not be optimal due to an inadequate volume of blood received in culture bottles   Culture  Setup Time   Final    GRAM NEGATIVE RODS AEROBIC BOTTLE ONLY CRITICAL RESULT CALLED TO, READ BACK BY AND VERIFIED WITH: PHARMD Myrtie Neither [phone] FCP Performed at [facility]  [facility], [address], [place], [place] [address]    Culture ENTEROBACTER CLOACAE (A)  Final   Report Status 10/28/2022 FINAL  Final   Organism ID, Bacteria ENTEROBACTER CLOACAE  Final      Susceptibility   Enterobacter cloacae - MIC*    CEFEPIME <=0.12 SENSITIVE Sensitive     CEFTAZIDIME <=1 SENSITIVE Sensitive     CIPROFLOXACIN <=0.25 SENSITIVE Sensitive     GENTAMICIN <=1 SENSITIVE Sensitive     IMIPENEM <=0.25 SENSITIVE Sensitive     TRIMETH/SULFA 80 RESISTANT Resistant     PIP/TAZO 64 INTERMEDIATE Intermediate     * ENTEROBACTER  CLOACAE  Blood Culture ID Panel (Reflexed)     Status: Abnormal   Collection Time: 10/25/22  4:58 PM  Result Value Ref Range Status   Enterococcus faecalis NOT DETECTED NOT DETECTED Final   Enterococcus Faecium NOT DETECTED NOT DETECTED Final   Listeria monocytogenes NOT DETECTED NOT DETECTED Final   Staphylococcus species NOT DETECTED NOT DETECTED Final   Staphylococcus aureus (BCID) NOT DETECTED NOT DETECTED Final   Staphylococcus epidermidis NOT DETECTED NOT DETECTED Final   Staphylococcus lugdunensis NOT DETECTED NOT DETECTED Final   Streptococcus species NOT DETECTED NOT DETECTED Final   Streptococcus agalactiae NOT DETECTED NOT DETECTED Final   Streptococcus pneumoniae NOT DETECTED NOT DETECTED Final   Streptococcus pyogenes NOT DETECTED NOT DETECTED Final   A.calcoaceticus-baumannii NOT DETECTED NOT DETECTED Final   Bacteroides fragilis NOT DETECTED NOT DETECTED Final   Enterobacterales DETECTED (A) NOT DETECTED Final    Comment: Enterobacterales represent a large order of gram negative bacteria, not a single organism. CRITICAL RESULT CALLED TO, READ BACK BY AND VERIFIED WITH: PHARMD EMILY S. [phone] FCP    Enterobacter cloacae complex DETECTED (A) NOT DETECTED Final    Comment: CRITICAL RESULT CALLED TO, READ BACK BY AND VERIFIED WITH: PHARMD EMILY S. [phone] FCP    Escherichia coli NOT DETECTED NOT DETECTED Final   Klebsiella aerogenes NOT DETECTED NOT DETECTED Final   Klebsiella oxytoca NOT DETECTED NOT DETECTED Final   Klebsiella pneumoniae NOT DETECTED NOT DETECTED Final   Proteus species NOT DETECTED NOT DETECTED Final   Salmonella species NOT DETECTED NOT DETECTED Final   Serratia marcescens NOT DETECTED NOT DETECTED Final   Haemophilus influenzae NOT DETECTED NOT DETECTED Final   Neisseria meningitidis NOT DETECTED NOT DETECTED Final   Pseudomonas aeruginosa NOT DETECTED NOT DETECTED Final   Stenotrophomonas maltophilia NOT DETECTED NOT DETECTED Final    Candida albicans NOT DETECTED NOT DETECTED Final   Candida auris NOT DETECTED NOT DETECTED Final   Candida glabrata NOT DETECTED NOT DETECTED Final   Candida krusei NOT DETECTED NOT DETECTED Final   Candida parapsilosis NOT DETECTED NOT DETECTED Final   Candida tropicalis NOT DETECTED NOT DETECTED Final   Cryptococcus neoformans/gattii NOT DETECTED NOT DETECTED Final   CTX-M ESBL NOT DETECTED NOT DETECTED Final   Carbapenem resistance IMP NOT DETECTED NOT DETECTED Final   Carbapenem resistance KPC NOT DETECTED NOT DETECTED Final   Carbapenem resistance NDM NOT DETECTED NOT DETECTED Final   Carbapenem resist OXA 48 LIKE NOT DETECTED NOT DETECTED Final   Carbapenem resistance VIM NOT DETECTED NOT DETECTED Final    Comment: Performed at [facility], [address] [place], [place] [address]  Blood Culture (routine x 2)     Status: None   Collection Time: 10/25/22  8:55 PM   Specimen: BLOOD RIGHT HAND  Result Value Ref Range Status  Specimen Description BLOOD RIGHT HAND  Final   Special Requests   Final    BOTTLES DRAWN AEROBIC AND ANAEROBIC Blood Culture adequate volume   Culture   Final    NO GROWTH 5 DAYS Performed at [facility], [address] [place], [place] [address]    Report Status 10/30/2022 FINAL  Final  MRSA Next Gen by PCR, Nasal     Status: None   Collection Time: 10/25/22  9:13 PM   Specimen: Nasal Mucosa; Nasal Swab  Result Value Ref Range Status   MRSA by PCR Next Gen NOT DETECTED NOT DETECTED Final    Comment: (NOTE) The GeneXpert MRSA Assay (FDA approved for NASAL specimens only), is one component of a comprehensive MRSA colonization surveillance program. It is not intended to diagnose MRSA infection nor to guide or monitor treatment for MRSA infections. Test performance is not FDA approved in patients less than 69 years old. Performed at [facility], [address] [place], [place] [address]   Gastrointestinal Panel by PCR , Stool      Status: Abnormal   Collection Time: 10/26/22  8:24 AM   Specimen: Stool  Result Value Ref Range Status   Campylobacter species NOT DETECTED NOT DETECTED Final   Plesimonas shigelloides NOT DETECTED NOT DETECTED Final   Salmonella species NOT DETECTED NOT DETECTED Final   Yersinia enterocolitica NOT DETECTED NOT DETECTED Final   Vibrio species NOT DETECTED NOT DETECTED Final   Vibrio cholerae NOT DETECTED NOT DETECTED Final   Enteroaggregative E coli (EAEC) NOT DETECTED NOT DETECTED Final   Enteropathogenic E coli (EPEC) NOT DETECTED NOT DETECTED Final   Enterotoxigenic E coli (ETEC) NOT DETECTED NOT DETECTED Final   Shiga like toxin producing E coli (STEC) NOT DETECTED NOT DETECTED Final   Shigella/Enteroinvasive E coli (EIEC) NOT DETECTED NOT DETECTED Final   Cryptosporidium NOT DETECTED NOT DETECTED Final   Cyclospora cayetanensis NOT DETECTED NOT DETECTED Final   Entamoeba histolytica NOT DETECTED NOT DETECTED Final   Giardia lamblia NOT DETECTED NOT DETECTED Final   Adenovirus F40/41 NOT DETECTED NOT DETECTED Final   Astrovirus NOT DETECTED NOT DETECTED Final   Norovirus GI/GII DETECTED (A) NOT DETECTED Final    Comment: RESULT CALLED TO, READ BACK BY AND VERIFIED WITH: SAM OWNLEY 10/26/22 2053 MU    Rotavirus A NOT DETECTED NOT DETECTED Final   Sapovirus (I, II, IV, and V) NOT DETECTED NOT DETECTED Final    Comment: Performed at [facility], [address] [place], [place] [address]  C Difficile Quick Screen w PCR reflex     Status: None   Collection Time: 10/26/22  8:24 AM   Specimen: STOOL  Result Value Ref Range Status   C Diff antigen NEGATIVE NEGATIVE Final   C Diff toxin NEGATIVE NEGATIVE Final   C Diff interpretation No C. difficile detected.  Final    Comment: Performed at [facility], [address] [place], [place] [address]         Radiology Studies: No results found.      Scheduled Meds:  Chlorhexidine Gluconate Cloth  6 each  Topical Daily   docusate sodium  100 mg Oral BID   feeding supplement  237 mL Oral TID BM   insulin aspart  0-15 Units Subcutaneous TID WC   insulin aspart  0-5 Units Subcutaneous QHS   insulin glargine-yfgn  10 Units Subcutaneous Daily   multivitamin  1 tablet Oral QHS  pantoprazole  40 mg Oral QHS   polyethylene glycol  17 g Oral Daily   potassium chloride  20 mEq Oral Once   Continuous Infusions:  sodium chloride Stopped (10/30/22 1501)   sodium chloride     albumin human     heparin 1,550 Units/hr (10/31/22 0800)   meropenem (MERREM) IV 200 mL/hr at 10/31/22 0800     LOS: 6 days    Time spent: 45 minutes spent on chart review, discussion with nursing staff, consultants, updating family and interview/physical exam; more than 50% of that time was spent in counseling and/or coordination of care.    Joseph Art, DO [facility] Available via Epic secure chat 7am-7pm After these hours, please refer to coverage provider listed on [email or web address] 10/31/2022, 12:43 PM

## 2022-10-31 NOTE — Progress Notes (Signed)
Echocardiogram 2D Echocardiogram has been performed.  Troy Johnston 10/31/2022, 3:53 PM

## 2022-10-31 NOTE — Progress Notes (Signed)
ANTICOAGULATION CONSULT NOTE - Follow Up  Pharmacy Consult for Heparin Indication: afib  Allergies  Allergen Reactions   Ferrous Sulfate Other (See Comments)   Penicillins Hives and Other (See Comments)    Has patient had a PCN reaction causing immediate rash, facial/tongue/throat swelling, SOB or lightheadedness with hypotension: no Has patient had a PCN reaction causing severe rash involving mucus membranes or skin necrosis: no Has patient had a PCN reaction that required hospitalization: yes Has patient had a PCN reaction occurring within the last 10 years: no If all of the above answers are "NO", then may proceed with Cephalosporin use.    Patient Measurements: Height: 5\' 8"  (172.7 cm) Weight: 100 kg (220 lb 7.4 oz) IBW/kg (Calculated) : 68.4 Heparin Dosing Weight: 88.3 kg  Vital Signs: Temp: 98.5 F (36.9 C) (07/29 1115) Temp Source: Oral (07/29 1115) BP: 127/50 (07/29 1000) Pulse Rate: 67 (07/29 1000)  Labs: Recent Labs    10/29/22 0330 10/29/22 1648 10/30/22 0500 10/31/22 0447 10/31/22 0448  HGB 8.1*  --  9.1*  --  8.3*  HCT 24.2*  --  28.1*  --  25.9*  PLT 52*  --  88*  --  106*  HEPARINUNFRC 0.47  --  0.37 0.34  --   CREATININE 3.16* 3.24* 3.93* 3.97*  --     Estimated Creatinine Clearance: 19 mL/min (A) (by C-G formula based on SCr of 3.97 mg/dL (H)).   Medical History: Past Medical History:  Diagnosis Date   Diabetes mellitus without complication (HCC)    GERD (gastroesophageal reflux disease)    Hypertension    Prostate cancer (HCC)     Medications:  Medications Prior to Admission  Medication Sig Dispense Refill Last Dose   allopurinol (ZYLOPRIM) 100 MG tablet Take 50 mg by mouth every other day. For high uric acid level   unk   Alogliptin Benzoate 12.5 MG TABS Take 6.25 mg by mouth every morning. For diabetes (replaces metformin)   unk   atorvastatin (LIPITOR) 20 MG tablet Take 20 mg by mouth at bedtime. For cholesterol   unk   carvedilol  (COREG) 25 MG tablet Take 12.5 mg by mouth 2 (two) times daily with a meal.   un   glipiZIDE (GLUCOTROL) 10 MG tablet Take 5 mg by mouth 2 (two) times daily before a meal. For diabetes   unk   hydrALAZINE (APRESOLINE) 100 MG tablet Take 100 mg by mouth every 8 (eight) hours.   unk   losartan (COZAAR) 100 MG tablet Take 100 mg by mouth daily.   unk   Scheduled:   Chlorhexidine Gluconate Cloth  6 each Topical Daily   docusate sodium  100 mg Oral BID   feeding supplement  237 mL Oral TID BM   insulin aspart  0-15 Units Subcutaneous TID WC   insulin aspart  0-5 Units Subcutaneous QHS   insulin glargine-yfgn  10 Units Subcutaneous Daily   multivitamin  1 tablet Oral QHS   pantoprazole  40 mg Oral QHS   polyethylene glycol  17 g Oral Daily   potassium chloride  20 mEq Oral Once   Infusions:   sodium chloride Stopped (10/30/22 1501)   sodium chloride     albumin human     heparin 1,550 Units/hr (10/31/22 0800)   meropenem (MERREM) IV 200 mL/hr at 10/31/22 0800   PRN: Place/Maintain arterial line **AND** sodium chloride, acetaminophen, docusate sodium, mouth rinse, polyethylene glycol  Assessment: 71 yom with history of DM, prostate cancer  remission, PUD and GIB presenting with SOB. Heparin per pharmacy consult placed for chest pain/ACS - planned 48 hrs of treatment for NSTEMI, but now heparin continuing for new afib. Patient is not on anticoagulation PTA.  Heparin held 7/24 AM after cardiac arrest. Cleared by CCM to restart later in the day 7/24. 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 CCM ok to use heparin and monitor platelet trend for now. Hg low but stable. S/p CRRT 7/25-7/27. Monitoring for renal recovery.  AM update - Heparin level remains therapeutic at low end of range at 0.34. Hg low but stable 8.3. Plt improved to 106. No bleeding or issues with infusion reported per discussion with RN.  Goal of Therapy:   Heparin level 0.3-0.7 units/ml Monitor platelets by anticoagulation protocol: Yes   Plan:  Increase heparin slightly to 1600 units/hr to ensure stays in range Monitor daily heparin level/CBC, s/sx bleeding F/u transition to apixaban as appropriate   Leia Alf, PharmD, BCPS Please check AMION for all [facility] contact numbers Clinical Pharmacist 10/31/2022 1:07 PM

## 2022-10-31 NOTE — Progress Notes (Signed)
[facility] Progress Note   Assessment/ Plan:   Troy Johnston is a/an 72 y.o. male with a past medical history significant for HTN, DM2, CKD, admitted for septic shock and enterobacter bacteremia.         AKI on CKD IV:  likely hemodynamically mediated in the setting of shock.              - started CRRT on 7/25 and stopped on 7/27             -Temporary dialysis catheter in place, appreciate help from CCM             -IV Lasix trial 7/28, did well and Cr plateaued  - will do Lasix/ albumin combo again today  - no indication for RRT- will take it day by day, not out of the woods yet but some encouraging signs   2.  Shock: septic with enterobacter bacteremia             - abx per primary             - TTE no obvious vegetation              -No longer requiring pressors             -Consider line holiday   3.  Elevated troponins/ NSTEMI/PEA arrest/Afib w/ RVR             - cardiology following             - anticoagulation per primary   4.  Acute hypoxic RF:             - now extubated and much improved   5.  Elevated LFTs; improving             - most likely d/t tissue ischemia   6.  Thrombocytopenia: stable             - likely in setting of septic shock  Subjective:    Made 2.4L urine yesterday- excellent. Sitting up in chair today, Cr plateaued   Objective:   BP (!) 139/59   Pulse 76   Temp 98.4 F (36.9 C) (Oral)   Resp 15   Ht 5\' 8"  (1.727 m)   Wt 100 kg   SpO2 100%   BMI 33.52 kg/m   Intake/Output Summary (Last 24 hours) at 10/31/2022 7829 Last data filed at 10/31/2022 0600 Gross per 24 hour  Intake 882.47 ml  Output 2475 ml  Net -1592.53 ml   Weight change: -1.7 kg  Physical Exam: Gen:NAD CVS: RRR Resp: muffled at bases Abd: soft Ext: 1+ LE edema ACCESS: R internal jugular nontunneled HD cath  Imaging: No results found.  Labs: BMET Recent Labs  Lab 10/27/22 1614 10/28/22 0433 10/28/22 1602 10/29/22 0330 10/29/22 1648  10/30/22 0500 10/31/22 0447  NA 136 135 136 132* 133* 136 135  K 4.0 3.9 3.9 3.6 3.2* 3.6 3.3*  CL 99 100 100 99 101 103 101  CO2 20* 20* 21* 24 24 22 23   GLUCOSE 200* 163* 213* 159* 173* 122* 192*  BUN 109* 80* 68* 54* 55* 68* 81*  CREATININE 6.63* 4.67* 3.71* 3.16* 3.24* 3.93* 3.97*  CALCIUM 7.0* 7.2* 7.4* 7.1* 7.1* 7.3* 7.5*  PHOS 5.3* 3.8 2.9 2.0* 2.2* 3.5 4.2   CBC Recent Labs  Lab 10/25/22 2154 10/26/22 0250 10/26/22 1005 10/26/22 1014 10/28/22 0433 10/29/22 0330 10/30/22 0500 10/31/22 0448  WBC 10.6*   < >  9.3   < > 22.3* 23.8* 19.0* 16.6*  NEUTROABS 9.4*  --  8.0*  --   --   --   --   --   HGB 9.6*   < > 8.3*   < > 9.1* 8.1* 9.1* 8.3*  HCT 30.8*   < > 25.5*   < > 26.9* 24.2* 28.1* 25.9*  MCV 85.6   < > 82.3   < > 81.3 79.3* 81.0 82.2  PLT 53*   < > 50*   < > 38* 52* 88* 106*   < > = values in this interval not displayed.    Medications:     Chlorhexidine Gluconate Cloth  6 each Topical Daily   docusate sodium  100 mg Oral BID   feeding supplement  237 mL Oral TID BM   insulin aspart  0-15 Units Subcutaneous TID WC   insulin aspart  0-5 Units Subcutaneous QHS   insulin glargine-yfgn  10 Units Subcutaneous Daily   multivitamin  1 tablet Oral QHS   pantoprazole  40 mg Oral QHS   polyethylene glycol  17 g Oral Daily   potassium chloride  20 mEq Oral Once   Bufford Buttner MD 10/31/2022, 9:04 AM

## 2022-11-01 DIAGNOSIS — N189 Chronic kidney disease, unspecified: Secondary | ICD-10-CM | POA: Diagnosis not present

## 2022-11-01 DIAGNOSIS — R6521 Severe sepsis with septic shock: Secondary | ICD-10-CM

## 2022-11-01 DIAGNOSIS — N179 Acute kidney failure, unspecified: Secondary | ICD-10-CM | POA: Diagnosis not present

## 2022-11-01 DIAGNOSIS — I469 Cardiac arrest, cause unspecified: Secondary | ICD-10-CM | POA: Diagnosis not present

## 2022-11-01 DIAGNOSIS — A419 Sepsis, unspecified organism: Secondary | ICD-10-CM

## 2022-11-01 LAB — GLUCOSE, CAPILLARY
Glucose-Capillary: 136 mg/dL — ABNORMAL HIGH (ref 70–99)
Glucose-Capillary: 119 mg/dL — ABNORMAL HIGH (ref 70–99)
Glucose-Capillary: 146 mg/dL — ABNORMAL HIGH (ref 70–99)
Glucose-Capillary: 170 mg/dL — ABNORMAL HIGH (ref 70–99)
Glucose-Capillary: 174 mg/dL — ABNORMAL HIGH (ref 70–99)

## 2022-11-01 MED ORDER — FUROSEMIDE 10 MG/ML IJ SOLN
80.0000 mg | Freq: Once | INTRAMUSCULAR | Status: AC
  Administered 2022-11-01: 80 mg via INTRAVENOUS
  Filled 2022-11-01: qty 8

## 2022-11-01 NOTE — Progress Notes (Signed)
Nutrition Follow-up  DOCUMENTATION CODES:   Not applicable  INTERVENTION:  - Continue REGULAR with 1200 ml fluid restriction - Continue Ensure Enlive po TID, each supplement provides 350 kcal and 20 grams of protein - Continue Renal MVI daily   NUTRITION DIAGNOSIS:   Increased nutrient needs related to acute illness (AKI on CRRT) as evidenced by estimated needs. - Progressing, now off CRRT  GOAL:   Patient will meet greater than or equal to 90% of their needs - Ongoing  MONITOR:   PO intake, Supplement acceptance, Labs, I & O's, Weight trends  REASON FOR ASSESSMENT:   Consult Assessment of nutrition requirement/status  ASSESSMENT:   72 year old male who presented to the ED on 7/23 with SOB. PMH of T2DM, prostate cancer in remission, PUD, GI bleed, CKD stage IV, GERD, HTN. Pt admitted with septic vs cardiogenic shock, AKI on CKD stage IV.  07/24 - cardiac arrest, intubated, found to have bacteremia and new systolic CHF 07/25 - CRRT start, extubated 07/26 - diet advanced to renal with 1200 ml fluid restriction 07/29 - transferred to floor  Pt laying in bed, reports eating ok. RD reviewed menu with pt. Denies any nausea or vomiting. Reports drinking the Ensure's, had 1 thus far today. RD encouraged pt to continue to drink.   Discussed with MD. Ongoing monitoring for possible need of HD.   Meal Intake 7/26-7/29: 35-100% x 5 meals   Medications reviewed and include: Colace, NovoLog SSI, Semglee, Rena-vit, Protonix, Miralax Labs reviewed: Sodium 135, Potassium 3.8, BUN 87, Creatinine 4.32, Phosphorus 4.6 CBG: 119-171 x 24 hrs   UOP: 2950 mL x 24 hrs   Diet Order:   Diet Order             Diet regular Room service appropriate? Yes; Fluid consistency: Thin; Fluid restriction: 1200 mL Fluid  Diet effective now                   EDUCATION NEEDS:   Education needs have been addressed  Skin:  Skin Assessment: Reviewed RN Assessment  Last BM:  7/29  Height:    Ht Readings from Last 1 Encounters:  10/26/22 5\' 8"  (1.727 m)    Weight:   Wt Readings from Last 1 Encounters:  10/31/22 100 kg    Ideal Body Weight:  70 kg  BMI:  Body mass index is 33.52 kg/m.  Estimated Nutritional Needs:   Kcal:  2100-2300  Protein:  120-140 grams  Fluid:  >1.8 L   Kirby Crigler RD, LDN Clinical Dietitian See [facility] for contact information.

## 2022-11-01 NOTE — Progress Notes (Signed)
Occupational Therapy Treatment & Discharge Patient Details Name: Troy Johnston MRN: [id number] DOB: 1950/08/13 Today's Date: 11/01/2022   History of present illness 72 yo male admitted 7/23 with SOB and shock. 7/24 PEA arrest with ROSC after 10 min ACLS, NSTEMI. Intubated 7/24-7/25. 7/25 - 7/27 CRRT. PMhx: T2DM, CKD, HTN, prostate CA   OT comments  Pt has met his acute OT goals. Upon arrival, pt was eager to participate and reported he just completed ADLs (sponge bath) independently. He demonstrated mod I transfers and mobility with RW this date and tolerated >10 minutes of OOB functional activity without sitting rest break or safety concern. Pt does not need further acute OT, however recommend frequent mobilization with staff and independent care for ADLs. OT to sign off. HHOT remains appropriate to facilitate safe transition to the home environment.     Recommendations for follow up therapy are one component of a multi-disciplinary discharge planning process, led by the attending physician.  Recommendations may be updated based on patient status, additional functional criteria and insurance authorization.    Assistance Recommended at Discharge Frequent or constant Supervision/Assistance  Patient can return home with the following  A little help with walking and/or transfers;A little help with bathing/dressing/bathroom;Assistance with cooking/housework;Direct supervision/assist for medications management;Direct supervision/assist for financial management;Assist for transportation;Help with stairs or ramp for entrance   Equipment Recommendations  Tub/shower seat       Precautions / Restrictions Precautions Precautions: Fall Precaution Comments: enteric Restrictions Weight Bearing Restrictions: No       Mobility Bed Mobility               General bed mobility comments: OOB in chair upon arrival    Transfers Overall transfer level: Needs assistance Equipment used: Rolling  walker (2 wheels) Transfers: Sit to/from Stand Sit to Stand: Modified independent (Device/Increase time)           General transfer comment: no assist needed, good hand placement and safety     Balance Overall balance assessment: No apparent balance deficits (not formally assessed)             ADL either performed or assessed with clinical judgement   ADL Overall ADL's : Modified independent                         Toilet Transfer: Modified Independent;Ambulation;Rolling walker (2 wheels)             General ADL Comments: pt had met his acute OT goals completed ADLs with set up / mod I today. Tolerated >10 minutes of standing functional activity with VSS and no sitting rest break    Extremity/Trunk Assessment Upper Extremity Assessment Upper Extremity Assessment: Overall WFL for tasks assessed   Lower Extremity Assessment Lower Extremity Assessment: Overall WFL for tasks assessed        Vision   Vision Assessment?: No apparent visual deficits   Perception Perception Perception: Not tested   Praxis Praxis Praxis: Not tested    Cognition Arousal/Alertness: Awake/alert Behavior During Therapy: WFL for tasks assessed/performed Overall Cognitive Status: Within Functional Limits for tasks assessed           General Comments: excellent motivation              General Comments VSS on RA    Pertinent Vitals/ Pain       Pain Assessment Pain Assessment: No/denies pain      Progress Toward Goals  OT Goals(current goals can now  be found in the care plan section)  Progress towards OT goals: Goals met/education completed, patient discharged from OT  Acute Rehab OT Goals Patient Stated Goal: to go home OT Goal Formulation: With patient Time For Goal Achievement: 11/13/22 Potential to Achieve Goals: Good  Plan All goals met and education completed, patient discharged from OT services       AM-PAC OT "6 Clicks" Daily Activity     Outcome  Measure   Help from another person eating meals?: None Help from another person taking care of personal grooming?: None Help from another person toileting, which includes using toliet, bedpan, or urinal?: None Help from another person bathing (including washing, rinsing, drying)?: None Help from another person to put on and taking off regular upper body clothing?: None Help from another person to put on and taking off regular lower body clothing?: None 6 Click Score: 24    End of Session Equipment Utilized During Treatment: Rolling walker (2 wheels)  OT Visit Diagnosis: Unsteadiness on feet (R26.81);Other abnormalities of gait and mobility (R26.89);Muscle weakness (generalized) (M62.81)   Activity Tolerance Patient tolerated treatment well   Patient Left in chair;with call bell/phone within reach   Nurse Communication Mobility status        Time: 0981-1914 OT Time Calculation (min): 19 min  Charges: OT General Charges $OT Visit: 1 Visit OT Treatments $Therapeutic Activity: 8-22 mins  Derenda Mis, OTR/L Acute Rehabilitation Services Office [phone] Secure Chat Communication Preferred   Donia Pounds 11/01/2022, 3:51 PM

## 2022-11-01 NOTE — Progress Notes (Signed)
Brief Heart failure progress note  Chart reviewed. Continue IV diuresis. Heart failure will continue to follow from afar with plan to add on GDMT later this week.   Troy Johnston 12:06 PM

## 2022-11-01 NOTE — TOC Progression Note (Signed)
Transition of Care [facility]) - Progression Note    Patient Details  Name: Troy Johnston MRN: [id number] Date of Birth: March 09, 1951  [facility] CM/SW Contact  Reva Bores, LCSWA Phone Number: 11/01/2022, 11:57 AM  Clinical Narrative: CSW/CM met with pt at the bedside. Pt stated that he has all of the appropriate equipment at home. CSW/CM presented pt with HH options. Pt is ok with [facility] through the [place], [place] providing [facility] services. CSW/CM explained that someone would come to the home and the services that would be provided. CSW spoke with  Lamar Benes [phone], who schedules the consults for the pt. CSW explained that the pt's d/c date is TBD. Terryn provided the pt's SW, Corrie Mckusick and PCP, Babs Sciara. CSW contacted Tresa Endo and confirmed that HH orders are in and d/c date TBD. TOC will continue following.     Expected Discharge Plan: Home/Self Care Barriers to Discharge: Continued Medical Work up  Expected Discharge Plan and Services       Living arrangements for the past 2 months: Single Family Home                                       Social Determinants of Health (SDOH) Interventions SDOH Screenings   Food Insecurity: No Food Insecurity (10/27/2022)  Housing: Low Risk  (10/27/2022)  Transportation Needs: No Transportation Needs (10/27/2022)  Utilities: Not At Risk (10/27/2022)  Alcohol Screen: Low Risk  (10/27/2022)  Financial Resource Strain: Low Risk  (10/27/2022)  Tobacco Use: Low Risk  (10/27/2022)  Health Literacy: Adequate Health Literacy (10/27/2022)    Readmission Risk Interventions     No data to display

## 2022-11-01 NOTE — Progress Notes (Signed)
Physical Therapy Treatment Patient Details Name: Troy Johnston MRN: [id number] DOB: 1950/09/26 Today's Date: 11/01/2022   History of Present Illness 72 yo male admitted 7/23 with SOB and shock. 7/24 PEA arrest with ROSC after 10 min ACLS, NSTEMI. Intubated 7/24-7/25. 7/25 - 7/27 CRRT. PMhx: T2DM, CKD, HTN, prostate CA    PT Comments  Patient eager to get OOB and ambulate. Found his ankle support and shoes and pt feels he is walking much better. Felt he still needed RW and did indeed have one loss of balance requiring min assist to recover.     If plan is discharge home, recommend the following: A little help with walking and/or transfers;A little help with bathing/dressing/bathroom;Assistance with cooking/housework;Assist for transportation   Can travel by private vehicle        Equipment Recommendations  None recommended by PT    Recommendations for Other Services       Precautions / Restrictions Precautions Precautions: Fall Precaution Comments: enteric Restrictions Weight Bearing Restrictions: No     Mobility  Bed Mobility Overal bed mobility: Modified Independent Bed Mobility: Supine to Sit     Supine to sit: HOB elevated, Modified independent (Device/Increase time)     General bed mobility comments: incr time    Transfers Overall transfer level: Needs assistance Equipment used: Rolling walker (2 wheels) Transfers: Sit to/from Stand Sit to Stand: Supervision                Ambulation/Gait Ambulation/Gait assistance: Min assist Gait Distance (Feet): 400 Feet Assistive device: Rolling walker (2 wheels) Gait Pattern/deviations: Step-to pattern       General Gait Details: +ankle brace found and used shoes with good results; pt with imbalance x1 when trying to lift RW over an obstacle   Stairs             Wheelchair Mobility     Tilt Bed    Modified Rankin (Stroke Patients Only)       Balance Overall balance assessment: Needs  assistance Sitting-balance support: No upper extremity supported, Feet supported Sitting balance-Leahy Scale: Fair Sitting balance - Comments: EOB   Standing balance support: Bilateral upper extremity supported, Reliant on assistive device for balance, During functional activity Standing balance-Leahy Scale: Poor Standing balance comment: RW during gait                            Cognition Arousal/Alertness: Awake/alert Behavior During Therapy: WFL for tasks assessed/performed Overall Cognitive Status: Within Functional Limits for tasks assessed                                          Exercises      General Comments General comments (skin integrity, edema, etc.): VSS on RA and monitor      Pertinent Vitals/Pain Pain Assessment Pain Assessment: No/denies pain    Home Living                          Prior Function            PT Goals (current goals can now be found in the care plan section) Acute Rehab PT Goals Patient Stated Goal: return home Time For Goal Achievement: 11/11/22 Potential to Achieve Goals: Good Progress towards PT goals: Progressing toward goals    Frequency    Min  1X/week      PT Plan Current plan remains appropriate    Co-evaluation              AM-PAC PT "6 Clicks" Mobility   Outcome Measure  Help needed turning from your back to your side while in a flat bed without using bedrails?: None Help needed moving from lying on your back to sitting on the side of a flat bed without using bedrails?: None Help needed moving to and from a bed to a chair (including a wheelchair)?: A Little Help needed standing up from a chair using your arms (e.g., wheelchair or bedside chair)?: A Little Help needed to walk in hospital room?: A Little Help needed climbing 3-5 steps with a railing? : A Little 6 Click Score: 20    End of Session   Activity Tolerance: Patient tolerated treatment well Patient left: in  chair;with call bell/phone within reach   PT Visit Diagnosis: Other abnormalities of gait and mobility (R26.89);Muscle weakness (generalized) (M62.81);Difficulty in walking, not elsewhere classified (R26.2)     Time: 2952-8413 PT Time Calculation (min) (ACUTE ONLY): 32 min  Charges:    $Gait Training: 23-37 mins PT General Charges $$ ACUTE PT VISIT: 1 Visit                      Troy Johnston, PT Acute Rehabilitation Services  Office [phone]    Troy Johnston 11/01/2022, 12:21 PM

## 2022-11-01 NOTE — Plan of Care (Signed)
  Problem: Education: Goal: Knowledge of General Education information will improve Description: Including pain rating scale, medication(s)/side effects and non-pharmacologic comfort measures Outcome: Progressing   Problem: Health Behavior/Discharge Planning: Goal: Ability to manage health-related needs will improve Outcome: Progressing   Problem: Clinical Measurements: Goal: Ability to maintain clinical measurements within normal limits will improve Outcome: Progressing Goal: Will remain free from infection Outcome: Progressing Goal: Diagnostic test results will improve Outcome: Progressing Goal: Respiratory complications will improve Outcome: Progressing Goal: Cardiovascular complication will be avoided Outcome: Progressing   Problem: Activity: Goal: Risk for activity intolerance will decrease Outcome: Progressing   Problem: Nutrition: Goal: Adequate nutrition will be maintained Outcome: Progressing   Problem: Coping: Goal: Level of anxiety will decrease Outcome: Progressing   Problem: Elimination: Goal: Will not experience complications related to bowel motility Outcome: Progressing Goal: Will not experience complications related to urinary retention Outcome: Progressing   Problem: Pain Managment: Goal: General experience of comfort will improve Outcome: Progressing   Problem: Safety: Goal: Ability to remain free from injury will improve Outcome: Progressing   Problem: Skin Integrity: Goal: Risk for impaired skin integrity will decrease Outcome: Progressing   Problem: Education: Goal: Ability to describe self-care measures that may prevent or decrease complications (Diabetes Survival Skills Education) will improve Outcome: Progressing Goal: Individualized Educational Video(s) Outcome: Progressing   Problem: Coping: Goal: Ability to adjust to condition or change in health will improve Outcome: Progressing   Problem: Fluid Volume: Goal: Ability to  maintain a balanced intake and output will improve Outcome: Progressing   Problem: Health Behavior/Discharge Planning: Goal: Ability to identify and utilize available resources and services will improve Outcome: Progressing Goal: Ability to manage health-related needs will improve Outcome: Progressing   Problem: Metabolic: Goal: Ability to maintain appropriate glucose levels will improve Outcome: Progressing   Problem: Nutritional: Goal: Maintenance of adequate nutrition will improve Outcome: Progressing Goal: Progress toward achieving an optimal weight will improve Outcome: Progressing   Problem: Skin Integrity: Goal: Risk for impaired skin integrity will decrease Outcome: Progressing   Problem: Tissue Perfusion: Goal: Adequacy of tissue perfusion will improve Outcome: Progressing   Problem: Activity: Goal: Ability to tolerate increased activity will improve Outcome: Progressing   Problem: Respiratory: Goal: Ability to maintain a clear airway and adequate ventilation will improve Outcome: Progressing   Problem: Role Relationship: Goal: Method of communication will improve Outcome: Progressing   

## 2022-11-01 NOTE — Progress Notes (Signed)
PROGRESS NOTE    DETAVIUS Johnston  FAO:[id number] DOB: [birth year] DOA: 10/25/2022 PCP: [facility]   Chief Complaint  Patient presents with   Shortness of Breath    Brief Narrative:    72 year old male with past medical history of CKD stage IV, hypertension, type 2 diabetes, who presents to ED secondary to dyspnea, altered mental status, Initial labs significant for BUN/creatinine 91/8.0, bicarbonate 10, elevated LFTs, no leukocytosis, lactate 6.8, hemoglobin 10.4 chronic, platelets 74 K, workup significant for gram-negative rods bacteremia, persistent hypotension, admitted for septic shock, and A-fib with RVR, this was complicated by PEA cardiac arrest after starting amiodarone   Significant events:  07/23: Admission to ICU 07/24: AF-RVR >> amio gtt ,Placement of HD cath, PEA arrest ,intubated ,ROSC after 10 minutes , EF low at 20% 07/25: Patient extubated, started CRRT , hypotensive and given amiodarone again 7/26 weaned off Levophed 7/27 CRRT stopped  Assessment & Plan:   Principal Problem:   Acute kidney injury superimposed on chronic kidney disease (HCC) Active Problems:   Cardiac arrest, cause unspecified (HCC)   Septic shock (HCC)   Acute respiratory failure with hypoxia (HCC)   Status post PEA arrest Heart failure with reduced ejection fraction - 07/23-Became bradycardic and had PEA arrest, CPR with ROSC after 10 minutes. Then had Vfib s/p defibrillation >>Afib.  - Currently weaned off pressors - Mentation much improved, back to baseline  AKI on CKD stage IV -Most likely in the setting of shock -Started CRRT on 7/25, on 7/27 -Management per renal, on IV Lasix -Creatinine and BUN gradually trending up, but so far no indication for RRT   Shock Sepsis due to Enterobacter cloacae bacteremia and Norovirus -Septic +/- cardiogenic -Blood culture significant for Enterobacter klutzy bacteremia norovirus, treated with meropenem total 7 days -Off  pressors.  Atrial fibrillation with RVR -  new onset -reverted back to sinus rhythm 7/27 -Failed IV amiodarone due to hypotension -On heparin GTT, transition to Eliquis once no further procedures anticipated  Acute biventricular systolic CHF - -Echo: EF 20-25%, moderate LVH, RV severely reduced  -Management per advanced heart failure team,   Anemia of critical illness/ chronic diease Thrombocytopenia, improving, most likely in the setting of septic shock.  Likely in the setting of sepsis. No acute concerns for bleeding at this time.  -Continue heparin  -Monitor CBC     Elevated liver enzymes, improving  Due to shock liver.  -Follow-up hepatic function panel intermittently   Hypertension -Hold home meds until blood pressure improves   Type 2 diabetes mellitus, better controlled -SSI/insulin   Hyperlipidemia -Given underlying liver injury, will hold at this time   Costochondritis   Patient likely sore from CPR. -Lidocaine patch    Obesity Estimated body mass index is 33.52 kg/m as calculated from the following:   Height as of this encounter: 5\' 8"  (1.727 m).   Weight as of this encounter: 100 kg.      DVT prophylaxis: Heparin GTT Code Status: Full Family Communication: None at bedside Disposition:   Status is: Inpatient  Consultants:  PCCM Cardiolgoy/CHF   Subjective: No significant events overnight, he denies any complaints today  Objective: Vitals:   10/31/22 2300 10/31/22 2346 11/01/22 0749 11/01/22 0800  BP: (!) 122/51 (!) 146/69  (!) 124/51  Pulse: 67 69  68  Resp: 18 19  16   Temp:  98.7 F (37.1 C) 99.3 F (37.4 C)   TempSrc:  Oral Oral   SpO2: 100% 98%  98%  Weight:      Height:        Intake/Output Summary (Last 24 hours) at 11/01/2022 1306 Last data filed at 11/01/2022 0447 Gross per 24 hour  Intake 620.03 ml  Output 2050 ml  Net -1429.97 ml   Filed Weights   10/29/22 0500 10/30/22 0500 10/31/22 0500  Weight: 102.1 kg 101.7 kg 100  kg    Examination:  Awake Alert, Oriented X 3, No new F.N deficits, Normal affect Symmetrical Chest wall movement, diminished at the bases RRR,No Gallops,Rubs or new Murmurs, No Parasternal Heave +ve B.Sounds, Abd Soft, No tenderness, No rebound - guarding or rigidity. No Cyanosis, Clubbing, +1 lower extremity edema, No new Rash or bruise    has a right internal jugular nontunneled temp HD catheter   Data Reviewed: I have personally reviewed following labs and imaging studies  CBC: Recent Labs  Lab 10/25/22 2154 10/26/22 0250 10/26/22 1005 10/26/22 1014 10/28/22 0433 10/29/22 0330 10/30/22 0500 10/31/22 0448 11/01/22 0404  WBC 10.6*   < > 9.3   < > 22.3* 23.8* 19.0* 16.6* 14.9*  NEUTROABS 9.4*  --  8.0*  --   --   --   --   --   --   HGB 9.6*   < > 8.3*   < > 9.1* 8.1* 9.1* 8.3* 8.7*  HCT 30.8*   < > 25.5*   < > 26.9* 24.2* 28.1* 25.9* 26.4*  MCV 85.6   < > 82.3   < > 81.3 79.3* 81.0 82.2 83.3  PLT 53*   < > 50*   < > 38* 52* 88* 106* 124*   < > = values in this interval not displayed.    Basic Metabolic Panel: Recent Labs  Lab 10/27/22 0308 10/27/22 0450 10/28/22 0433 10/28/22 1602 10/29/22 0330 10/29/22 1648 10/30/22 0500 10/31/22 0447 10/31/22 0448 11/01/22 0404  NA 136   < > 135   < > 132* 133* 136 135  --  135  K 4.1   < > 3.9   < > 3.6 3.2* 3.6 3.3*  --  3.8  CL 95*   < > 100   < > 99 101 103 101  --  100  CO2 19*   < > 20*   < > 24 24 22 23   --  23  GLUCOSE 219*   < > 163*   < > 159* 173* 122* 192*  --  153*  BUN 122*   < > 80*   < > 54* 55* 68* 81*  --  87*  CREATININE 8.45*   < > 4.67*   < > 3.16* 3.24* 3.93* 3.97*  --  4.32*  CALCIUM 7.5*   < > 7.2*   < > 7.1* 7.1* 7.3* 7.5*  --  7.7*  MG 2.3  --  2.5*  --  2.4  --  2.4  --  2.4  --   PHOS  --    < > 3.8   < > 2.0* 2.2* 3.5 4.2  --  4.6   < > = values in this interval not displayed.    GFR: Estimated Creatinine Clearance: 17.4 mL/min (A) (by C-G formula based on SCr of 4.32 mg/dL  (H)).  Liver Function Tests: Recent Labs  Lab 10/25/22 1527 10/27/22 0308 10/27/22 1614 10/28/22 0433 10/28/22 1602 10/29/22 0330 10/29/22 1648 10/30/22 0500 10/31/22 0447 11/01/22 0404  AST 163* 906*  --  445*  --  327*  --   --   --   --  ALT 59* 646*  --  601*  --  536*  --   --   --   --   ALKPHOS 133* 123  --  104  --  129*  --   --   --   --   BILITOT 0.8 1.5*  --  1.9*  --  1.9*  --   --   --   --   PROT 6.7 5.6*  --  5.9*  --  5.5*  --   --   --   --   ALBUMIN 2.9* 2.0*   < > 2.0*  1.9*   < > 1.9* 2.0* 1.8* 2.1* 2.4*   < > = values in this interval not displayed.    CBG: Recent Labs  Lab 10/31/22 1738 10/31/22 1955 10/31/22 2113 11/01/22 0747 11/01/22 1119  GLUCAP 136* 153* 171* 136* 119*     Recent Results (from the past 240 hour(s))  Resp panel by RT-PCR (RSV, Flu A&B, Covid) Anterior Nasal Swab     Status: None   Collection Time: 10/25/22  3:41 PM   Specimen: Anterior Nasal Swab  Result Value Ref Range Status   SARS Coronavirus 2 by RT PCR NEGATIVE NEGATIVE Final   Influenza A by PCR NEGATIVE NEGATIVE Final   Influenza B by PCR NEGATIVE NEGATIVE Final    Comment: (NOTE) The Xpert Xpress SARS-CoV-2/FLU/RSV plus assay is intended as an aid in the diagnosis of influenza from Nasopharyngeal swab specimens and should not be used as a sole basis for treatment. Nasal washings and aspirates are unacceptable for Xpert Xpress SARS-CoV-2/FLU/RSV testing.  Fact Sheet for Patients: BloggerCourse.com  Fact Sheet for Healthcare Providers: SeriousBroker.it  This test is not yet approved or cleared by the [place] FDA and has been authorized for detection and/or diagnosis of SARS-CoV-2 by FDA under an Emergency Use Authorization (EUA). This EUA will remain in effect (meaning this test can be used) for the duration of the COVID-19 declaration under Section 564(b)(1) of the Act, 21 U.S.C. section  360bbb-3(b)(1), unless the authorization is terminated or revoked.     Resp Syncytial Virus by PCR NEGATIVE NEGATIVE Final    Comment: (NOTE) Fact Sheet for Patients: BloggerCourse.com  Fact Sheet for Healthcare Providers: SeriousBroker.it  This test is not yet approved or cleared by the [place] FDA and has been authorized for detection and/or diagnosis of SARS-CoV-2 by FDA under an Emergency Use Authorization (EUA). This EUA will remain in effect (meaning this test can be used) for the duration of the COVID-19 declaration under Section 564(b)(1) of the Act, 21 U.S.C. section 360bbb-3(b)(1), unless the authorization is terminated or revoked.  Performed at [facility], [address] [place], [place] [address]   Blood Culture (routine x 2)     Status: Abnormal   Collection Time: 10/25/22  4:58 PM   Specimen: BLOOD RIGHT HAND  Result Value Ref Range Status   Specimen Description BLOOD RIGHT HAND  Final   Special Requests   Final    BOTTLES DRAWN AEROBIC ONLY Blood Culture results may not be optimal due to an inadequate volume of blood received in culture bottles   Culture  Setup Time   Final    GRAM NEGATIVE RODS AEROBIC BOTTLE ONLY CRITICAL RESULT CALLED TO, READ BACK BY AND VERIFIED WITH: Halford Chessman [id number] FCP Performed at [facility], [address], [place], [place] [address]    Culture ENTEROBACTER CLOACAE (A)  Final  Report Status 10/28/2022 FINAL  Final   Organism ID, Bacteria ENTEROBACTER CLOACAE  Final      Susceptibility   Enterobacter cloacae - MIC*    CEFEPIME <=0.12 SENSITIVE Sensitive     CEFTAZIDIME <=1 SENSITIVE Sensitive     CIPROFLOXACIN <=0.25 SENSITIVE Sensitive     GENTAMICIN <=1 SENSITIVE Sensitive     IMIPENEM <=0.25 SENSITIVE Sensitive     TRIMETH/SULFA 80 RESISTANT Resistant     PIP/TAZO 64 INTERMEDIATE Intermediate     * ENTEROBACTER CLOACAE  Blood Culture ID Panel  (Reflexed)     Status: Abnormal   Collection Time: 10/25/22  4:58 PM  Result Value Ref Range Status   Enterococcus faecalis NOT DETECTED NOT DETECTED Final   Enterococcus Faecium NOT DETECTED NOT DETECTED Final   Listeria monocytogenes NOT DETECTED NOT DETECTED Final   Staphylococcus species NOT DETECTED NOT DETECTED Final   Staphylococcus aureus (BCID) NOT DETECTED NOT DETECTED Final   Staphylococcus epidermidis NOT DETECTED NOT DETECTED Final   Staphylococcus lugdunensis NOT DETECTED NOT DETECTED Final   Streptococcus species NOT DETECTED NOT DETECTED Final   Streptococcus agalactiae NOT DETECTED NOT DETECTED Final   Streptococcus pneumoniae NOT DETECTED NOT DETECTED Final   Streptococcus pyogenes NOT DETECTED NOT DETECTED Final   A.calcoaceticus-baumannii NOT DETECTED NOT DETECTED Final   Bacteroides fragilis NOT DETECTED NOT DETECTED Final   Enterobacterales DETECTED (A) NOT DETECTED Final    Comment: Enterobacterales represent a large order of gram negative bacteria, not a single organism. CRITICAL RESULT CALLED TO, READ BACK BY AND VERIFIED WITH: PHARMD EMILY S. [phone] FCP    Enterobacter cloacae complex DETECTED (A) NOT DETECTED Final    Comment: CRITICAL RESULT CALLED TO, READ BACK BY AND VERIFIED WITH: PHARMD EMILY S. [phone] [id number] FCP    Escherichia coli NOT DETECTED NOT DETECTED Final   Klebsiella aerogenes NOT DETECTED NOT DETECTED Final   Klebsiella oxytoca NOT DETECTED NOT DETECTED Final   Klebsiella pneumoniae NOT DETECTED NOT DETECTED Final   Proteus species NOT DETECTED NOT DETECTED Final   Salmonella species NOT DETECTED NOT DETECTED Final   Serratia marcescens NOT DETECTED NOT DETECTED Final   Haemophilus influenzae NOT DETECTED NOT DETECTED Final   Neisseria meningitidis NOT DETECTED NOT DETECTED Final   Pseudomonas aeruginosa NOT DETECTED NOT DETECTED Final   Stenotrophomonas maltophilia NOT DETECTED NOT DETECTED Final   Candida albicans NOT DETECTED NOT  DETECTED Final   Candida auris NOT DETECTED NOT DETECTED Final   Candida glabrata NOT DETECTED NOT DETECTED Final   Candida krusei NOT DETECTED NOT DETECTED Final   Candida parapsilosis NOT DETECTED NOT DETECTED Final   Candida tropicalis NOT DETECTED NOT DETECTED Final   Cryptococcus neoformans/gattii NOT DETECTED NOT DETECTED Final   CTX-M ESBL NOT DETECTED NOT DETECTED Final   Carbapenem resistance IMP NOT DETECTED NOT DETECTED Final   Carbapenem resistance KPC NOT DETECTED NOT DETECTED Final   Carbapenem resistance NDM NOT DETECTED NOT DETECTED Final   Carbapenem resist OXA 48 LIKE NOT DETECTED NOT DETECTED Final   Carbapenem resistance VIM NOT DETECTED NOT DETECTED Final    Comment: Performed at [facility], [address], [place], [place] [address]  Blood Culture (routine x 2)     Status: None   Collection Time: 10/25/22  8:55 PM   Specimen: BLOOD RIGHT HAND  Result Value Ref Range Status   Specimen Description BLOOD RIGHT HAND  Final   Special Requests   Final    BOTTLES DRAWN  AEROBIC AND ANAEROBIC Blood Culture adequate volume   Culture   Final    NO GROWTH 5 DAYS Performed at [facility], [address] [place], [place] [address]    Report Status 10/30/2022 FINAL  Final  MRSA Next Gen by PCR, Nasal     Status: None   Collection Time: 10/25/22  9:13 PM   Specimen: Nasal Mucosa; Nasal Swab  Result Value Ref Range Status   MRSA by PCR Next Gen NOT DETECTED NOT DETECTED Final    Comment: (NOTE) The GeneXpert MRSA Assay (FDA approved for NASAL specimens only), is one component of a comprehensive MRSA colonization surveillance program. It is not intended to diagnose MRSA infection nor to guide or monitor treatment for MRSA infections. Test performance is not FDA approved in patients less than 63 years old. Performed at [facility], [address] [place], [place] [address]   Gastrointestinal Panel by PCR , Stool     Status: Abnormal   Collection  Time: 10/26/22  8:24 AM   Specimen: Stool  Result Value Ref Range Status   Campylobacter species NOT DETECTED NOT DETECTED Final   Plesimonas shigelloides NOT DETECTED NOT DETECTED Final   Salmonella species NOT DETECTED NOT DETECTED Final   Yersinia enterocolitica NOT DETECTED NOT DETECTED Final   Vibrio species NOT DETECTED NOT DETECTED Final   Vibrio cholerae NOT DETECTED NOT DETECTED Final   Enteroaggregative E coli (EAEC) NOT DETECTED NOT DETECTED Final   Enteropathogenic E coli (EPEC) NOT DETECTED NOT DETECTED Final   Enterotoxigenic E coli (ETEC) NOT DETECTED NOT DETECTED Final   Shiga like toxin producing E coli (STEC) NOT DETECTED NOT DETECTED Final   Shigella/Enteroinvasive E coli (EIEC) NOT DETECTED NOT DETECTED Final   Cryptosporidium NOT DETECTED NOT DETECTED Final   Cyclospora cayetanensis NOT DETECTED NOT DETECTED Final   Entamoeba histolytica NOT DETECTED NOT DETECTED Final   Giardia lamblia NOT DETECTED NOT DETECTED Final   Adenovirus F40/41 NOT DETECTED NOT DETECTED Final   Astrovirus NOT DETECTED NOT DETECTED Final   Norovirus GI/GII DETECTED (A) NOT DETECTED Final    Comment: RESULT CALLED TO, READ BACK BY AND VERIFIED WITH: SAM OWNLEY 10/26/22 2053 MU    Rotavirus A NOT DETECTED NOT DETECTED Final   Sapovirus (I, II, IV, and V) NOT DETECTED NOT DETECTED Final    Comment: Performed at [facility], [address] [place], [place] [address]  C Difficile Quick Screen w PCR reflex     Status: None   Collection Time: 10/26/22  8:24 AM   Specimen: STOOL  Result Value Ref Range Status   C Diff antigen NEGATIVE NEGATIVE Final   C Diff toxin NEGATIVE NEGATIVE Final   C Diff interpretation No C. difficile detected.  Final    Comment: Performed at [facility], [address], [place], [place] [address]         Radiology Studies: ECHOCARDIOGRAM LIMITED  Result Date: 10/31/2022    ECHOCARDIOGRAM LIMITED REPORT   Patient Name:   Troy Johnston  Date of Exam: 10/31/2022 Medical Rec #:  [id number]        Height:       68.0 in Accession #:    [id number]       Weight:       220.5 lb Date of Birth:  [birth year]        BSA:          2.130 m Patient Age:  71 years         BP:           127/50 mmHg Patient Gender: M                HR:           69 bpm. Exam Location:  Inpatient Procedure: Limited Echo, Limited Color Doppler and Intracardiac Opacification            Agent Indications:    CHF-Acute Systolic I50.21  History:        Patient has prior history of Echocardiogram examinations, most                 recent 10/26/2022.  Sonographer:    Lucendia Herrlich Referring Phys: [id number] LINDSAY NICOLE FINCH IMPRESSIONS  1. Left ventricular ejection fraction, by estimation, is 40 to 45%. The left ventricle has mildly decreased function. The left ventricle has no regional wall motion abnormalities. There is moderate concentric left ventricular hypertrophy. Left ventricular diastolic parameters are consistent with Grade I diastolic dysfunction (impaired relaxation).  2. Right ventricular systolic function is normal. The right ventricular size is normal.  3. The mitral valve is normal in structure. No evidence of mitral valve regurgitation. No evidence of mitral stenosis.  4. The aortic valve is normal in structure. There is mild calcification of the aortic valve. Aortic valve regurgitation is not visualized. No aortic stenosis is present.  5. The inferior vena cava is normal in size with greater than 50% respiratory variability, suggesting right atrial pressure of 3 mmHg. FINDINGS  Left Ventricle: Left ventricular ejection fraction, by estimation, is 40 to 45%. The left ventricle has mildly decreased function. The left ventricle has no regional wall motion abnormalities. Definity contrast agent was given IV to delineate the left ventricular endocardial borders. The left ventricular internal cavity size was normal in size. There is moderate concentric left ventricular  hypertrophy. Left ventricular diastolic parameters are consistent with Grade I diastolic dysfunction (impaired relaxation). Right Ventricle: The right ventricular size is normal. No increase in right ventricular wall thickness. Right ventricular systolic function is normal. Left Atrium: Left atrial size was normal in size. Right Atrium: Right atrial size was normal in size. Pericardium: There is no evidence of pericardial effusion. Mitral Valve: The mitral valve is normal in structure. No evidence of mitral valve stenosis. Tricuspid Valve: The tricuspid valve is normal in structure. Tricuspid valve regurgitation is not demonstrated. No evidence of tricuspid stenosis. Aortic Valve: The aortic valve is normal in structure. There is mild calcification of the aortic valve. There is mild aortic valve annular calcification. Aortic valve regurgitation is not visualized. No aortic stenosis is present. Pulmonic Valve: The pulmonic valve was normal in structure. Pulmonic valve regurgitation is not visualized. No evidence of pulmonic stenosis. Aorta: The aortic root is normal in size and structure. Venous: The inferior vena cava is normal in size with greater than 50% respiratory variability, suggesting right atrial pressure of 3 mmHg. IAS/Shunts: No atrial level shunt detected by color flow Doppler. LEFT VENTRICLE PLAX 2D LVIDd:         5.80 cm LVIDs:         4.80 cm LV PW:         1.00 cm LV IVS:        1.30 cm LVOT diam:     2.20 cm LVOT Area:     3.80 cm  IVC IVC diam: 1.70 cm LEFT ATRIUM  Index LA diam:    4.20 cm 1.97 cm/m   AORTA Ao Root diam: 3.10 cm Ao Asc diam:  2.90 cm TRICUSPID VALVE TR Peak grad:   6.7 mmHg TR Vmax:        129.00 cm/s  SHUNTS Systemic Diam: 2.20 cm Aditya Sabharwal Electronically signed by Dorthula Nettles Signature Date/Time: 10/31/2022/5:34:24 PM    Final         Scheduled Meds:  Chlorhexidine Gluconate Cloth  6 each Topical Daily   docusate sodium  100 mg Oral BID   feeding  supplement  237 mL Oral TID BM   insulin aspart  0-15 Units Subcutaneous TID WC   insulin aspart  0-5 Units Subcutaneous QHS   insulin glargine-yfgn  10 Units Subcutaneous Daily   multivitamin  1 tablet Oral QHS   pantoprazole  40 mg Oral QHS   polyethylene glycol  17 g Oral Daily   Continuous Infusions:  sodium chloride Stopped (10/30/22 1501)   sodium chloride     heparin 1,600 Units/hr (11/01/22 0619)     LOS: 7 days       Huey Bienenstock, MD [facility]   To contact the attending provider between 7A-7P or the covering provider during after hours 7P-7A, please log into the web site [email or web address] and access using universal [place] password for that web site. If you do not have the password, please call the hospital operator.  11/01/2022, 1:06 PM

## 2022-11-01 NOTE — Progress Notes (Signed)
[facility] Progress Note   Assessment/ Plan:   Troy Johnston is a/an 72 y.o. male with a past medical history significant for HTN, DM2, CKD, admitted for septic shock and enterobacter bacteremia.         AKI on CKD IV:  likely hemodynamically mediated in the setting of shock.              - started CRRT on 7/25 and stopped on 7/27             -Temporary dialysis catheter in place, appreciate help from CCM             -IV Lasix trial 7/28, did well and Cr plateaued  - IV Lasix today- decreased dose of 80 mg x 1  - no indication for RRT- will take it day by day, not out of the woods yet but some encouraging signs   2.  Shock: septic with enterobacter bacteremia             - abx per primary             - TTE no obvious vegetation              -No longer requiring pressors    3.  Elevated troponins/ NSTEMI/PEA arrest/Afib w/ RVR             - cardiology following             - anticoagulation per primary   4.  Acute hypoxic RF:             - now extubated and much improved   5.  Elevated LFTs; improving             - most likely d/t tissue ischemia   6.  Thrombocytopenia: stable             - likely in setting of septic shock  Subjective:    Made 2.4L urine yesterday- excellent. Sitting up in chair today, Cr plateaued   Objective:   BP (!) 124/51   Pulse 68   Temp 99.3 F (37.4 C) (Oral)   Resp 16   Ht 5\' 8"  (1.727 m)   Wt 100 kg   SpO2 98%   BMI 33.52 kg/m   Intake/Output Summary (Last 24 hours) at 11/01/2022 1046 Last data filed at 11/01/2022 0447 Gross per 24 hour  Intake 620.03 ml  Output 2050 ml  Net -1429.97 ml   Weight change:   Physical Exam: Gen:NAD CVS: RRR Resp: muffled at bases Abd: soft Ext: 1+ LE edema ACCESS: R internal jugular nontunneled HD cath  Imaging: ECHOCARDIOGRAM LIMITED  Result Date: 10/31/2022    ECHOCARDIOGRAM LIMITED REPORT   Patient Name:   Troy Johnston Date of Exam: 10/31/2022 Medical Rec #:  [id number]         Height:       68.0 in Accession #:    [id number]       Weight:       220.5 lb Date of Birth:  [birth year]        BSA:          2.130 m Patient Age:    71 years         BP:           127/50 mmHg Patient Gender: M                HR:  69 bpm. Exam Location:  Inpatient Procedure: Limited Echo, Limited Color Doppler and Intracardiac Opacification            Agent Indications:    CHF-Acute Systolic I50.21  History:        Patient has prior history of Echocardiogram examinations, most                 recent 10/26/2022.  Sonographer:    Lucendia Herrlich Referring Phys: [id number] LINDSAY NICOLE FINCH IMPRESSIONS  1. Left ventricular ejection fraction, by estimation, is 40 to 45%. The left ventricle has mildly decreased function. The left ventricle has no regional wall motion abnormalities. There is moderate concentric left ventricular hypertrophy. Left ventricular diastolic parameters are consistent with Grade I diastolic dysfunction (impaired relaxation).  2. Right ventricular systolic function is normal. The right ventricular size is normal.  3. The mitral valve is normal in structure. No evidence of mitral valve regurgitation. No evidence of mitral stenosis.  4. The aortic valve is normal in structure. There is mild calcification of the aortic valve. Aortic valve regurgitation is not visualized. No aortic stenosis is present.  5. The inferior vena cava is normal in size with greater than 50% respiratory variability, suggesting right atrial pressure of 3 mmHg. FINDINGS  Left Ventricle: Left ventricular ejection fraction, by estimation, is 40 to 45%. The left ventricle has mildly decreased function. The left ventricle has no regional wall motion abnormalities. Definity contrast agent was given IV to delineate the left ventricular endocardial borders. The left ventricular internal cavity size was normal in size. There is moderate concentric left ventricular hypertrophy. Left ventricular diastolic parameters are  consistent with Grade I diastolic dysfunction (impaired relaxation). Right Ventricle: The right ventricular size is normal. No increase in right ventricular wall thickness. Right ventricular systolic function is normal. Left Atrium: Left atrial size was normal in size. Right Atrium: Right atrial size was normal in size. Pericardium: There is no evidence of pericardial effusion. Mitral Valve: The mitral valve is normal in structure. No evidence of mitral valve stenosis. Tricuspid Valve: The tricuspid valve is normal in structure. Tricuspid valve regurgitation is not demonstrated. No evidence of tricuspid stenosis. Aortic Valve: The aortic valve is normal in structure. There is mild calcification of the aortic valve. There is mild aortic valve annular calcification. Aortic valve regurgitation is not visualized. No aortic stenosis is present. Pulmonic Valve: The pulmonic valve was normal in structure. Pulmonic valve regurgitation is not visualized. No evidence of pulmonic stenosis. Aorta: The aortic root is normal in size and structure. Venous: The inferior vena cava is normal in size with greater than 50% respiratory variability, suggesting right atrial pressure of 3 mmHg. IAS/Shunts: No atrial level shunt detected by color flow Doppler. LEFT VENTRICLE PLAX 2D LVIDd:         5.80 cm LVIDs:         4.80 cm LV PW:         1.00 cm LV IVS:        1.30 cm LVOT diam:     2.20 cm LVOT Area:     3.80 cm  IVC IVC diam: 1.70 cm LEFT ATRIUM         Index LA diam:    4.20 cm 1.97 cm/m   AORTA Ao Root diam: 3.10 cm Ao Asc diam:  2.90 cm TRICUSPID VALVE TR Peak grad:   6.7 mmHg TR Vmax:        129.00 cm/s  SHUNTS Systemic Diam: 2.20 cm  Dorthula Nettles Electronically signed by Dorthula Nettles Signature Date/Time: 10/31/2022/5:34:24 PM    Final     Labs: BMET Recent Labs  Lab 10/28/22 0865 10/28/22 1602 10/29/22 0330 10/29/22 1648 10/30/22 0500 10/31/22 0447 11/01/22 0404  NA 135 136 132* 133* 136 135 135  K 3.9 3.9  3.6 3.2* 3.6 3.3* 3.8  CL 100 100 99 101 103 101 100  CO2 20* 21* 24 24 22 23 23   GLUCOSE 163* 213* 159* 173* 122* 192* 153*  BUN 80* 68* 54* 55* 68* 81* 87*  CREATININE 4.67* 3.71* 3.16* 3.24* 3.93* 3.97* 4.32*  CALCIUM 7.2* 7.4* 7.1* 7.1* 7.3* 7.5* 7.7*  PHOS 3.8 2.9 2.0* 2.2* 3.5 4.2 4.6   CBC Recent Labs  Lab 10/25/22 2154 10/26/22 0250 10/26/22 1005 10/26/22 1014 10/29/22 0330 10/30/22 0500 10/31/22 0448 11/01/22 0404  WBC 10.6*   < > 9.3   < > 23.8* 19.0* 16.6* 14.9*  NEUTROABS 9.4*  --  8.0*  --   --   --   --   --   HGB 9.6*   < > 8.3*   < > 8.1* 9.1* 8.3* 8.7*  HCT 30.8*   < > 25.5*   < > 24.2* 28.1* 25.9* 26.4*  MCV 85.6   < > 82.3   < > 79.3* 81.0 82.2 83.3  PLT 53*   < > 50*   < > 52* 88* 106* 124*   < > = values in this interval not displayed.    Medications:     Chlorhexidine Gluconate Cloth  6 each Topical Daily   docusate sodium  100 mg Oral BID   feeding supplement  237 mL Oral TID BM   furosemide  80 mg Intravenous Once   insulin aspart  0-15 Units Subcutaneous TID WC   insulin aspart  0-5 Units Subcutaneous QHS   insulin glargine-yfgn  10 Units Subcutaneous Daily   multivitamin  1 tablet Oral QHS   pantoprazole  40 mg Oral QHS   polyethylene glycol  17 g Oral Daily   Bufford Buttner MD 11/01/2022, 10:46 AM

## 2022-11-01 NOTE — Progress Notes (Signed)
ANTICOAGULATION CONSULT NOTE - Follow Up  Pharmacy Consult for Heparin Indication: afib  Allergies  Allergen Reactions   Ferrous Sulfate Other (See Comments)   Penicillins Hives and Other (See Comments)    Has patient had a PCN reaction causing immediate rash, facial/tongue/throat swelling, SOB or lightheadedness with hypotension: no Has patient had a PCN reaction causing severe rash involving mucus membranes or skin necrosis: no Has patient had a PCN reaction that required hospitalization: yes Has patient had a PCN reaction occurring within the last 10 years: no If all of the above answers are "NO", then may proceed with Cephalosporin use.    Patient Measurements: Height: 5\' 8"  (172.7 cm) Weight: 100 kg (220 lb 7.4 oz) IBW/kg (Calculated) : 68.4 Heparin Dosing Weight: 88.3 kg  Vital Signs: Temp: 99.3 F (37.4 C) (07/30 0749) Temp Source: Oral (07/30 0749) BP: 124/51 (07/30 0800) Pulse Rate: 68 (07/30 0800)  Labs: Recent Labs    10/30/22 0500 10/31/22 0447 10/31/22 0448 11/01/22 0404  HGB 9.1*  --  8.3* 8.7*  HCT 28.1*  --  25.9* 26.4*  PLT 88*  --  106* 124*  HEPARINUNFRC 0.37 0.34  --  0.42  CREATININE 3.93* 3.97*  --  4.32*    Estimated Creatinine Clearance: 17.4 mL/min (A) (by C-G formula based on SCr of 4.32 mg/dL (H)).   Medical History: Past Medical History:  Diagnosis Date   Diabetes mellitus without complication (HCC)    GERD (gastroesophageal reflux disease)    Hypertension    Prostate cancer (HCC)     Medications:  Medications Prior to Admission  Medication Sig Dispense Refill Last Dose   allopurinol (ZYLOPRIM) 100 MG tablet Take 50 mg by mouth every other day. For high uric acid level   unk   Alogliptin Benzoate 12.5 MG TABS Take 6.25 mg by mouth every morning. For diabetes (replaces metformin)   unk   atorvastatin (LIPITOR) 20 MG tablet Take 20 mg by mouth at bedtime. For cholesterol   unk   carvedilol (COREG) 25 MG tablet Take 12.5 mg by  mouth 2 (two) times daily with a meal.   un   glipiZIDE (GLUCOTROL) 10 MG tablet Take 5 mg by mouth 2 (two) times daily before a meal. For diabetes   unk   hydrALAZINE (APRESOLINE) 100 MG tablet Take 100 mg by mouth every 8 (eight) hours.   unk   losartan (COZAAR) 100 MG tablet Take 100 mg by mouth daily.   unk   Scheduled:   Chlorhexidine Gluconate Cloth  6 each Topical Daily   docusate sodium  100 mg Oral BID   feeding supplement  237 mL Oral TID BM   insulin aspart  0-15 Units Subcutaneous TID WC   insulin aspart  0-5 Units Subcutaneous QHS   insulin glargine-yfgn  10 Units Subcutaneous Daily   multivitamin  1 tablet Oral QHS   pantoprazole  40 mg Oral QHS   polyethylene glycol  17 g Oral Daily   Infusions:   sodium chloride Stopped (10/30/22 1501)   sodium chloride     heparin 1,600 Units/hr (11/01/22 0619)   PRN: Place/Maintain arterial line **AND** sodium chloride, acetaminophen, docusate sodium, mouth rinse, polyethylene glycol  Assessment: 71 yom with history of DM, prostate cancer remission, PUD and GIB presenting with SOB. Heparin per pharmacy consult placed for chest pain/ACS - planned 48 hrs of treatment for NSTEMI, but now heparin continuing for new afib. Patient is not on anticoagulation PTA.  Heparin held 7/24  AM after cardiac arrest. Cleared by CCM to restart later in the day 7/24. 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 S/p CRRT 7/25-7/27. Monitoring for renal recovery.  Heparin level remains therapeutic at 0.42 after slight dose increase to 1600 units/hr. Hg low but stable at 8.7. Plt improved to 124. No bleeding or issues with infusion reported per discussion with RN.  Goal of Therapy:  Heparin level 0.3-0.7 units/ml Monitor platelets by anticoagulation protocol: Yes   Plan:  Continue heparin at 1600 units/hr to ensure stays in range Monitor daily heparin level/CBC, s/sx bleeding F/u  transition to apixaban as appropriate  Rennis Petty, PharmD 11/01/2022 9:00 AM

## 2022-11-01 NOTE — Progress Notes (Signed)
Pt just arrived to unit from [facility], care nurse and one additional nurse from [facility] accompanied pt. Vitals stable, pt A&O x 4. Pt has no concerns at this time . Call bell within reach , bed alarm set with bed in the lowest position.   10/31/22 2346  Vitals  Temp 98.7 F (37.1 C)  Temp Source Oral  BP (!) 146/69  MAP (mmHg) 85  BP Location Right Arm  BP Method Automatic  Patient Position (if appropriate) Lying  Pulse Rate 69  Pulse Rate Source Monitor  ECG Heart Rate 70  Resp 19  Level of Consciousness  Level of Consciousness Alert  MEWS COLOR  MEWS Score Color Green  Oxygen Therapy  SpO2 98 %  O2 Device Room Air  Pain Assessment  Pain Scale 0-10  Pain Score 0  MEWS Score  MEWS Temp 0  MEWS Systolic 0  MEWS Pulse 0  MEWS RR 0  MEWS LOC 0  MEWS Score 0

## 2022-11-02 DIAGNOSIS — J9601 Acute respiratory failure with hypoxia: Secondary | ICD-10-CM

## 2022-11-02 DIAGNOSIS — A419 Sepsis, unspecified organism: Secondary | ICD-10-CM | POA: Diagnosis not present

## 2022-11-02 DIAGNOSIS — I469 Cardiac arrest, cause unspecified: Secondary | ICD-10-CM | POA: Diagnosis not present

## 2022-11-02 DIAGNOSIS — N179 Acute kidney failure, unspecified: Secondary | ICD-10-CM | POA: Diagnosis not present

## 2022-11-02 LAB — GLUCOSE, CAPILLARY
Glucose-Capillary: 114 mg/dL — ABNORMAL HIGH (ref 70–99)
Glucose-Capillary: 117 mg/dL — ABNORMAL HIGH (ref 70–99)
Glucose-Capillary: 132 mg/dL — ABNORMAL HIGH (ref 70–99)
Glucose-Capillary: 158 mg/dL — ABNORMAL HIGH (ref 70–99)
Glucose-Capillary: 170 mg/dL — ABNORMAL HIGH (ref 70–99)

## 2022-11-02 LAB — HEPARIN LEVEL (UNFRACTIONATED): Heparin Unfractionated: 0.57 IU/mL (ref 0.30–0.70)

## 2022-11-02 LAB — HEPATITIS B SURFACE ANTIGEN: Hepatitis B Surface Ag: NONREACTIVE

## 2022-11-02 MED ORDER — CHLORHEXIDINE GLUCONATE CLOTH 2 % EX PADS
6.0000 | MEDICATED_PAD | Freq: Every day | CUTANEOUS | Status: DC
  Administered 2022-11-03 – 2022-11-09 (×7): 6 via TOPICAL

## 2022-11-02 MED ORDER — HEPARIN SODIUM (PORCINE) 1000 UNIT/ML IJ SOLN
INTRAMUSCULAR | Status: AC
  Filled 2022-11-02: qty 3

## 2022-11-02 NOTE — Progress Notes (Signed)
Physical Therapy Treatment Patient Details Name: Troy Johnston MRN: [id number] DOB: 26-Mar-1951 Today's Date: 11/02/2022   History of Present Illness 72 yo male admitted 7/23 with SOB and shock. 7/24 PEA arrest with ROSC after 10 min ACLS, NSTEMI. Intubated 7/24-7/25. 7/25 - 7/27 CRRT. PMhx: T2DM, CKD, HTN, prostate CA    PT Comments  Pt is slowly progressing with goals. He continues to requires Min A with gait due to balance deficits with a slow, steady, cautious gait pattern. Pt requires occasional verbal cues for safety with hand placement for sit to stand.  Due to pt current functional status, home set up and available assistance at home recommending skilled physical therapy services 3x/weekly on discharge from acute care hospital setting to address strength, balance and gait to decrease risk for falls, injury and re-hospitalization. Pt will require some assistance from family on discharge from acute care hospital setting with use of RW for functional mobility.    If plan is discharge home, recommend the following: A little help with walking and/or transfers;A little help with bathing/dressing/bathroom;Assistance with cooking/housework;Assist for transportation   Can travel by private vehicle        Equipment Recommendations  None recommended by PT    Recommendations for Other Services       Precautions / Restrictions Precautions Precautions: Fall Precaution Comments: enteric Restrictions Weight Bearing Restrictions: No     Mobility  Bed Mobility   General bed mobility comments: OOB in chair upon arrival, return to chair at end of session. Patient Response: Cooperative  Transfers Overall transfer level: Needs assistance Equipment used: Rolling walker (2 wheels) Transfers: Sit to/from Stand Sit to Stand: Supervision           General transfer comment: pt requires verbal cues for safe hand placment ~25% of the time    Ambulation/Gait Ambulation/Gait assistance:  Min assist Gait Distance (Feet): 400 Feet Assistive device: Rolling walker (2 wheels) Gait Pattern/deviations: Step-through pattern, Narrow base of support   Gait velocity interpretation: 1.31 - 2.62 ft/sec, indicative of limited community ambulator   General Gait Details: Pt demonstrates multidirectional sway and requires UE support for stability with dynamic functional activities. Verbal cues for pulling hips forward     Tilt Bed Tilt Bed Patient Response: Cooperative  Modified Rankin (Stroke Patients Only)       Balance Overall balance assessment: Needs assistance Sitting-balance support: No upper extremity supported, Feet supported Sitting balance-Leahy Scale: Fair Sitting balance - Comments: EOB   Standing balance support: Bilateral upper extremity supported, Reliant on assistive device for balance, During functional activity Standing balance-Leahy Scale: Fair Standing balance comment: Requires UE support during gait, multi directional sway        Cognition Arousal/Alertness: Awake/alert Behavior During Therapy: WFL for tasks assessed/performed Overall Cognitive Status: Within Functional Limits for tasks assessed         Following Commands: Follows one step commands consistently Safety/Judgement: Decreased awareness of safety   Problem Solving: Slow processing             General Comments General comments (skin integrity, edema, etc.): Pt states that he is moving about how he was prior to hospitalization.      Pertinent Vitals/Pain Pain Assessment Pain Assessment: No/denies pain     PT Goals (current goals can now be found in the care plan section) Acute Rehab PT Goals Patient Stated Goal: return home PT Goal Formulation: With patient Time For Goal Achievement: 11/11/22 Potential to Achieve Goals: Good Progress towards PT goals: Progressing  toward goals    Frequency    Min 1X/week      PT Plan Current plan remains appropriate        AM-PAC PT "6 Clicks" Mobility   Outcome Measure  Help needed turning from your back to your side while in a flat bed without using bedrails?: None Help needed moving from lying on your back to sitting on the side of a flat bed without using bedrails?: None Help needed moving to and from a bed to a chair (including a wheelchair)?: A Little Help needed standing up from a chair using your arms (e.g., wheelchair or bedside chair)?: A Little Help needed to walk in hospital room?: A Little Help needed climbing 3-5 steps with a railing? : A Little 6 Click Score: 20    End of Session Equipment Utilized During Treatment: Gait belt Activity Tolerance: Patient tolerated treatment well Patient left: with call bell/phone within reach;in chair Nurse Communication: Mobility status PT Visit Diagnosis: Other abnormalities of gait and mobility (R26.89);Muscle weakness (generalized) (M62.81);Difficulty in walking, not elsewhere classified (R26.2)     Time: 1324-4010 PT Time Calculation (min) (ACUTE ONLY): 27 min  Charges:    $Gait Training: 8-22 mins $Therapeutic Activity: 8-22 mins PT General Charges $$ ACUTE PT VISIT: 1 Visit                     Harrel Carina, DPT, CLT  Acute Rehabilitation Services Office: [phone] (Secure chat preferred)    Troy Johnston 11/02/2022, 2:23 PM

## 2022-11-02 NOTE — Progress Notes (Signed)
Troy Johnston Progress Note   Assessment/ Plan:   Troy Johnston is a/an 72 y.o. male with a past medical history significant for HTN, DM2, CKD, admitted for septic shock and enterobacter bacteremia.         AKI on CKD IV:  likely hemodynamically mediated in the setting of shock.              - started CRRT on 7/25 and stopped on 7/27             -Temporary dialysis catheter in place, appreciate help from CCM             -IV Lasix trial 7/28, did well and Cr plateaued--however Cr has been inching up day by day  - IV Lasix 7/30  - HD today for clearance 7/31  - likely will need to convert [facility] to tunneled, anticipate some ongoing HD needs at least for now   2.  Shock: septic with enterobacter bacteremia             - abx per primary             - TTE no obvious vegetation              -No longer requiring pressors    3.  Elevated troponins/ NSTEMI/PEA arrest/Afib w/ RVR             - cardiology following             - anticoagulation per primary   4.  Acute hypoxic RF:             - now extubated and much improved   5.  Elevated LFTs; improving             - most likely d/t tissue ischemia   6.  Thrombocytopenia: stable             - likely in setting of septic shock  Subjective:    Cr inching up day by day- made some urine, sleepier than usual.  BUN 86.  Needs other session of HD- discussed with pt, willing to proceed.   Objective:   BP (!) 163/66 (BP Location: Right Arm)   Pulse 64   Temp 98.8 F (37.1 C) (Oral)   Resp 17   Ht 5\' 8"  (1.727 m)   Wt 100 kg   SpO2 97%   BMI 33.52 kg/m   Intake/Output Summary (Last 24 hours) at 11/02/2022 0933 Last data filed at 11/02/2022 0645 Gross per 24 hour  Intake --  Output 1700 ml  Net -1700 ml   Weight change:   Physical Exam: Gen:NAD CVS: RRR Resp: muffled at bases Abd: soft Ext: 1+ LE edema ACCESS: R internal jugular nontunneled HD cath  Imaging: ECHOCARDIOGRAM LIMITED  Result Date: 10/31/2022     ECHOCARDIOGRAM LIMITED REPORT   Patient Name:   Troy Johnston Date of Exam: 10/31/2022 Medical Rec #:  [id number]        Height:       68.0 in Accession #:    [id number]       Weight:       220.5 lb Date of Birth:  [birth year]        BSA:          2.130 m Patient Age:    71 years         BP:           127/50 mmHg Patient Gender:  M                HR:           69 bpm. Exam Location:  Inpatient Procedure: Limited Echo, Limited Color Doppler and Intracardiac Opacification            Agent Indications:    CHF-Acute Systolic I50.21  History:        Patient has prior history of Echocardiogram examinations, most                 recent 10/26/2022.  Sonographer:    Lucendia Herrlich Referring Phys: [id number] LINDSAY NICOLE FINCH IMPRESSIONS  1. Left ventricular ejection fraction, by estimation, is 40 to 45%. The left ventricle has mildly decreased function. The left ventricle has no regional wall motion abnormalities. There is moderate concentric left ventricular hypertrophy. Left ventricular diastolic parameters are consistent with Grade I diastolic dysfunction (impaired relaxation).  2. Right ventricular systolic function is normal. The right ventricular size is normal.  3. The mitral valve is normal in structure. No evidence of mitral valve regurgitation. No evidence of mitral stenosis.  4. The aortic valve is normal in structure. There is mild calcification of the aortic valve. Aortic valve regurgitation is not visualized. No aortic stenosis is present.  5. The inferior vena cava is normal in size with greater than 50% respiratory variability, suggesting right atrial pressure of 3 mmHg. FINDINGS  Left Ventricle: Left ventricular ejection fraction, by estimation, is 40 to 45%. The left ventricle has mildly decreased function. The left ventricle has no regional wall motion abnormalities. Definity contrast agent was given IV to delineate the left ventricular endocardial borders. The left ventricular internal cavity size was  normal in size. There is moderate concentric left ventricular hypertrophy. Left ventricular diastolic parameters are consistent with Grade I diastolic dysfunction (impaired relaxation). Right Ventricle: The right ventricular size is normal. No increase in right ventricular wall thickness. Right ventricular systolic function is normal. Left Atrium: Left atrial size was normal in size. Right Atrium: Right atrial size was normal in size. Pericardium: There is no evidence of pericardial effusion. Mitral Valve: The mitral valve is normal in structure. No evidence of mitral valve stenosis. Tricuspid Valve: The tricuspid valve is normal in structure. Tricuspid valve regurgitation is not demonstrated. No evidence of tricuspid stenosis. Aortic Valve: The aortic valve is normal in structure. There is mild calcification of the aortic valve. There is mild aortic valve annular calcification. Aortic valve regurgitation is not visualized. No aortic stenosis is present. Pulmonic Valve: The pulmonic valve was normal in structure. Pulmonic valve regurgitation is not visualized. No evidence of pulmonic stenosis. Aorta: The aortic root is normal in size and structure. Venous: The inferior vena cava is normal in size with greater than 50% respiratory variability, suggesting right atrial pressure of 3 mmHg. IAS/Shunts: No atrial level shunt detected by color flow Doppler. LEFT VENTRICLE PLAX 2D LVIDd:         5.80 cm LVIDs:         4.80 cm LV PW:         1.00 cm LV IVS:        1.30 cm LVOT diam:     2.20 cm LVOT Area:     3.80 cm  IVC IVC diam: 1.70 cm LEFT ATRIUM         Index LA diam:    4.20 cm 1.97 cm/m   AORTA Ao Root diam: 3.10 cm Ao Asc diam:  2.90  cm TRICUSPID VALVE TR Peak grad:   6.7 mmHg TR Vmax:        129.00 cm/s  SHUNTS Systemic Diam: 2.20 cm Aditya Sabharwal Electronically signed by Dorthula Nettles Signature Date/Time: 10/31/2022/5:34:24 PM    Final     Labs: BMET Recent Labs  Lab 10/28/22 0433 10/28/22 1602  10/29/22 0330 10/29/22 1648 10/30/22 0500 10/31/22 0447 11/01/22 0404 11/02/22 0004  NA 135 136 132* 133* 136 135 135 135  K 3.9 3.9 3.6 3.2* 3.6 3.3* 3.8 4.1  CL 100 100 99 101 103 101 100 99  CO2 20* 21* 24 24 22 23 23 24   GLUCOSE 163* 213* 159* 173* 122* 192* 153* 168*  BUN 80* 68* 54* 55* 68* 81* 87* 86*  CREATININE 4.67* 3.71* 3.16* 3.24* 3.93* 3.97* 4.32* 4.63*  CALCIUM 7.2* 7.4* 7.1* 7.1* 7.3* 7.5* 7.7* 7.8*  PHOS 3.8 2.9 2.0* 2.2* 3.5 4.2 4.6  --    CBC Recent Labs  Lab 10/26/22 1005 10/26/22 1014 10/30/22 0500 10/31/22 0448 11/01/22 0404 11/02/22 0004  WBC 9.3   < > 19.0* 16.6* 14.9* 9.1  NEUTROABS 8.0*  --   --   --   --   --   HGB 8.3*   < > 9.1* 8.3* 8.7* 8.7*  HCT 25.5*   < > 28.1* 25.9* 26.4* 27.2*  MCV 82.3   < > 81.0 82.2 83.3 85.0  PLT 50*   < > 88* 106* 124* 141*   < > = values in this interval not displayed.    Medications:     Chlorhexidine Gluconate Cloth  6 each Topical Daily   Chlorhexidine Gluconate Cloth  6 each Topical Q0600   docusate sodium  100 mg Oral BID   feeding supplement  237 mL Oral TID BM   insulin aspart  0-15 Units Subcutaneous TID WC   insulin aspart  0-5 Units Subcutaneous QHS   insulin glargine-yfgn  10 Units Subcutaneous Daily   multivitamin  1 tablet Oral QHS   pantoprazole  40 mg Oral QHS   polyethylene glycol  17 g Oral Daily   Bufford Buttner MD 11/02/2022, 9:33 AM

## 2022-11-02 NOTE — Progress Notes (Addendum)
PROGRESS NOTE        PATIENT DETAILS Name: Troy Johnston Age: 72 y.o. Sex: male Date of Birth: Oct 11, 1950 Admit Date: 10/25/2022 Admitting Physician Oretha Milch, MD ZOX:WRUEAV, Lenn Sink  Brief Summary: Patient is a 72 y.o.  male with history of CKD 4, HTN, DM-2 who presented with dyspnea/altered mental status-found to have multifactorial shock (septic and cardiogenic), AKI-Hospital course complicated by PEA arrest-with ROSC after 10 minutes.  Stabilized in the ICU-evaluated by advanced heart failure team-and nephrology-subsequently transferred to [facility] on 7/30.  See below for further details.  Significant events: 07/23>>: Admission to ICU 07/24>> AF-RVR >> amio gtt ,Placement of HD cath, PEA arrest ,intubated ,ROSC after 10 minutes , EF low at 20% 07/25>> Patient extubated, started CRRT , hypotensive and given amiodarone again 07/26>> weaned off Levophed 07/27>> CRRT stopped 07/30>> transferred to [facility]  Significant studies: 7/23>> CXR: Possible retrocardiac opacities 7/23>> renal ultrasound: Mild right-sided hydronephrosis 7/24>> echo: EF 20-25%, RV systolic function severely reduced. 7/29>> repeat limited echo: EF 40-45%, RV systolic function normal  Significant microbiology data: 7/23>> COVID/influenza/RSV PCR: Negative 7/23>> blood culture: Enterobacter cloacae 7/24>> stool C. difficile: Negative 7/24>> stool GI pathogen panel: Norovirus  Procedures: 7/24-7/25>> ETT  Consults: Cardiology Nephrology PCCM  Subjective: Lying comfortably in bed-denies any chest pain or shortness of breath.  Objective: Vitals: Blood pressure (!) 163/66, pulse 64, temperature 98.8 F (37.1 C), temperature source Oral, resp. rate 17, height 5\' 8"  (1.727 m), weight 100 kg, SpO2 97%.   Exam: Gen Exam:Alert awake-not in any distress HEENT:atraumatic, normocephalic Chest: B/L clear to auscultation anteriorly CVS:S1S2 regular Abdomen:soft non tender, non  distended Extremities:no edema Neurology: Non focal Skin: no rash  Pertinent Labs/Radiology:    Latest Ref Rng & Units 11/02/2022   12:04 AM 11/01/2022    4:04 AM 10/31/2022    4:48 AM  CBC  WBC 4.0 - 10.5 K/uL 9.1  14.9  16.6   Hemoglobin 13.0 - 17.0 g/dL 8.7  8.7  8.3   Hematocrit 39.0 - 52.0 % 27.2  26.4  25.9   Platelets 150 - 400 K/uL 141  124  106     Lab Results  Component Value Date   NA 135 11/02/2022   K 4.1 11/02/2022   CL 99 11/02/2022   CO2 24 11/02/2022      Assessment/Plan: Multifactorial shock-septic and cardiogenic shock Enterobacter cloacae bacteremia Norovirus infection Shock physiology significantly better-BP stable Bacteremia treated with 7 days of empiric antibacterial therapy Continue to monitor off antibiotics  Acute biventricular systolic heart failure Volume status stabilizing-some mild lower extremity edema-lying flat Repeat echo on 7/29 with improvement in LVEF and RV systolic function Cardiology following-with plans to start GDMT in the next several days. Diuresis per nephrology  PEA cardiac arrest V-fib s/p defibrillation Paroxysmal atrial fibrillation Cardiac arrest felt to be in the setting of septic shock Did not tolerate amiodarone-thankfully maintaining sinus rhythm Continue IV heparin-with plans to transition to Eliquis once we have ensured that no further procedures are required.  AKI on CKD 4 Hemodynamically mediated in the setting of hypotension/shock Volume status stable-creatinine plateaued but now uptrending Nephrology following-await further recommendations.  Acute hypoxic respiratory failure Intubated in the setting of cardiac arrest-extubated on 7/25-currently doing well on room air.  Shock liver Follow transaminases  DM-2 (A1c 7.5 on 7/24) CBG stable on Semglee 10 units  daily and SSI  Recent Labs    11/01/22 2056 11/01/22 2315 11/02/22 0748  GLUCAP 170* 174* 117*     HLD Statin on hold due to elevated  LFTs  HTN BP stable-Coreg/hydralazine/losartan on hold for now.  Nutrition Status: Nutrition Problem: Increased nutrient needs Etiology: acute illness (AKI on CRRT) Signs/Symptoms: estimated needs Interventions: Ensure Enlive (each supplement provides 350kcal and 20 grams of protein), MVI, Liberalize Diet  Obesity: Estimated body mass index is 33.52 kg/m as calculated from the following:   Height as of this encounter: 5\' 8"  (1.727 m).   Weight as of this encounter: 100 kg.   Code status:   Code Status: DNR   DVT Prophylaxis: SCDs Start: 10/25/22 1759 IV Heparin   Family Communication: None at bedside   Disposition Plan: Status is: Inpatient Remains inpatient appropriate because: Severity of illness   Planned Discharge Destination:Home health   Diet: Diet Order             Diet regular Room service appropriate? Yes; Fluid consistency: Thin; Fluid restriction: 1200 mL Fluid  Diet effective now                     Antimicrobial agents: Anti-infectives (From admission, onward)    Start     Dose/Rate Route Frequency Ordered Stop   10/31/22 0600  meropenem (MERREM) 1 g in sodium chloride 0.9 % 100 mL IVPB        1 g 200 mL/hr over 30 Minutes Intravenous Every 24 hours 10/30/22 0835 11/01/22 0645   10/27/22 1400  meropenem (MERREM) 1 g in sodium chloride 0.9 % 100 mL IVPB  Status:  Discontinued        1 g 200 mL/hr over 30 Minutes Intravenous Every 8 hours 10/27/22 1122 10/30/22 0835   10/26/22 1000  meropenem (MERREM) 1 g in sodium chloride 0.9 % 100 mL IVPB  Status:  Discontinued        1 g 200 mL/hr over 30 Minutes Intravenous Daily 10/26/22 0825 10/27/22 1122   10/26/22 0500  aztreonam (AZACTAM) 2 g in sodium chloride 0.9 % 100 mL IVPB  Status:  Discontinued        2 g 200 mL/hr over 30 Minutes Intravenous Every 12 hours 10/26/22 0325 10/26/22 0825   10/26/22 0415  vancomycin (VANCOREADY) IVPB 1500 mg/300 mL  Status:  Discontinued        1,500 mg 150  mL/hr over 120 Minutes Intravenous Every 24 hours 10/26/22 0325 10/26/22 0359   10/26/22 0415  metroNIDAZOLE (FLAGYL) IVPB 500 mg  Status:  Discontinued        500 mg 100 mL/hr over 60 Minutes Intravenous Every 8 hours 10/26/22 0325 10/26/22 0829   10/26/22 0357  vancomycin variable dose per unstable renal function (pharmacist dosing)  Status:  Discontinued         Does not apply See admin instructions 10/26/22 0358 10/26/22 0829   10/25/22 1645  vancomycin (VANCOREADY) IVPB 2000 mg/400 mL        2,000 mg 200 mL/hr over 120 Minutes Intravenous  Once 10/25/22 1632 10/25/22 2132   10/25/22 1615  aztreonam (AZACTAM) 2 g in sodium chloride 0.9 % 100 mL IVPB        2 g 200 mL/hr over 30 Minutes Intravenous  Once 10/25/22 1607 10/25/22 1924   10/25/22 1615  metroNIDAZOLE (FLAGYL) IVPB 500 mg        500 mg 100 mL/hr over 60 Minutes Intravenous  Once  10/25/22 1607 10/25/22 1728   10/25/22 1615  vancomycin (VANCOCIN) IVPB 1000 mg/200 mL premix  Status:  Discontinued        1,000 mg 200 mL/hr over 60 Minutes Intravenous  Once 10/25/22 1607 10/25/22 1632        MEDICATIONS: Scheduled Meds:  Chlorhexidine Gluconate Cloth  6 each Topical Daily   Chlorhexidine Gluconate Cloth  6 each Topical Q0600   docusate sodium  100 mg Oral BID   feeding supplement  237 mL Oral TID BM   insulin aspart  0-15 Units Subcutaneous TID WC   insulin aspart  0-5 Units Subcutaneous QHS   insulin glargine-yfgn  10 Units Subcutaneous Daily   multivitamin  1 tablet Oral QHS   pantoprazole  40 mg Oral QHS   polyethylene glycol  17 g Oral Daily   Continuous Infusions:  sodium chloride Stopped (10/30/22 1501)   sodium chloride     heparin 1,600 Units/hr (11/01/22 2318)   PRN Meds:.Place/Maintain arterial line **AND** sodium chloride, acetaminophen, docusate sodium, mouth rinse, polyethylene glycol   I have personally reviewed following labs and imaging studies  LABORATORY DATA: CBC: Recent Labs  Lab  10/26/22 1005 10/26/22 1014 10/29/22 0330 10/30/22 0500 10/31/22 0448 11/01/22 0404 11/02/22 0004  WBC 9.3   < > 23.8* 19.0* 16.6* 14.9* 9.1  NEUTROABS 8.0*  --   --   --   --   --   --   HGB 8.3*   < > 8.1* 9.1* 8.3* 8.7* 8.7*  HCT 25.5*   < > 24.2* 28.1* 25.9* 26.4* 27.2*  MCV 82.3   < > 79.3* 81.0 82.2 83.3 85.0  PLT 50*   < > 52* 88* 106* 124* 141*   < > = values in this interval not displayed.    Basic Metabolic Panel: Recent Labs  Lab 10/27/22 0308 10/27/22 0450 10/28/22 0433 10/28/22 1602 10/29/22 0330 10/29/22 1648 10/30/22 0500 10/31/22 0447 10/31/22 0448 11/01/22 0404 11/02/22 0004  NA 136   < > 135   < > 132* 133* 136 135  --  135 135  K 4.1   < > 3.9   < > 3.6 3.2* 3.6 3.3*  --  3.8 4.1  CL 95*   < > 100   < > 99 101 103 101  --  100 99  CO2 19*   < > 20*   < > 24 24 22 23   --  23 24  GLUCOSE 219*   < > 163*   < > 159* 173* 122* 192*  --  153* 168*  BUN 122*   < > 80*   < > 54* 55* 68* 81*  --  87* 86*  CREATININE 8.45*   < > 4.67*   < > 3.16* 3.24* 3.93* 3.97*  --  4.32* 4.63*  CALCIUM 7.5*   < > 7.2*   < > 7.1* 7.1* 7.3* 7.5*  --  7.7* 7.8*  MG 2.3  --  2.5*  --  2.4  --  2.4  --  2.4  --   --   PHOS  --    < > 3.8   < > 2.0* 2.2* 3.5 4.2  --  4.6  --    < > = values in this interval not displayed.    GFR: Estimated Creatinine Clearance: 16.3 mL/min (A) (by C-G formula based on SCr of 4.63 mg/dL (H)).  Liver Function Tests: Recent Labs  Lab 10/27/22 0308 10/27/22 1614  10/28/22 0433 10/28/22 1602 10/29/22 0330 10/29/22 1648 10/30/22 0500 10/31/22 0447 11/01/22 0404  AST 906*  --  445*  --  327*  --   --   --   --   ALT 646*  --  601*  --  536*  --   --   --   --   ALKPHOS 123  --  104  --  129*  --   --   --   --   BILITOT 1.5*  --  1.9*  --  1.9*  --   --   --   --   PROT 5.6*  --  5.9*  --  5.5*  --   --   --   --   ALBUMIN 2.0*   < > 2.0*  1.9*   < > 1.9* 2.0* 1.8* 2.1* 2.4*   < > = values in this interval not displayed.   No results  for input(s): "LIPASE", "AMYLASE" in the last 168 hours. No results for input(s): "AMMONIA" in the last 168 hours.  Coagulation Profile: No results for input(s): "INR", "PROTIME" in the last 168 hours.  Cardiac Enzymes: No results for input(s): "CKTOTAL", "CKMB", "CKMBINDEX", "TROPONINI" in the last 168 hours.  BNP (last 3 results) No results for input(s): "PROBNP" in the last 8760 hours.  Lipid Profile: No results for input(s): "CHOL", "HDL", "LDLCALC", "TRIG", "CHOLHDL", "LDLDIRECT" in the last 72 hours.  Thyroid Function Tests: No results for input(s): "TSH", "T4TOTAL", "FREET4", "T3FREE", "THYROIDAB" in the last 72 hours.  Anemia Panel: No results for input(s): "VITAMINB12", "FOLATE", "FERRITIN", "TIBC", "IRON", "RETICCTPCT" in the last 72 hours.  Urine analysis:    Component Value Date/Time   COLORURINE YELLOW 10/14/2013 1506   APPEARANCEUR CLEAR 10/14/2013 1506   LABSPEC 1.017 10/14/2013 1506   PHURINE 5.0 10/14/2013 1506   GLUCOSEU NEGATIVE 10/14/2013 1506   HGBUR TRACE (A) 10/14/2013 1506   BILIRUBINUR NEGATIVE 10/14/2013 1506   KETONESUR NEGATIVE 10/14/2013 1506   PROTEINUR 30 (A) 10/14/2013 1506   UROBILINOGEN 0.2 10/14/2013 1506   NITRITE NEGATIVE 10/14/2013 1506   LEUKOCYTESUR NEGATIVE 10/14/2013 1506    Sepsis Labs: Lactic Acid, Venous    Component Value Date/Time   LATICACIDVEN 2.7 (HH) 10/27/2022 0812    MICROBIOLOGY: Recent Results (from the past 240 hour(s))  Resp panel by RT-PCR (RSV, Flu A&B, Covid) Anterior Nasal Swab     Status: None   Collection Time: 10/25/22  3:41 PM   Specimen: Anterior Nasal Swab  Result Value Ref Range Status   SARS Coronavirus 2 by RT PCR NEGATIVE NEGATIVE Final   Influenza A by PCR NEGATIVE NEGATIVE Final   Influenza B by PCR NEGATIVE NEGATIVE Final    Comment: (NOTE) The Xpert Xpress SARS-CoV-2/FLU/RSV plus assay is intended as an aid in the diagnosis of influenza from Nasopharyngeal swab specimens and should not  be used as a sole basis for treatment. Nasal washings and aspirates are unacceptable for Xpert Xpress SARS-CoV-2/FLU/RSV testing.  Fact Sheet for Patients: BloggerCourse.com  Fact Sheet for Healthcare Providers: SeriousBroker.it  This test is not yet approved or cleared by the [place] FDA and has been authorized for detection and/or diagnosis of SARS-CoV-2 by FDA under an Emergency Use Authorization (EUA). This EUA will remain in effect (meaning this test can be used) for the duration of the COVID-19 declaration under Section 564(b)(1) of the Act, 21 U.S.C. section 360bbb-3(b)(1), unless the authorization is terminated or revoked.     Resp Syncytial Virus  by PCR NEGATIVE NEGATIVE Final    Comment: (NOTE) Fact Sheet for Patients: BloggerCourse.com  Fact Sheet for Healthcare Providers: SeriousBroker.it  This test is not yet approved or cleared by the [place] FDA and has been authorized for detection and/or diagnosis of SARS-CoV-2 by FDA under an Emergency Use Authorization (EUA). This EUA will remain in effect (meaning this test can be used) for the duration of the COVID-19 declaration under Section 564(b)(1) of the Act, 21 U.S.C. section 360bbb-3(b)(1), unless the authorization is terminated or revoked.  Performed at [facility], [address] [place], [place] [address]   Blood Culture (routine x 2)     Status: Abnormal   Collection Time: 10/25/22  4:58 PM   Specimen: BLOOD RIGHT HAND  Result Value Ref Range Status   Specimen Description BLOOD RIGHT HAND  Final   Special Requests   Final    BOTTLES DRAWN AEROBIC ONLY Blood Culture results may not be optimal due to an inadequate volume of blood received in culture bottles   Culture  Setup Time   Final    GRAM NEGATIVE RODS AEROBIC BOTTLE ONLY CRITICAL RESULT CALLED TO, READ BACK BY AND VERIFIED WITH:  Halford Chessman [id number] FCP Performed at [facility], [address] [place], [place] [address]    Culture ENTEROBACTER CLOACAE (A)  Final   Report Status 10/28/2022 FINAL  Final   Organism ID, Bacteria ENTEROBACTER CLOACAE  Final      Susceptibility   Enterobacter cloacae - MIC*    CEFEPIME <=0.12 SENSITIVE Sensitive     CEFTAZIDIME <=1 SENSITIVE Sensitive     CIPROFLOXACIN <=0.25 SENSITIVE Sensitive     GENTAMICIN <=1 SENSITIVE Sensitive     IMIPENEM <=0.25 SENSITIVE Sensitive     TRIMETH/SULFA 80 RESISTANT Resistant     PIP/TAZO 64 INTERMEDIATE Intermediate     * ENTEROBACTER CLOACAE  Blood Culture ID Panel (Reflexed)     Status: Abnormal   Collection Time: 10/25/22  4:58 PM  Result Value Ref Range Status   Enterococcus faecalis NOT DETECTED NOT DETECTED Final   Enterococcus Faecium NOT DETECTED NOT DETECTED Final   Listeria monocytogenes NOT DETECTED NOT DETECTED Final   Staphylococcus species NOT DETECTED NOT DETECTED Final   Staphylococcus aureus (BCID) NOT DETECTED NOT DETECTED Final   Staphylococcus epidermidis NOT DETECTED NOT DETECTED Final   Staphylococcus lugdunensis NOT DETECTED NOT DETECTED Final   Streptococcus species NOT DETECTED NOT DETECTED Final   Streptococcus agalactiae NOT DETECTED NOT DETECTED Final   Streptococcus pneumoniae NOT DETECTED NOT DETECTED Final   Streptococcus pyogenes NOT DETECTED NOT DETECTED Final   A.calcoaceticus-baumannii NOT DETECTED NOT DETECTED Final   Bacteroides fragilis NOT DETECTED NOT DETECTED Final   Enterobacterales DETECTED (A) NOT DETECTED Final    Comment: Enterobacterales represent a large order of gram negative bacteria, not a single organism. CRITICAL RESULT CALLED TO, READ BACK BY AND VERIFIED WITH: PHARMD EMILY S. [id number] [id number] FCP    Enterobacter cloacae complex DETECTED (A) NOT DETECTED Final    Comment: CRITICAL RESULT CALLED TO, READ BACK BY AND VERIFIED WITH: PHARMD EMILY S. [id number] FCP     Escherichia coli NOT DETECTED NOT DETECTED Final   Klebsiella aerogenes NOT DETECTED NOT DETECTED Final   Klebsiella oxytoca NOT DETECTED NOT DETECTED Final   Klebsiella pneumoniae NOT DETECTED NOT DETECTED Final   Proteus species NOT DETECTED NOT DETECTED Final   Salmonella species NOT DETECTED NOT DETECTED Final   Serratia  marcescens NOT DETECTED NOT DETECTED Final   Haemophilus influenzae NOT DETECTED NOT DETECTED Final   Neisseria meningitidis NOT DETECTED NOT DETECTED Final   Pseudomonas aeruginosa NOT DETECTED NOT DETECTED Final   Stenotrophomonas maltophilia NOT DETECTED NOT DETECTED Final   Candida albicans NOT DETECTED NOT DETECTED Final   Candida auris NOT DETECTED NOT DETECTED Final   Candida glabrata NOT DETECTED NOT DETECTED Final   Candida krusei NOT DETECTED NOT DETECTED Final   Candida parapsilosis NOT DETECTED NOT DETECTED Final   Candida tropicalis NOT DETECTED NOT DETECTED Final   Cryptococcus neoformans/gattii NOT DETECTED NOT DETECTED Final   CTX-M ESBL NOT DETECTED NOT DETECTED Final   Carbapenem resistance IMP NOT DETECTED NOT DETECTED Final   Carbapenem resistance KPC NOT DETECTED NOT DETECTED Final   Carbapenem resistance NDM NOT DETECTED NOT DETECTED Final   Carbapenem resist OXA 48 LIKE NOT DETECTED NOT DETECTED Final   Carbapenem resistance VIM NOT DETECTED NOT DETECTED Final    Comment: Performed at [facility], [address], [place], [place] [address]  Blood Culture (routine x 2)     Status: None   Collection Time: 10/25/22  8:55 PM   Specimen: BLOOD RIGHT HAND  Result Value Ref Range Status   Specimen Description BLOOD RIGHT HAND  Final   Special Requests   Final    BOTTLES DRAWN AEROBIC AND ANAEROBIC Blood Culture adequate volume   Culture   Final    NO GROWTH 5 DAYS Performed at [facility], [address], [place], [place] [address]    Report Status 10/30/2022 FINAL  Final  MRSA Next Gen by PCR, Nasal     Status: None    Collection Time: 10/25/22  9:13 PM   Specimen: Nasal Mucosa; Nasal Swab  Result Value Ref Range Status   MRSA by PCR Next Gen NOT DETECTED NOT DETECTED Final    Comment: (NOTE) The GeneXpert MRSA Assay (FDA approved for NASAL specimens only), is one component of a comprehensive MRSA colonization surveillance program. It is not intended to diagnose MRSA infection nor to guide or monitor treatment for MRSA infections. Test performance is not FDA approved in patients less than 22 years old. Performed at [facility], [address], [place], [place] [address]   Gastrointestinal Panel by PCR , Stool     Status: Abnormal   Collection Time: 10/26/22  8:24 AM   Specimen: Stool  Result Value Ref Range Status   Campylobacter species NOT DETECTED NOT DETECTED Final   Plesimonas shigelloides NOT DETECTED NOT DETECTED Final   Salmonella species NOT DETECTED NOT DETECTED Final   Yersinia enterocolitica NOT DETECTED NOT DETECTED Final   Vibrio species NOT DETECTED NOT DETECTED Final   Vibrio cholerae NOT DETECTED NOT DETECTED Final   Enteroaggregative E coli (EAEC) NOT DETECTED NOT DETECTED Final   Enteropathogenic E coli (EPEC) NOT DETECTED NOT DETECTED Final   Enterotoxigenic E coli (ETEC) NOT DETECTED NOT DETECTED Final   Shiga like toxin producing E coli (STEC) NOT DETECTED NOT DETECTED Final   Shigella/Enteroinvasive E coli (EIEC) NOT DETECTED NOT DETECTED Final   Cryptosporidium NOT DETECTED NOT DETECTED Final   Cyclospora cayetanensis NOT DETECTED NOT DETECTED Final   Entamoeba histolytica NOT DETECTED NOT DETECTED Final   Giardia lamblia NOT DETECTED NOT DETECTED Final   Adenovirus F40/41 NOT DETECTED NOT DETECTED Final   Astrovirus NOT DETECTED NOT DETECTED Final   Norovirus GI/GII DETECTED (A) NOT DETECTED Final  Comment: RESULT CALLED TO, READ BACK BY AND VERIFIED WITH: SAM OWNLEY 10/26/22 2053 MU    Rotavirus A NOT DETECTED NOT DETECTED Final   Sapovirus (I, II, IV, and V)  NOT DETECTED NOT DETECTED Final    Comment: Performed at [facility], [address] [place], [place] [address]  C Difficile Quick Screen w PCR reflex     Status: None   Collection Time: 10/26/22  8:24 AM   Specimen: STOOL  Result Value Ref Range Status   C Diff antigen NEGATIVE NEGATIVE Final   C Diff toxin NEGATIVE NEGATIVE Final   C Diff interpretation No C. difficile detected.  Final    Comment: Performed at [facility], [address] [place], [place] [address]    RADIOLOGY STUDIES/RESULTS: ECHOCARDIOGRAM LIMITED  Result Date: 10/31/2022    ECHOCARDIOGRAM LIMITED REPORT   Patient Name:   Troy Johnston Date of Exam: 10/31/2022 Medical Rec #:  [id number]        Height:       68.0 in Accession #:    [id number]       Weight:       220.5 lb Date of Birth:  10/29/1950        BSA:          2.130 m Patient Age:    71 years         BP:           127/50 mmHg Patient Gender: M                HR:           69 bpm. Exam Location:  Inpatient Procedure: Limited Echo, Limited Color Doppler and Intracardiac Opacification            Agent Indications:    CHF-Acute Systolic I50.21  History:        Patient has prior history of Echocardiogram examinations, most                 recent 10/26/2022.  Sonographer:    Lucendia Herrlich Referring Phys: [id number] LINDSAY NICOLE FINCH IMPRESSIONS  1. Left ventricular ejection fraction, by estimation, is 40 to 45%. The left ventricle has mildly decreased function. The left ventricle has no regional wall motion abnormalities. There is moderate concentric left ventricular hypertrophy. Left ventricular diastolic parameters are consistent with Grade I diastolic dysfunction (impaired relaxation).  2. Right ventricular systolic function is normal. The right ventricular size is normal.  3. The mitral valve is normal in structure. No evidence of mitral valve regurgitation. No evidence of mitral stenosis.  4. The aortic valve is normal in structure. There is mild  calcification of the aortic valve. Aortic valve regurgitation is not visualized. No aortic stenosis is present.  5. The inferior vena cava is normal in size with greater than 50% respiratory variability, suggesting right atrial pressure of 3 mmHg. FINDINGS  Left Ventricle: Left ventricular ejection fraction, by estimation, is 40 to 45%. The left ventricle has mildly decreased function. The left ventricle has no regional wall motion abnormalities. Definity contrast agent was given IV to delineate the left ventricular endocardial borders. The left ventricular internal cavity size was normal in size. There is moderate concentric left ventricular hypertrophy. Left ventricular diastolic parameters are consistent with Grade I diastolic dysfunction (impaired relaxation). Right Ventricle: The right ventricular size is normal. No increase in right ventricular wall thickness. Right ventricular systolic function is normal. Left Atrium: Left atrial size  was normal in size. Right Atrium: Right atrial size was normal in size. Pericardium: There is no evidence of pericardial effusion. Mitral Valve: The mitral valve is normal in structure. No evidence of mitral valve stenosis. Tricuspid Valve: The tricuspid valve is normal in structure. Tricuspid valve regurgitation is not demonstrated. No evidence of tricuspid stenosis. Aortic Valve: The aortic valve is normal in structure. There is mild calcification of the aortic valve. There is mild aortic valve annular calcification. Aortic valve regurgitation is not visualized. No aortic stenosis is present. Pulmonic Valve: The pulmonic valve was normal in structure. Pulmonic valve regurgitation is not visualized. No evidence of pulmonic stenosis. Aorta: The aortic root is normal in size and structure. Venous: The inferior vena cava is normal in size with greater than 50% respiratory variability, suggesting right atrial pressure of 3 mmHg. IAS/Shunts: No atrial level shunt detected by color  flow Doppler. LEFT VENTRICLE PLAX 2D LVIDd:         5.80 cm LVIDs:         4.80 cm LV PW:         1.00 cm LV IVS:        1.30 cm LVOT diam:     2.20 cm LVOT Area:     3.80 cm  IVC IVC diam: 1.70 cm LEFT ATRIUM         Index LA diam:    4.20 cm 1.97 cm/m   AORTA Ao Root diam: 3.10 cm Ao Asc diam:  2.90 cm TRICUSPID VALVE TR Peak grad:   6.7 mmHg TR Vmax:        129.00 cm/s  SHUNTS Systemic Diam: 2.20 cm Aditya Sabharwal Electronically signed by Dorthula Nettles Signature Date/Time: 10/31/2022/5:34:24 PM    Final      LOS: 8 days   Jeoffrey Massed, MD  [facility]    To contact the attending provider between 7A-7P or the covering provider during after hours 7P-7A, please log into the web site [email or web address] and access using universal [place] password for that web site. If you do not have the password, please call the hospital operator.  11/02/2022, 9:01 AM

## 2022-11-02 NOTE — Progress Notes (Signed)
ANTICOAGULATION CONSULT NOTE - Follow Up  Pharmacy Consult for Heparin Indication: afib  Allergies  Allergen Reactions   Ferrous Sulfate Other (See Comments)   Penicillins Hives and Other (See Comments)    Has patient had a PCN reaction causing immediate rash, facial/tongue/throat swelling, SOB or lightheadedness with hypotension: no Has patient had a PCN reaction causing severe rash involving mucus membranes or skin necrosis: no Has patient had a PCN reaction that required hospitalization: yes Has patient had a PCN reaction occurring within the last 10 years: no If all of the above answers are "NO", then may proceed with Cephalosporin use.    Patient Measurements: Height: 5\' 8"  (172.7 cm) Weight: 100 kg (220 lb 7.4 oz) IBW/kg (Calculated) : 68.4 Heparin Dosing Weight: 88.3 kg  Vital Signs: Temp: 98.5 F (36.9 C) (07/31 0400) Temp Source: Oral (07/31 0400) BP: 142/52 (07/31 0400) Pulse Rate: 69 (07/31 0400)  Labs: Recent Labs    10/31/22 0447 10/31/22 0448 10/31/22 0448 11/01/22 0404 11/02/22 0004  HGB  --  8.3*   < > 8.7* 8.7*  HCT  --  25.9*  --  26.4* 27.2*  PLT  --  106*  --  124* 141*  HEPARINUNFRC 0.34  --   --  0.42 0.46  CREATININE 3.97*  --   --  4.32* 4.63*   < > = values in this interval not displayed.    Estimated Creatinine Clearance: 16.3 mL/min (A) (by C-G formula based on SCr of 4.63 mg/dL (H)).   Medical History: Past Medical History:  Diagnosis Date   Diabetes mellitus without complication (HCC)    GERD (gastroesophageal reflux disease)    Hypertension    Prostate cancer (HCC)     Medications:  Medications Prior to Admission  Medication Sig Dispense Refill Last Dose   allopurinol (ZYLOPRIM) 100 MG tablet Take 50 mg by mouth every other day. For high uric acid level   unk   Alogliptin Benzoate 12.5 MG TABS Take 6.25 mg by mouth every morning. For diabetes (replaces metformin)   unk   atorvastatin (LIPITOR) 20 MG tablet Take 20 mg by mouth  at bedtime. For cholesterol   unk   carvedilol (COREG) 25 MG tablet Take 12.5 mg by mouth 2 (two) times daily with a meal.   un   glipiZIDE (GLUCOTROL) 10 MG tablet Take 5 mg by mouth 2 (two) times daily before a meal. For diabetes   unk   hydrALAZINE (APRESOLINE) 100 MG tablet Take 100 mg by mouth every 8 (eight) hours.   unk   losartan (COZAAR) 100 MG tablet Take 100 mg by mouth daily.   unk   Scheduled:   Chlorhexidine Gluconate Cloth  6 each Topical Daily   docusate sodium  100 mg Oral BID   feeding supplement  237 mL Oral TID BM   insulin aspart  0-15 Units Subcutaneous TID WC   insulin aspart  0-5 Units Subcutaneous QHS   insulin glargine-yfgn  10 Units Subcutaneous Daily   multivitamin  1 tablet Oral QHS   pantoprazole  40 mg Oral QHS   polyethylene glycol  17 g Oral Daily   Infusions:   sodium chloride Stopped (10/30/22 1501)   sodium chloride     heparin 1,600 Units/hr (11/01/22 2318)   PRN: Place/Maintain arterial line **AND** sodium chloride, acetaminophen, docusate sodium, mouth rinse, polyethylene glycol  Assessment: 71 yom with history of DM, prostate cancer remission, PUD and GIB presenting with SOB. Heparin per pharmacy consult  placed for chest pain/ACS - planned 48 hrs of treatment for NSTEMI, but now heparin continuing for new afib. Patient is not on anticoagulation PTA.  Heparin held 7/24 AM after cardiac arrest. Cleared by CCM to restart later in the day 7/24. 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 S/p CRRT 7/25-7/27. Monitoring for renal recovery.  Heparin level 0.46 therapeutic @ 1600 units/hr. Hg low but stable at 8.7. Plt improved to 141. No bleeding or issues with infusion per chart.   Goal of Therapy:  Heparin level 0.3-0.7 units/ml Monitor platelets by anticoagulation protocol: Yes   Plan:  Continue heparin at 1600 units/hr  Monitor daily heparin level/CBC, s/sx bleeding F/u  transition to apixaban as appropriate  Verdene Rio, PharmD PGY1 Pharmacy Resident

## 2022-11-02 NOTE — Progress Notes (Signed)
   11/02/22 1815  Vitals  Temp 97.6 F (36.4 C)  Pulse Rate 61  Resp 11  BP (!) 124/50  SpO2 100 %  Post Treatment  Dialyzer Clearance Clear  Duration of HD Treatment -hour(s) 3 hour(s)  Hemodialysis Intake (mL) 0 mL  Liters Processed 63  Fluid Removed (mL) 1000 mL  Tolerated HD Treatment Yes   Received patient in bed to unit.  Alert and oriented.  Informed consent signed and in chart.   TX duration:3hrs  Patient tolerated well.  Transported back to the room  Alert, without acute distress.  Hand-off given to patient's nurse.   Access used: [facility] Access issues: none  Total UF removed: 1L Medication(s) given: none    Troy Johnston [facility]

## 2022-11-02 NOTE — Plan of Care (Signed)
  Problem: Education: Goal: Knowledge of General Education information will improve Description: Including pain rating scale, medication(s)/side effects and non-pharmacologic comfort measures Outcome: Progressing   Problem: Health Behavior/Discharge Planning: Goal: Ability to manage health-related needs will improve Outcome: Progressing   Problem: Clinical Measurements: Goal: Ability to maintain clinical measurements within normal limits will improve Outcome: Progressing Goal: Will remain free from infection Outcome: Progressing Goal: Diagnostic test results will improve Outcome: Progressing Goal: Respiratory complications will improve Outcome: Progressing Goal: Cardiovascular complication will be avoided Outcome: Progressing   Problem: Activity: Goal: Risk for activity intolerance will decrease Outcome: Progressing   Problem: Nutrition: Goal: Adequate nutrition will be maintained Outcome: Progressing   Problem: Coping: Goal: Level of anxiety will decrease Outcome: Progressing   Problem: Elimination: Goal: Will not experience complications related to bowel motility Outcome: Progressing Goal: Will not experience complications related to urinary retention Outcome: Progressing   Problem: Pain Managment: Goal: General experience of comfort will improve Outcome: Progressing   Problem: Safety: Goal: Ability to remain free from injury will improve Outcome: Progressing   Problem: Skin Integrity: Goal: Risk for impaired skin integrity will decrease Outcome: Progressing   Problem: Education: Goal: Ability to describe self-care measures that may prevent or decrease complications (Diabetes Survival Skills Education) will improve Outcome: Progressing Goal: Individualized Educational Video(s) Outcome: Progressing   Problem: Coping: Goal: Ability to adjust to condition or change in health will improve Outcome: Progressing   Problem: Fluid Volume: Goal: Ability to  maintain a balanced intake and output will improve Outcome: Progressing   Problem: Health Behavior/Discharge Planning: Goal: Ability to identify and utilize available resources and services will improve Outcome: Progressing Goal: Ability to manage health-related needs will improve Outcome: Progressing   Problem: Metabolic: Goal: Ability to maintain appropriate glucose levels will improve Outcome: Progressing   Problem: Nutritional: Goal: Maintenance of adequate nutrition will improve Outcome: Progressing Goal: Progress toward achieving an optimal weight will improve Outcome: Progressing   Problem: Skin Integrity: Goal: Risk for impaired skin integrity will decrease Outcome: Progressing   Problem: Tissue Perfusion: Goal: Adequacy of tissue perfusion will improve Outcome: Progressing   Problem: Activity: Goal: Ability to tolerate increased activity will improve Outcome: Progressing   Problem: Respiratory: Goal: Ability to maintain a clear airway and adequate ventilation will improve Outcome: Progressing   Problem: Role Relationship: Goal: Method of communication will improve Outcome: Progressing   

## 2022-11-03 DIAGNOSIS — A419 Sepsis, unspecified organism: Secondary | ICD-10-CM | POA: Diagnosis not present

## 2022-11-03 DIAGNOSIS — I469 Cardiac arrest, cause unspecified: Secondary | ICD-10-CM | POA: Diagnosis not present

## 2022-11-03 DIAGNOSIS — J9601 Acute respiratory failure with hypoxia: Secondary | ICD-10-CM | POA: Diagnosis not present

## 2022-11-03 DIAGNOSIS — N189 Chronic kidney disease, unspecified: Secondary | ICD-10-CM | POA: Diagnosis not present

## 2022-11-03 DIAGNOSIS — N179 Acute kidney failure, unspecified: Secondary | ICD-10-CM | POA: Diagnosis not present

## 2022-11-03 LAB — GLUCOSE, CAPILLARY
Glucose-Capillary: 115 mg/dL — ABNORMAL HIGH (ref 70–99)
Glucose-Capillary: 130 mg/dL — ABNORMAL HIGH (ref 70–99)
Glucose-Capillary: 146 mg/dL — ABNORMAL HIGH (ref 70–99)
Glucose-Capillary: 148 mg/dL — ABNORMAL HIGH (ref 70–99)
Glucose-Capillary: 168 mg/dL — ABNORMAL HIGH (ref 70–99)
Glucose-Capillary: 195 mg/dL — ABNORMAL HIGH (ref 70–99)

## 2022-11-03 LAB — HEPARIN LEVEL (UNFRACTIONATED): Heparin Unfractionated: 0.49 IU/mL (ref 0.30–0.70)

## 2022-11-03 NOTE — Progress Notes (Signed)
[facility] Progress Note   Assessment/ Plan:   Troy Johnston is a/an 72 y.o. male with a past medical history significant for HTN, DM2, CKD, admitted for septic shock and enterobacter bacteremia.         AKI on CKD IV:  likely hemodynamically mediated in the setting of shock.              - started CRRT on 7/25 and stopped on 7/27             -Temporary dialysis catheter in place, appreciate help from CCM             -IV Lasix trial 7/28, did well and Cr plateaued--however Cr has been inching up day by day  - IV Lasix 7/30  - HD clearance 7/31  - likely will need to convert [facility] to tunneled, anticipate some ongoing HD needs at least for now- ordered  - HD tomorrow 8/2   2.  Shock: septic with enterobacter bacteremia             - abx per primary             - TTE no obvious vegetation              -No longer requiring pressors    3.  Elevated troponins/ NSTEMI/PEA arrest/Afib w/ RVR             - cardiology following             - anticoagulation per primary   4.  Acute hypoxic RF:             - now extubated and much improved   5.  Elevated LFTs; improving             - most likely d/t tissue ischemia   6.  Thrombocytopenia: stable             - likely in setting of septic shock  Subjective:    HD yesterday, did well.  Better mentation today.     Objective:   BP (!) 126/46 (BP Location: Right Arm)   Pulse 64   Temp 99.7 F (37.6 C) (Oral)   Resp 18   Ht 5\' 8"  (1.727 m)   Wt 102.2 kg   SpO2 93%   BMI 34.26 kg/m   Intake/Output Summary (Last 24 hours) at 11/03/2022 1135 Last data filed at 11/03/2022 0915 Gross per 24 hour  Intake --  Output 2750 ml  Net -2750 ml   Weight change:   Physical Exam: Gen:NAD CVS: RRR Resp: muffled at bases Abd: soft Ext: 1+ LE edema ACCESS: R internal jugular nontunneled HD cath  Imaging: No results found.  Labs: BMET Recent Labs  Lab 10/28/22 0433 10/28/22 1602 10/29/22 0330 10/29/22 1648  10/30/22 0500 10/31/22 0447 11/01/22 0404 11/02/22 0004 11/03/22 0840  NA 135 136 132* 133* 136 135 135 135 132*  K 3.9 3.9 3.6 3.2* 3.6 3.3* 3.8 4.1 3.7  CL 100 100 99 101 103 101 100 99 97*  CO2 20* 21* 24 24 22 23 23 24 28   GLUCOSE 163* 213* 159* 173* 122* 192* 153* 168* 121*  BUN 80* 68* 54* 55* 68* 81* 87* 86* 47*  CREATININE 4.67* 3.71* 3.16* 3.24* 3.93* 3.97* 4.32* 4.63* 3.52*  CALCIUM 7.2* 7.4* 7.1* 7.1* 7.3* 7.5* 7.7* 7.8* 7.5*  PHOS 3.8 2.9 2.0* 2.2* 3.5 4.2 4.6  --   --    CBC  Recent Labs  Lab 10/31/22 0448 11/01/22 0404 11/02/22 0004 11/03/22 0840  WBC 16.6* 14.9* 9.1 6.1  HGB 8.3* 8.7* 8.7* 8.1*  HCT 25.9* 26.4* 27.2* 25.5*  MCV 82.2 83.3 85.0 84.2  PLT 106* 124* 141* 169    Medications:     Chlorhexidine Gluconate Cloth  6 each Topical Daily   Chlorhexidine Gluconate Cloth  6 each Topical Q0600   docusate sodium  100 mg Oral BID   feeding supplement  237 mL Oral TID BM   insulin aspart  0-15 Units Subcutaneous TID WC   insulin aspart  0-5 Units Subcutaneous QHS   insulin glargine-yfgn  10 Units Subcutaneous Daily   multivitamin  1 tablet Oral QHS   pantoprazole  40 mg Oral QHS   polyethylene glycol  17 g Oral Daily   Bufford Buttner MD 11/03/2022, 11:35 AM

## 2022-11-03 NOTE — Consult Note (Signed)
Chief Complaint: Patient was seen in consultation today for  Chief Complaint  Patient presents with   Shortness of Breath   Referring Physician(s): Dr. Signe Colt   Supervising Physician: Malachy Moan  Patient Status: [facility] - In-pt  History of Present Illness: Troy Johnston is a 72 y.o. male with a medical history significant for DM, HTN, chronic kidney disease and prostate cancer. He presented to the ED 10/25/22 with shortness of breath and was found to be in septic shock with AKI, 2 blood cx positive with enterobacterales and enterobacter cloace complex. He was admitted to the ICU and a temporary dialysis catheter was placed by PCCM on 10/26/22. He was started on CRRT and this was stopped 10/29/22. He has not regained his kidney function and the nephrology team is anticipating long term hemodialysis.   Interventional Radiology has been asked to evaluate this patient for an image-guided temp to tunneled dialysis catheter placement. Discussed with Dr. Jerral Ralph regarding need for repeat blood cx before tunneled line placement, per Dr. Jerral Ralph patient had gram negative bacteremia and completed abx, no need for repeat blood cx.   IR will proceed with tunneled HD cath placement tomorrow.  Patient laying in bed, not in acute distress.  Denise headache, fever, chills, shortness of breath, cough, chest pain, abdominal pain, nausea ,vomiting, and bleeding.  Patient currently has DNR order in place. Discussion with the patient and family regarding wishes.  The DNR order is rescinded during the procedure and the patient consents to the use of any resuscitation procedure needed to treat the clinical events that occur.   Past Medical History:  Diagnosis Date   Diabetes mellitus without complication (HCC)    GERD (gastroesophageal reflux disease)    Hypertension    Prostate cancer (HCC)     No past surgical history on file.  Allergies: Ferrous sulfate and  Penicillins  Medications: Prior to Admission medications   Medication Sig Start Date End Date Taking? Authorizing Provider  allopurinol (ZYLOPRIM) 100 MG tablet Take 50 mg by mouth every other day. For high uric acid level   Yes [provider]  Alogliptin Benzoate 12.5 MG TABS Take 6.25 mg by mouth every morning. For diabetes (replaces metformin)   Yes [provider]  atorvastatin (LIPITOR) 20 MG tablet Take 20 mg by mouth at bedtime. For cholesterol   Yes [provider]  carvedilol (COREG) 25 MG tablet Take 12.5 mg by mouth 2 (two) times daily with a meal. 07/08/22  Yes [provider]  glipiZIDE (GLUCOTROL) 10 MG tablet Take 5 mg by mouth 2 (two) times daily before a meal. For diabetes   Yes [provider]  hydrALAZINE (APRESOLINE) 100 MG tablet Take 100 mg by mouth every 8 (eight) hours.   Yes [provider]  losartan (COZAAR) 100 MG tablet Take 100 mg by mouth daily.   Yes [provider]     No family history on file.  Social History   Socioeconomic History   Marital status: Single    Spouse name: Not on file   Number of children: Not on file   Years of education: Not on file   Highest education level: Not on file  Occupational History   Not on file  Tobacco Use   Smoking status: Never    Passive exposure: Never   Smokeless tobacco: Never  Vaping Use   Vaping status: Never Used  Substance and Sexual Activity   Alcohol use: No   Drug use: No  Sexual activity: Not on file  Other Topics Concern   Not on file  Social History Narrative   Not on file   Social Determinants of Health   Financial Resource Strain: Low Risk  (10/27/2022)   Overall Financial Resource Strain (CARDIA)    Difficulty of Paying Living Expenses: Not hard at all  Food Insecurity: No Food Insecurity (10/27/2022)   Hunger Vital Sign    Worried About Running Out of Food in the Last Year: Never true    Ran Out of Food in the Last Year:  Never true  Transportation Needs: No Transportation Needs (10/27/2022)   PRAPARE - Administrator, Civil Service (Medical): No    Lack of Transportation (Non-Medical): No  Physical Activity: Not on file  Stress: Not on file  Social Connections: Not on file    Review of Systems: A 12 point ROS discussed and pertinent positives are indicated in the HPI above.  All other systems are negative.  Review of Systems  Vital Signs: BP 119/69 (BP Location: Right Arm)   Pulse 64   Temp 99.3 F (37.4 C) (Oral)   Resp 18   Ht 5\' 8"  (1.727 m)   Wt 225 lb 5 oz (102.2 kg)   SpO2 93%   BMI 34.26 kg/m   Physical Exam Vitals reviewed.  Constitutional:      General: He is not in acute distress.    Appearance: He is not ill-appearing.  HENT:     Head: Normocephalic.  Neck:     Comments: + R internal jugular temp cath  Cardiovascular:     Rate and Rhythm: Normal rate and regular rhythm.  Pulmonary:     Effort: Pulmonary effort is normal.     Breath sounds: Normal breath sounds.  Abdominal:     General: Bowel sounds are normal.     Palpations: Abdomen is soft.  Skin:    General: Skin is warm and dry.     Coloration: Skin is not cyanotic or pale.  Neurological:     Mental Status: He is alert and oriented to person, place, and time.  Psychiatric:        Mood and Affect: Mood normal.        Behavior: Behavior normal.     Imaging: ECHOCARDIOGRAM LIMITED  Result Date: 10/31/2022    ECHOCARDIOGRAM LIMITED REPORT   Patient Name:   Troy Johnston Date of Exam: 10/31/2022 Medical Rec #:  [id number]        Height:       68.0 in Accession #:    [id number]       Weight:       220.5 lb Date of Birth:  07-24-1950        BSA:          2.130 m Patient Age:    71 years         BP:           127/50 mmHg Patient Gender: M                HR:           69 bpm. Exam Location:  Inpatient Procedure: Limited Echo, Limited Color Doppler and Intracardiac Opacification            Agent Indications:     CHF-Acute Systolic I50.21  History:        Patient has prior history of Echocardiogram examinations, most  recent 10/26/2022.  Sonographer:    Lucendia Herrlich Referring Phys: [id number] LINDSAY NICOLE FINCH IMPRESSIONS  1. Left ventricular ejection fraction, by estimation, is 40 to 45%. The left ventricle has mildly decreased function. The left ventricle has no regional wall motion abnormalities. There is moderate concentric left ventricular hypertrophy. Left ventricular diastolic parameters are consistent with Grade I diastolic dysfunction (impaired relaxation).  2. Right ventricular systolic function is normal. The right ventricular size is normal.  3. The mitral valve is normal in structure. No evidence of mitral valve regurgitation. No evidence of mitral stenosis.  4. The aortic valve is normal in structure. There is mild calcification of the aortic valve. Aortic valve regurgitation is not visualized. No aortic stenosis is present.  5. The inferior vena cava is normal in size with greater than 50% respiratory variability, suggesting right atrial pressure of 3 mmHg. FINDINGS  Left Ventricle: Left ventricular ejection fraction, by estimation, is 40 to 45%. The left ventricle has mildly decreased function. The left ventricle has no regional wall motion abnormalities. Definity contrast agent was given IV to delineate the left ventricular endocardial borders. The left ventricular internal cavity size was normal in size. There is moderate concentric left ventricular hypertrophy. Left ventricular diastolic parameters are consistent with Grade I diastolic dysfunction (impaired relaxation). Right Ventricle: The right ventricular size is normal. No increase in right ventricular wall thickness. Right ventricular systolic function is normal. Left Atrium: Left atrial size was normal in size. Right Atrium: Right atrial size was normal in size. Pericardium: There is no evidence of pericardial effusion. Mitral Valve:  The mitral valve is normal in structure. No evidence of mitral valve stenosis. Tricuspid Valve: The tricuspid valve is normal in structure. Tricuspid valve regurgitation is not demonstrated. No evidence of tricuspid stenosis. Aortic Valve: The aortic valve is normal in structure. There is mild calcification of the aortic valve. There is mild aortic valve annular calcification. Aortic valve regurgitation is not visualized. No aortic stenosis is present. Pulmonic Valve: The pulmonic valve was normal in structure. Pulmonic valve regurgitation is not visualized. No evidence of pulmonic stenosis. Aorta: The aortic root is normal in size and structure. Venous: The inferior vena cava is normal in size with greater than 50% respiratory variability, suggesting right atrial pressure of 3 mmHg. IAS/Shunts: No atrial level shunt detected by color flow Doppler. LEFT VENTRICLE PLAX 2D LVIDd:         5.80 cm LVIDs:         4.80 cm LV PW:         1.00 cm LV IVS:        1.30 cm LVOT diam:     2.20 cm LVOT Area:     3.80 cm  IVC IVC diam: 1.70 cm LEFT ATRIUM         Index LA diam:    4.20 cm 1.97 cm/m   AORTA Ao Root diam: 3.10 cm Ao Asc diam:  2.90 cm TRICUSPID VALVE TR Peak grad:   6.7 mmHg TR Vmax:        129.00 cm/s  SHUNTS Systemic Diam: 2.20 cm Troy Johnston Electronically signed by Dorthula Nettles Signature Date/Time: 10/31/2022/5:34:24 PM    Final    DG Abd 1 View  Result Date: 10/26/2022 CLINICAL DATA:  Check feeding catheter placement EXAM: ABDOMEN - 1 VIEW COMPARISON:  None Available. FINDINGS: Gastric catheter is noted within the stomach. No free air is seen. No obstructive changes are noted. IMPRESSION: Gastric catheter within the stomach. Electronically  Signed   By: Alcide Clever M.D.   On: 10/26/2022 21:44   ECHOCARDIOGRAM COMPLETE  Result Date: 10/26/2022    ECHOCARDIOGRAM REPORT   Patient Name:   Troy Johnston Date of Exam: 10/26/2022 Medical Rec #:  [id number]        Height:       68.0 in Accession #:     [id number]       Weight:       222.9 lb Date of Birth:  1951/01/26        BSA:          2.140 m Patient Age:    71 years         BP:           110/79 mmHg Patient Gender: M                HR:           103 bpm. Exam Location:  Inpatient Procedure: 2D Echo, Cardiac Doppler and Color Doppler STAT ECHO         REPORT CONTAINS CRITICAL RESULT  Findings communicated to Dr. Servando Salina and Dr. [name]. Indications:    R00.9* Unspecified abnormalities of heart beat. Cardiac arrest  History:        Patient has no prior history of Echocardiogram examinations.                 Abnormal ECG; Arrythmias:Cardiac Arrest.  Sonographer:    Sheralyn Boatman RDCS Referring Phys: [id number] RAKESH V ALVA  Sonographer Comments: Patient is obese and echo performed with patient supine and on artificial respirator. Dr. [name] present in room. Stat echo delayed. Patient was not stable. IMPRESSIONS  1. Left ventricular ejection fraction, by estimation, is 20 to 25%. The left ventricle has severely decreased function. The left ventricle demonstrates global hypokinesis. There is moderate concentric left ventricular hypertrophy. Left ventricular diastolic function could not be evaluated.  2. Right ventricular systolic function is severely reduced. The right ventricular size is normal. Tricuspid regurgitation signal is inadequate for assessing PA pressure.  3. Left atrial size was mildly dilated.  4. Right atrial size was mildly dilated.  5. The mitral valve is normal in structure. Trivial mitral valve regurgitation. No evidence of mitral stenosis.  6. The aortic valve is tricuspid. There is mild calcification of the aortic valve. There is mild thickening of the aortic valve. Aortic valve regurgitation is not visualized. No aortic stenosis is present. Comparison(s): No prior Echocardiogram. Conclusion(s)/Recommendation(s): Global ventricular dysfunction with normal ventricular sizes, no significant valve disease. FINDINGS  Left Ventricle: Left ventricular ejection  fraction, by estimation, is 20 to 25%. The left ventricle has severely decreased function. The left ventricle demonstrates global hypokinesis. The left ventricular internal cavity size was normal in size. There is moderate concentric left ventricular hypertrophy. Left ventricular diastolic function could not be evaluated due to atrial fibrillation. Left ventricular diastolic function could not be evaluated. Right Ventricle: The right ventricular size is normal. No increase in right ventricular wall thickness. Right ventricular systolic function is severely reduced. Tricuspid regurgitation signal is inadequate for assessing PA pressure. Left Atrium: Left atrial size was mildly dilated. Right Atrium: Right atrial size was mildly dilated. Pericardium: Trivial pericardial effusion is present. Mitral Valve: The mitral valve is normal in structure. Trivial mitral valve regurgitation. No evidence of mitral valve stenosis. Tricuspid Valve: The tricuspid valve is normal in structure. Tricuspid valve regurgitation is trivial. No evidence of tricuspid stenosis. Aortic Valve: The aortic  valve is tricuspid. There is mild calcification of the aortic valve. There is mild thickening of the aortic valve. Aortic valve regurgitation is not visualized. No aortic stenosis is present. Pulmonic Valve: The pulmonic valve was not well visualized. Pulmonic valve regurgitation is trivial. No evidence of pulmonic stenosis. Aorta: The aortic root and ascending aorta are structurally normal, with no evidence of dilitation. Venous: IVC assessment for right atrial pressure unable to be performed due to mechanical ventilation. IAS/Shunts: The atrial septum is grossly normal.  LEFT VENTRICLE PLAX 2D LVIDd:         4.20 cm LVIDs:         3.40 cm LV PW:         1.55 cm LV IVS:        1.75 cm LVOT diam:     2.20 cm LV SV:         42 LV SV Index:   20 LVOT Area:     3.80 cm  LV Volumes (MOD) LV vol d, MOD A4C: 84.7 ml LV vol s, MOD A4C: 67.4 ml LV SV  MOD A4C:     84.7 ml RIGHT VENTRICLE         IVC TAPSE (M-mode): 0.8 cm  IVC diam: 2.10 cm LEFT ATRIUM             Index        RIGHT ATRIUM           Index LA diam:        3.90 cm 1.82 cm/m   RA Area:     19.70 cm LA Vol (A2C):   76.7 ml 35.84 ml/m  RA Volume:   50.90 ml  23.78 ml/m LA Vol (A4C):   64.6 ml 30.19 ml/m LA Biplane Vol: 72.3 ml 33.78 ml/m  AORTIC VALVE LVOT Vmax:   92.20 cm/s LVOT Vmean:  58.533 cm/s LVOT VTI:    0.111 m  AORTA Ao Root diam: 2.95 cm Ao Asc diam:  3.30 cm MITRAL VALVE MV Area (PHT): 5.13 cm    SHUNTS MV Decel Time: 148 msec    Systemic VTI:  0.11 m MV E velocity: 64.50 cm/s  Systemic Diam: 2.20 cm Jodelle Red MD Electronically signed by Jodelle Red MD Signature Date/Time: 10/26/2022/10:56:27 AM    Final    Portable Chest x-ray  Result Date: 10/26/2022 CLINICAL DATA:  Intubation EXAM: PORTABLE CHEST 1 VIEW COMPARISON:  10/26/2022 FINDINGS: Interval placement of endotracheal tube with distal tip tube terminating approximately 4.0 cm above the carina. Right IJ central venous catheter remains positioned in the SVC. Stable cardiomegaly. Pulmonary vascular congestion. Similar bilateral interstitial prominence. No pneumothorax. IMPRESSION: Interval placement of endotracheal tube with distal tip tube terminating approximately 4.0 cm above the carina. Electronically Signed   By: Duanne Guess D.O.   On: 10/26/2022 10:40   DG CHEST PORT 1 VIEW  Result Date: 10/26/2022 CLINICAL DATA:  72 year old male status post central line placement. EXAM: PORTABLE CHEST 1 VIEW COMPARISON:  Chest x-ray 10/25/2022. FINDINGS: Right internal jugular central venous catheter with tip terminating in the distal superior vena cava. Lung volumes are low. Probable bibasilar subsegmental atelectasis. Possible trace left pleural effusion. No pneumothorax. No definite right pleural effusion. Mild cephalization of the pulmonary vasculature, without frank pulmonary edema. Mild  cardiomegaly. Upper mediastinal contours are distorted by patient's rotation to the left. IMPRESSION: 1. Support apparatus, as above. 2. Low lung volumes with probable bibasilar subsegmental atelectasis and trace left pleural effusion. 3. Cardiomegaly  with pulmonary venous congestion, but no frank pulmonary edema. Electronically Signed   By: Trudie Reed M.D.   On: 10/26/2022 06:07   US RENAL  Result Date: 10/25/2022 CLINICAL DATA:  Acute kidney injury. EXAM: RENAL / URINARY TRACT ULTRASOUND COMPLETE COMPARISON:  None Available. FINDINGS: Right Kidney: Renal measurements: 11.1 cm x 6.1 cm x 4.6 cm = volume: 164.38 mL. Echogenicity within normal limits. There is mild right-sided hydronephrosis. A 4.1 cm x 3.0 cm x 3.6 cm simple cyst is seen within the lower pole of the right kidney. Left Kidney: Renal measurements: 10.1 cm x 5.1 cm x 4.1 cm = volume: 112.15 mL. Echogenicity within normal limits. No mass or hydronephrosis visualized. Bladder: The urinary bladder is poorly visualized. Other: None. IMPRESSION: 1. Large, simple right renal cyst. 2. Mild right-sided hydronephrosis. Electronically Signed   By: Aram Candela M.D.   On: 10/25/2022 20:11   DG Chest Port 1 View  Result Date: 10/25/2022 CLINICAL DATA:  Shortness of breath. EXAM: PORTABLE CHEST 1 VIEW COMPARISON:  11/14/2019. FINDINGS: Enlarged cardiac silhouette. Low lung volume study with possible retrocardiac opacities. No visible pleural effusions or pneumothorax. No acute bony abnormality. Left shoulder osteoarthritis. IMPRESSION: 1. Low lung volume study with possible retrocardiac opacities, most likely atelectasis. If there is concern for infection, dedicated PA and lateral radiographs could better assess. 2. Cardiomegaly. Electronically Signed   By: Feliberto Harts M.D.   On: 10/25/2022 16:36    Labs:  CBC: Recent Labs    10/31/22 0448 11/01/22 0404 11/02/22 0004 11/03/22 0840  WBC 16.6* 14.9* 9.1 6.1  HGB 8.3* 8.7* 8.7*  8.1*  HCT 25.9* 26.4* 27.2* 25.5*  PLT 106* 124* 141* 169    COAGS: No results for input(s): "INR", "APTT" in the last 8760 hours.  BMP: Recent Labs    10/31/22 0447 11/01/22 0404 11/02/22 0004 11/03/22 0840  NA 135 135 135 132*  K 3.3* 3.8 4.1 3.7  CL 101 100 99 97*  CO2 23 23 24 28   GLUCOSE 192* 153* 168* 121*  BUN 81* 87* 86* 47*  CALCIUM 7.5* 7.7* 7.8* 7.5*  CREATININE 3.97* 4.32* 4.63* 3.52*  GFRNONAA 15* 14* 13* 18*    LIVER FUNCTION TESTS: Recent Labs    10/27/22 0308 10/27/22 1614 10/28/22 0433 10/28/22 1602 10/29/22 0330 10/29/22 1648 10/30/22 0500 10/31/22 0447 11/01/22 0404 11/03/22 0840  BILITOT 1.5*  --  1.9*  --  1.9*  --   --   --   --  0.9  AST 906*  --  445*  --  327*  --   --   --   --  36  ALT 646*  --  601*  --  536*  --   --   --   --  98*  ALKPHOS 123  --  104  --  129*  --   --   --   --  81  PROT 5.6*  --  5.9*  --  5.5*  --   --   --   --  6.1*  ALBUMIN 2.0*   < > 2.0*  1.9*   < > 1.9*   < > 1.8* 2.1* 2.4* 2.2*   < > = values in this interval not displayed.    TUMOR MARKERS: No results for input(s): "AFPTM", "CEA", "CA199", "CHROMGRNA" in the last 8760 hours.  Assessment and Plan:  AKI on CKD IV; ongoing hemodialysis needs: Macon Large, 72 year old male, is tentatively scheduled 11/04/22 for  an image-guided temporary dialysis catheter conversion to a tunneled dialysis catheter.   Risks and benefits discussed with the patient including, but not limited to bleeding, infection, vascular injury, pneumothorax which may require chest tube placement, air embolism or even death.   All of the patient's questions were answered, patient is agreeable to proceed. He will be NPO at midnight.   Consent signed and in chart.  Thank you for this interesting consult.  I greatly enjoyed meeting Troy Johnston and look forward to participating in their care.  A copy of this report was sent to the requesting provider on this  date.  Electronically Signed: Lawernce Ion PA-C [phone] 11/03/2022, 1:47 PM   I spent a total of 20 Minutes    in face to face in clinical consultation, greater than 50% of which was counseling/coordinating care for temp to tunneled dialysis catheter placement.

## 2022-11-03 NOTE — Progress Notes (Signed)
ANTICOAGULATION CONSULT NOTE - Follow Up  Pharmacy Consult for Heparin Indication: afib  Allergies  Allergen Reactions   Ferrous Sulfate Other (See Comments)   Penicillins Hives and Other (See Comments)    Has patient had a PCN reaction causing immediate rash, facial/tongue/throat swelling, SOB or lightheadedness with hypotension: no Has patient had a PCN reaction causing severe rash involving mucus membranes or skin necrosis: no Has patient had a PCN reaction that required hospitalization: yes Has patient had a PCN reaction occurring within the last 10 years: no If all of the above answers are "NO", then may proceed with Cephalosporin use.    Patient Measurements: Height: 5\' 8"  (172.7 cm) Weight: 102.2 kg (225 lb 5 oz) IBW/kg (Calculated) : 68.4 Heparin Dosing Weight: 88.3 kg  Vital Signs: Temp: 99.7 F (37.6 C) (08/01 0806) Temp Source: Oral (08/01 0806) BP: 126/46 (08/01 0806) Pulse Rate: 64 (08/01 0400)  Labs: Recent Labs    11/01/22 0404 11/02/22 0004 11/02/22 0910 11/03/22 0840 11/03/22 0842  HGB 8.7* 8.7*  --  8.1*  --   HCT 26.4* 27.2*  --  25.5*  --   PLT 124* 141*  --  169  --   HEPARINUNFRC 0.42 0.46 0.57  --  0.49  CREATININE 4.32* 4.63*  --  3.52*  --     Estimated Creatinine Clearance: 21.7 mL/min (A) (by C-G formula based on SCr of 3.52 mg/dL (H)).   Medical History: Past Medical History:  Diagnosis Date   Diabetes mellitus without complication (HCC)    GERD (gastroesophageal reflux disease)    Hypertension    Prostate cancer (HCC)     Medications:  Medications Prior to Admission  Medication Sig Dispense Refill Last Dose   allopurinol (ZYLOPRIM) 100 MG tablet Take 50 mg by mouth every other day. For high uric acid level   unk   Alogliptin Benzoate 12.5 MG TABS Take 6.25 mg by mouth every morning. For diabetes (replaces metformin)   unk   atorvastatin (LIPITOR) 20 MG tablet Take 20 mg by mouth at bedtime. For cholesterol   unk   carvedilol  (COREG) 25 MG tablet Take 12.5 mg by mouth 2 (two) times daily with a meal.   un   glipiZIDE (GLUCOTROL) 10 MG tablet Take 5 mg by mouth 2 (two) times daily before a meal. For diabetes   unk   hydrALAZINE (APRESOLINE) 100 MG tablet Take 100 mg by mouth every 8 (eight) hours.   unk   losartan (COZAAR) 100 MG tablet Take 100 mg by mouth daily.   unk   Scheduled:   Chlorhexidine Gluconate Cloth  6 each Topical Daily   Chlorhexidine Gluconate Cloth  6 each Topical Q0600   docusate sodium  100 mg Oral BID   feeding supplement  237 mL Oral TID BM   insulin aspart  0-15 Units Subcutaneous TID WC   insulin aspart  0-5 Units Subcutaneous QHS   insulin glargine-yfgn  10 Units Subcutaneous Daily   multivitamin  1 tablet Oral QHS   pantoprazole  40 mg Oral QHS   polyethylene glycol  17 g Oral Daily   Infusions:   sodium chloride Stopped (10/30/22 1501)   sodium chloride     heparin 1,600 Units/hr (11/03/22 0654)   PRN: Place/Maintain arterial line **AND** sodium chloride, acetaminophen, docusate sodium, mouth rinse, polyethylene glycol  Assessment: 71 yom with history of DM, prostate cancer remission, PUD and GIB presenting with SOB. Heparin per pharmacy consult placed for chest pain/ACS -  planned 48 hrs of treatment for NSTEMI, but now heparin continuing for new afib. Patient is not on anticoagulation PTA.  Heparin held 7/24 AM after cardiac arrest. Cleared by CCM to restart later in the day 7/24. 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 S/p CRRT 7/25-7/27. Monitoring for renal recovery.  Heparin level 0.49 therapeutic @ 1600 units/hr. Hg low but stable at 8.1. Plt stable. No bleeding or issues with infusion per chart.   Goal of Therapy:  Heparin level 0.3-0.7 units/ml Monitor platelets by anticoagulation protocol: Yes   Plan:  Continue heparin at 1600 units/hr  Monitor daily heparin level/CBC, s/sx bleeding F/u  transition to apixaban as appropriate  Verdene Rio, PharmD PGY1 Pharmacy Resident

## 2022-11-03 NOTE — Progress Notes (Signed)
Advanced Heart Failure Rounding Note  PCP-Cardiologist: None   Subjective:    Extubated 7/25 Afib >> SR 07/26 CRRT stopped 07/27 IV Lasix trial 7/28, did well and Cr plateaued--however Cr has been inching up day by day. Nonoliguric but continues to require iHD for clearance. Nephrology anticipates will c/w ongoing HD needs at least for now  Limited Echo 7/29, EF improved up to 40-45%, RV normal   Tolerating iHD w/o significant hypotension. SPBs range 110s-130s. HR 50s-60s. Not on any current GDMT.   Feels well. Denies CP. No dyspnea.   Objective:   Weight Range: 102.2 kg Body mass index is 34.26 kg/m.   Vital Signs:   Temp:  [97.6 F (36.4 C)-99.7 F (37.6 C)] 99.7 F (37.6 C) (08/01 0806) Pulse Rate:  [53-67] 64 (08/01 0400) Resp:  [11-21] 18 (08/01 0400) BP: (98-142)/(38-64) 126/46 (08/01 0806) SpO2:  [93 %-100 %] 93 % (08/01 0400) Weight:  [102.2 kg] 102.2 kg (08/01 0500) Last BM Date : 11/01/22  Weight change: Filed Weights   10/30/22 0500 10/31/22 0500 11/03/22 0500  Weight: 101.7 kg 100 kg 102.2 kg    Intake/Output:   Intake/Output Summary (Last 24 hours) at 11/03/2022 1159 Last data filed at 11/03/2022 0915 Gross per 24 hour  Intake --  Output 2750 ml  Net -2750 ml      Physical Exam    General:  Well appearing. No respiratory difficulty HEENT: normal Neck: supple. no JVD. + rt internal jugular HD cath. Carotids 2+ bilat; no bruits. No lymphadenopathy or thyromegaly appreciated. Cor: PMI nondisplaced. Regular rate & rhythm. No rubs, gallops or murmurs. Lungs: clear Abdomen: soft, nontender, nondistended. No hepatosplenomegaly. No bruits or masses. Good bowel sounds. Extremities: no cyanosis, clubbing, rash, edema Neuro: alert & oriented x 3, cranial nerves grossly intact. moves all 4 extremities w/o difficulty. Affect pleasant.   Telemetry   SR 60s  Labs    CBC Recent Labs    11/02/22 0004 11/03/22 0840  WBC 9.1 6.1  HGB 8.7* 8.1*   HCT 27.2* 25.5*  MCV 85.0 84.2  PLT 141* 169   Basic Metabolic Panel Recent Labs    40/98/11 0404 11/02/22 0004 11/03/22 0840  NA 135 135 132*  K 3.8 4.1 3.7  CL 100 99 97*  CO2 23 24 28   GLUCOSE 153* 168* 121*  BUN 87* 86* 47*  CREATININE 4.32* 4.63* 3.52*  CALCIUM 7.7* 7.8* 7.5*  PHOS 4.6  --   --    Liver Function Tests Recent Labs    11/01/22 0404 11/03/22 0840  AST  --  36  ALT  --  98*  ALKPHOS  --  81  BILITOT  --  0.9  PROT  --  6.1*  ALBUMIN 2.4* 2.2*   No results for input(s): "LIPASE", "AMYLASE" in the last 72 hours. Cardiac Enzymes No results for input(s): "CKTOTAL", "CKMB", "CKMBINDEX", "TROPONINI" in the last 72 hours.  BNP: BNP (last 3 results) Recent Labs    10/25/22 1528  BNP 719.5*    ProBNP (last 3 results) No results for input(s): "PROBNP" in the last 8760 hours.   D-Dimer No results for input(s): "DDIMER" in the last 72 hours.  Hemoglobin A1C No results for input(s): "HGBA1C" in the last 72 hours.  Fasting Lipid Panel No results for input(s): "CHOL", "HDL", "LDLCALC", "TRIG", "CHOLHDL", "LDLDIRECT" in the last 72 hours. Thyroid Function Tests No results for input(s): "TSH", "T4TOTAL", "T3FREE", "THYROIDAB" in the last 72 hours.  Invalid input(s): "  FREET3"  Other results:   Imaging    No results found.   Medications:     Scheduled Medications:  Chlorhexidine Gluconate Cloth  6 each Topical Daily   Chlorhexidine Gluconate Cloth  6 each Topical Q0600   docusate sodium  100 mg Oral BID   feeding supplement  237 mL Oral TID BM   insulin aspart  0-15 Units Subcutaneous TID WC   insulin aspart  0-5 Units Subcutaneous QHS   insulin glargine-yfgn  10 Units Subcutaneous Daily   multivitamin  1 tablet Oral QHS   pantoprazole  40 mg Oral QHS   polyethylene glycol  17 g Oral Daily    Infusions:  sodium chloride Stopped (10/30/22 1501)   sodium chloride     heparin 1,600 Units/hr (11/03/22 0654)    PRN  Medications: Place/Maintain arterial line **AND** sodium chloride, acetaminophen, docusate sodium, mouth rinse, polyethylene glycol    Patient Profile   72 y.o. male with history of DM II, HTN, CKD IIIb/IV, prostate cancer. Admitted with shock, metabolic acidosis and AKI on CKD.    Suffered cardiac arrest and found to have bacteremia and new systolic CHF. Advanced Heart Failure asked to see to assist with managing shock.  Assessment/Plan   Shock -Likely septic + ? 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  -BC + for Enterobacter cloacae. Now on meropenem per CCM. Norovirus in stool. -Echo: EF 20-25%, moderate LVH, RV severely reduced -HS troponin 2,879>2,461>1,284, flat trend. Suspect demand ischemia but does have risk factors for CAD -Resolved. Stable off pressors. Repeat limited echo 7/29 EF up to 40-45%, RV normal   2. 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 Nonoliguric but continues to require iHD for clearance. Nephrology anticipates will c/w ongoing HD needs at least for now - Hold off on GDMT addition for now given improved EF and need to be cautious not to drop BP while waiting for renal recovery  3. Cardiac arrest: -07/23-Became bradycardic and had PEA arrest, CPR with ROSC after 10 minutes. Then had Vfib s/p defibrillation >>Afib.   4. Elevated troponin -> Type II NSTEMI -Peaked at 2,879, flat trend -Suspect demand ischemia as above. Denies any h/o CP    5. AKI on CKD IV - Metabolic acidosis -Scr baseline around 2.4, peaked at 8.7. AKI in setting of shock. -Nephrology following -CRRT stopped 07/27, transitioned to iHD. Nonoliguric but continues to require iHD for clearance. Nephrology anticipates will c/w ongoing HD needs at least for now  6.  Atrial fibrillation  - New -Failed IV amiodarone due to hypotension -Converted to SR on 07/26. Still maintaining SR  -On heparin gtt. Will need switch to DOAC once all procedures completed, (anticipate need for Tunneled HD cath)    7. Elevated LFTs -Likely shock liver - resolved    8. Acute hypoxic respiratory failure -Extubated 7/25 -stable on RA    9. DM II -A1c 7.5% -Per primary   10. Anemia History of iron deficiency -Hgb 15.6>>>8.3 -No obvious source of bleeding -T sat 7%, consider IV iron once infection treated   11. Thrombocytopenia - D/t septic shock - Platelets down to 35K, improved to 169K today     Length of Stay: [address], PA-C  11/03/2022, 11:59 AM  Advanced Heart Failure Team Pager [phone] (M-F; 7a - 5p)  Please contact [facility] for night-coverage after hours (5p -7a ) and weekends on [email or web address]

## 2022-11-03 NOTE — Plan of Care (Signed)
  Problem: Education: Goal: Knowledge of General Education information will improve Description: Including pain rating scale, medication(s)/side effects and non-pharmacologic comfort measures Outcome: Progressing   Problem: Health Behavior/Discharge Planning: Goal: Ability to manage health-related needs will improve Outcome: Progressing   Problem: Clinical Measurements: Goal: Ability to maintain clinical measurements within normal limits will improve Outcome: Progressing Goal: Will remain free from infection Outcome: Progressing Goal: Diagnostic test results will improve Outcome: Progressing Goal: Respiratory complications will improve Outcome: Progressing Goal: Cardiovascular complication will be avoided Outcome: Progressing   Problem: Activity: Goal: Risk for activity intolerance will decrease Outcome: Progressing   Problem: Nutrition: Goal: Adequate nutrition will be maintained Outcome: Progressing   Problem: Coping: Goal: Level of anxiety will decrease Outcome: Progressing   Problem: Elimination: Goal: Will not experience complications related to bowel motility Outcome: Progressing Goal: Will not experience complications related to urinary retention Outcome: Progressing   Problem: Pain Managment: Goal: General experience of comfort will improve Outcome: Progressing   Problem: Safety: Goal: Ability to remain free from injury will improve Outcome: Progressing   Problem: Skin Integrity: Goal: Risk for impaired skin integrity will decrease Outcome: Progressing   Problem: Education: Goal: Ability to describe self-care measures that may prevent or decrease complications (Diabetes Survival Skills Education) will improve Outcome: Progressing Goal: Individualized Educational Video(s) Outcome: Progressing   Problem: Coping: Goal: Ability to adjust to condition or change in health will improve Outcome: Progressing   Problem: Fluid Volume: Goal: Ability to  maintain a balanced intake and output will improve Outcome: Progressing   Problem: Health Behavior/Discharge Planning: Goal: Ability to identify and utilize available resources and services will improve Outcome: Progressing Goal: Ability to manage health-related needs will improve Outcome: Progressing   Problem: Metabolic: Goal: Ability to maintain appropriate glucose levels will improve Outcome: Progressing   Problem: Nutritional: Goal: Maintenance of adequate nutrition will improve Outcome: Progressing Goal: Progress toward achieving an optimal weight will improve Outcome: Progressing   Problem: Skin Integrity: Goal: Risk for impaired skin integrity will decrease Outcome: Progressing   Problem: Tissue Perfusion: Goal: Adequacy of tissue perfusion will improve Outcome: Progressing   Problem: Activity: Goal: Ability to tolerate increased activity will improve Outcome: Progressing   Problem: Respiratory: Goal: Ability to maintain a clear airway and adequate ventilation will improve Outcome: Progressing   Problem: Role Relationship: Goal: Method of communication will improve Outcome: Progressing   

## 2022-11-03 NOTE — Progress Notes (Signed)
Physical Therapy Treatment Patient Details Name: Troy Johnston MRN: [id number] DOB: [birth year] Today's Date: 11/03/2022   History of Present Illness 72 yo male admitted 7/23 with SOB and shock. 7/24 PEA arrest with ROSC after 10 min ACLS, NSTEMI. Intubated 7/24-7/25. 7/25 - 7/27 CRRT. PMhx: T2DM, CKD, HTN, prostate CA    PT Comments  Pt received in supine, agreeable to therapy session and with good participation and tolerance for transfer and gait training with no AD. Pt with increased instability without AD and needing up to minA to correct several mild losses of balance. Plan to trial cane next session with pt LLE brace donned to see if this safe AD for him. Reinforced he needs to use RW and have nursing staff assist for OOB mobiltiy, but would benefit from ambulating to bathroom and in room/hallway more frequently with nursing or family assist. Pt continues to benefit from PT services to progress toward functional mobility goals.     If plan is discharge home, recommend the following: A little help with walking and/or transfers;A little help with bathing/dressing/bathroom;Assistance with cooking/housework;Assist for transportation   Can travel by private vehicle        Equipment Recommendations  None recommended by PT;Other (comment) (Plan to trial cane next session, TBD)    Recommendations for Other Services       Precautions / Restrictions Precautions Precautions: Fall Precaution Comments: Enteric Required Braces or Orthoses: Other Brace Other Brace: pt reports he uses L ankle brace, check room next session Restrictions Weight Bearing Restrictions: No     Mobility  Bed Mobility Overal bed mobility: Modified Independent       Supine to sit: Modified independent (Device/Increase time), HOB elevated     General bed mobility comments: pt using bed features/rails    Transfers Overall transfer level: Needs assistance Equipment used: None Transfers: Sit to/from  Stand Sit to Stand: Supervision           General transfer comment: Pt pushing from bed; min increased postural sway upon standing.    Ambulation/Gait Ambulation/Gait assistance: Min assist Gait Distance (Feet): 300 Feet Assistive device: None Gait Pattern/deviations: Step-through pattern, Decreased stride length, Wide base of support, Staggering left       General Gait Details: Pt demonstrates increased postural sway and wide BOS without AD. Of note, pt circumducting LLE and did not mention brace (PTA did not see it mentioned in previous session note) but when pt asked about gait deviations, pt states he normally wears a L ankle brace. Reinforced for pt to don this prior to standing with staff for any OOB mobility to reduce fall risk. Pt needing up to minA to correct lateral LOB a few occasions, but no buckling without AD. Pt steadier with RW and reinforced that he needs to keep using cane or RW for OOB mobility when not in PT sessions to reduce fall risk, pt receptive. Plan to trial [facility] next session.   Stairs Stairs:  (pt reports no STE home, reviewed curb step sequencing.)           Wheelchair Mobility     Tilt Bed    Modified Rankin (Stroke Patients Only)       Balance Overall balance assessment: Needs assistance Sitting-balance support: No upper extremity supported, Feet supported Sitting balance-Leahy Scale: Good Sitting balance - Comments: EOB   Standing balance support: No upper extremity supported, During functional activity Standing balance-Leahy Scale: Poor Standing balance comment: Fair static standing, poor dynamic standing without AD needing  external assist to correct LOB on a few occasions.                            Cognition Arousal/Alertness: Awake/alert Behavior During Therapy: WFL for tasks assessed/performed Overall Cognitive Status: Within Functional Limits for tasks assessed                         Following Commands:  Follows one step commands consistently Safety/Judgement: Decreased awareness of safety   Problem Solving: Slow processing General Comments: Pleasantly cooperative, pt did not mention LE brace until prompted by PTA when already up and mobilizing, therefore added to precs section to ensure we go over brace donning/doffing and compliance for fall risk prevention.        Exercises      General Comments General comments (skin integrity, edema, etc.): BP 140/53 (77) SpO2 97% on RA; HR 69 bpm sitting EOB, BP 90's bpm with exertion.      Pertinent Vitals/Pain Pain Assessment Pain Assessment: No/denies pain Pain Intervention(s): Monitored during session     PT Goals (current goals can now be found in the care plan section) Acute Rehab PT Goals Patient Stated Goal: return home PT Goal Formulation: With patient Time For Goal Achievement: 11/11/22 Progress towards PT goals: Progressing toward goals    Frequency    Min 1X/week      PT Plan Current plan remains appropriate       AM-PAC PT "6 Clicks" Mobility   Outcome Measure  Help needed turning from your back to your side while in a flat bed without using bedrails?: None Help needed moving from lying on your back to sitting on the side of a flat bed without using bedrails?: None Help needed moving to and from a bed to a chair (including a wheelchair)?: A Little Help needed standing up from a chair using your arms (e.g., wheelchair or bedside chair)?: A Little Help needed to walk in hospital room?: A Little Help needed climbing 3-5 steps with a railing? : A Little 6 Click Score: 20    End of Session Equipment Utilized During Treatment: Gait belt Activity Tolerance: Patient tolerated treatment well Patient left: with call bell/phone within reach;in chair Nurse Communication: Mobility status PT Visit Diagnosis: Other abnormalities of gait and mobility (R26.89);Muscle weakness (generalized) (M62.81);Difficulty in walking, not  elsewhere classified (R26.2)     Time: 5284-1324 PT Time Calculation (min) (ACUTE ONLY): 28 min  Charges:    $Gait Training: 8-22 mins $Therapeutic Activity: 8-22 mins PT General Charges $$ ACUTE PT VISIT: 1 Visit                     Naiara Lombardozzi P., PTA Acute Rehabilitation Services Secure Chat Preferred 9a-5:30pm Office: [phone]    Dorathy Kinsman [facility] 11/03/2022, 2:34 PM

## 2022-11-03 NOTE — Progress Notes (Signed)
PROGRESS NOTE        PATIENT DETAILS Name: Troy Johnston Age: 72 y.o. Sex: male Date of Birth: 04/19/1950 Admit Date: 10/25/2022 Admitting Physician Oretha Milch, MD GUR:KYHCWC, Lenn Sink  Brief Summary: Patient is a 72 y.o.  male with history of CKD 4, HTN, DM-2 who presented with dyspnea/altered mental status-found to have multifactorial shock (septic and cardiogenic), AKI-Hospital course complicated by PEA arrest-with ROSC after 10 minutes.  Stabilized in the ICU-evaluated by advanced heart failure team-and nephrology-subsequently transferred to [facility] on 7/30.  See below for further details.  Significant events: 07/23>>: Admission to ICU 07/24>> AF-RVR >> amio gtt ,Placement of HD cath, PEA arrest ,intubated ,ROSC after 10 minutes , EF low at 20% 07/25>> Patient extubated, started CRRT , hypotensive and given amiodarone again 07/26>> weaned off Levophed 07/27>> CRRT stopped 07/30>> transferred to [facility] 07/31>> worsening creatinine-underwent HD.  Significant studies: 7/23>> CXR: Possible retrocardiac opacities 7/23>> renal ultrasound: Mild right-sided hydronephrosis 7/24>> echo: EF 20-25%, RV systolic function severely reduced. 7/29>> repeat limited echo: EF 40-45%, RV systolic function normal  Significant microbiology data: 7/23>> COVID/influenza/RSV PCR: Negative 7/23>> blood culture: Enterobacter cloacae 7/24>> stool C. difficile: Negative 7/24>> stool GI pathogen panel: Norovirus  Procedures: 7/24-7/25>> ETT  Consults: Cardiology Nephrology PCCM  Subjective: No major issues overnight-no complaints.  Objective: Vitals: Blood pressure 119/69, pulse 64, temperature 99.3 F (37.4 C), temperature source Oral, resp. rate 18, height 5\' 8"  (1.727 m), weight 102.2 kg, SpO2 93%.   Exam: Gen Exam:Alert awake-not in any distress HEENT:atraumatic, normocephalic Chest: B/L clear to auscultation anteriorly CVS:S1S2 regular Abdomen:soft non  tender, non distended Extremities:+ edema Neurology: Non focal Skin: no rash  Pertinent Labs/Radiology:    Latest Ref Rng & Units 11/03/2022    8:40 AM 11/02/2022   12:04 AM 11/01/2022    4:04 AM  CBC  WBC 4.0 - 10.5 K/uL 6.1  9.1  14.9   Hemoglobin 13.0 - 17.0 g/dL 8.1  8.7  8.7   Hematocrit 39.0 - 52.0 % 25.5  27.2  26.4   Platelets 150 - 400 K/uL 169  141  124     Lab Results  Component Value Date   NA 132 (L) 11/03/2022   K 3.7 11/03/2022   CL 97 (L) 11/03/2022   CO2 28 11/03/2022      Assessment/Plan: Multifactorial shock-septic and cardiogenic shock Enterobacter cloacae bacteremia Norovirus infection Shock physiology significantly better-BP stable Bacteremia treated with 7 days of empiric antibacterial therapy Continue to monitor off antibiotics  Acute biventricular systolic heart failure Volume status better-due to worsening creatinine-underwent HD on 7/31.  Repeat echo on 7/29 with improvement in LVEF and RV systolic function Cardiology had contemplated starting GDMT medications-but since patient has been started on intermittent hemodialysis-and at risk for blood pressure drops-cardiology holding off on starting GDMT meds.   PEA cardiac arrest V-fib s/p defibrillation Paroxysmal atrial fibrillation Cardiac arrest felt to be in the setting of septic shock Did not tolerate amiodarone-thankfully maintaining sinus rhythm Continue IV heparin-with plans to transition to Eliquis once we have ensured that no further procedures are required.  AKI on CKD 4 Hemodynamically mediated in the setting of hypotension/shock Volume status is stabilized-unfortunately-since renal function worsened-underwent HD on 7/31-nephrology continues to follow with plans to pursue intermittent HD and allow time for renal recovery.    Acute hypoxic respiratory failure Intubated  in the setting of cardiac arrest-extubated on 7/25-currently doing well on room air.  Shock liver Secondary to  septic shock-significant improvement in liver enzymes. Follow liver enzymes periodically  DM-2 (A1c 7.5 on 7/24) CBG stable on Semglee 10 units daily and SSI  Recent Labs    11/03/22 0336 11/03/22 0812 11/03/22 1312  GLUCAP 168* 115* 130*     HLD Statin on hold due to elevated LFTs  HTN BP stable-Coreg/hydralazine/losartan on hold for now.  Nutrition Status: Nutrition Problem: Increased nutrient needs Etiology: acute illness (AKI on CRRT) Signs/Symptoms: estimated needs Interventions: Ensure Enlive (each supplement provides 350kcal and 20 grams of protein), MVI, Liberalize Diet  Obesity: Estimated body mass index is 34.26 kg/m as calculated from the following:   Height as of this encounter: 5\' 8"  (1.727 m).   Weight as of this encounter: 102.2 kg.   Code status:   Code Status: DNR   DVT Prophylaxis: SCDs Start: 10/25/22 1759 IV Heparin   Family Communication: None at bedside   Disposition Plan: Status is: Inpatient Remains inpatient appropriate because: Severity of illness   Planned Discharge Destination:Home health   Diet: Diet Order             Diet regular Room service appropriate? Yes; Fluid consistency: Thin; Fluid restriction: 1200 mL Fluid  Diet effective now                     Antimicrobial agents: Anti-infectives (From admission, onward)    Start     Dose/Rate Route Frequency Ordered Stop   10/31/22 0600  meropenem (MERREM) 1 g in sodium chloride 0.9 % 100 mL IVPB        1 g 200 mL/hr over 30 Minutes Intravenous Every 24 hours 10/30/22 0835 11/01/22 0645   10/27/22 1400  meropenem (MERREM) 1 g in sodium chloride 0.9 % 100 mL IVPB  Status:  Discontinued        1 g 200 mL/hr over 30 Minutes Intravenous Every 8 hours 10/27/22 1122 10/30/22 0835   10/26/22 1000  meropenem (MERREM) 1 g in sodium chloride 0.9 % 100 mL IVPB  Status:  Discontinued        1 g 200 mL/hr over 30 Minutes Intravenous Daily 10/26/22 0825 10/27/22 1122   10/26/22  0500  aztreonam (AZACTAM) 2 g in sodium chloride 0.9 % 100 mL IVPB  Status:  Discontinued        2 g 200 mL/hr over 30 Minutes Intravenous Every 12 hours 10/26/22 0325 10/26/22 0825   10/26/22 0415  vancomycin (VANCOREADY) IVPB 1500 mg/300 mL  Status:  Discontinued        1,500 mg 150 mL/hr over 120 Minutes Intravenous Every 24 hours 10/26/22 0325 10/26/22 0359   10/26/22 0415  metroNIDAZOLE (FLAGYL) IVPB 500 mg  Status:  Discontinued        500 mg 100 mL/hr over 60 Minutes Intravenous Every 8 hours 10/26/22 0325 10/26/22 0829   10/26/22 0357  vancomycin variable dose per unstable renal function (pharmacist dosing)  Status:  Discontinued         Does not apply See admin instructions 10/26/22 0358 10/26/22 0829   10/25/22 1645  vancomycin (VANCOREADY) IVPB 2000 mg/400 mL        2,000 mg 200 mL/hr over 120 Minutes Intravenous  Once 10/25/22 1632 10/25/22 2132   10/25/22 1615  aztreonam (AZACTAM) 2 g in sodium chloride 0.9 % 100 mL IVPB        2  g 200 mL/hr over 30 Minutes Intravenous  Once 10/25/22 1607 10/25/22 1924   10/25/22 1615  metroNIDAZOLE (FLAGYL) IVPB 500 mg        500 mg 100 mL/hr over 60 Minutes Intravenous  Once 10/25/22 1607 10/25/22 1728   10/25/22 1615  vancomycin (VANCOCIN) IVPB 1000 mg/200 mL premix  Status:  Discontinued        1,000 mg 200 mL/hr over 60 Minutes Intravenous  Once 10/25/22 1607 10/25/22 1632        MEDICATIONS: Scheduled Meds:  Chlorhexidine Gluconate Cloth  6 each Topical Daily   Chlorhexidine Gluconate Cloth  6 each Topical Q0600   docusate sodium  100 mg Oral BID   feeding supplement  237 mL Oral TID BM   insulin aspart  0-15 Units Subcutaneous TID WC   insulin aspart  0-5 Units Subcutaneous QHS   insulin glargine-yfgn  10 Units Subcutaneous Daily   multivitamin  1 tablet Oral QHS   pantoprazole  40 mg Oral QHS   polyethylene glycol  17 g Oral Daily   Continuous Infusions:  sodium chloride Stopped (10/30/22 1501)   sodium chloride      heparin 1,600 Units/hr (11/03/22 0654)   PRN Meds:.Place/Maintain arterial line **AND** sodium chloride, acetaminophen, docusate sodium, mouth rinse, polyethylene glycol   I have personally reviewed following labs and imaging studies  LABORATORY DATA: CBC: Recent Labs  Lab 10/30/22 0500 10/31/22 0448 11/01/22 0404 11/02/22 0004 11/03/22 0840  WBC 19.0* 16.6* 14.9* 9.1 6.1  HGB 9.1* 8.3* 8.7* 8.7* 8.1*  HCT 28.1* 25.9* 26.4* 27.2* 25.5*  MCV 81.0 82.2 83.3 85.0 84.2  PLT 88* 106* 124* 141* 169    Basic Metabolic Panel: Recent Labs  Lab 10/28/22 0433 10/28/22 1602 10/29/22 0330 10/29/22 1648 10/30/22 0500 10/31/22 0447 10/31/22 0448 11/01/22 0404 11/02/22 0004 11/03/22 0840  NA 135   < > 132* 133* 136 135  --  135 135 132*  K 3.9   < > 3.6 3.2* 3.6 3.3*  --  3.8 4.1 3.7  CL 100   < > 99 101 103 101  --  100 99 97*  CO2 20*   < > 24 24 22 23   --  23 24 28   GLUCOSE 163*   < > 159* 173* 122* 192*  --  153* 168* 121*  BUN 80*   < > 54* 55* 68* 81*  --  87* 86* 47*  CREATININE 4.67*   < > 3.16* 3.24* 3.93* 3.97*  --  4.32* 4.63* 3.52*  CALCIUM 7.2*   < > 7.1* 7.1* 7.3* 7.5*  --  7.7* 7.8* 7.5*  MG 2.5*  --  2.4  --  2.4  --  2.4  --   --   --   PHOS 3.8   < > 2.0* 2.2* 3.5 4.2  --  4.6  --   --    < > = values in this interval not displayed.    GFR: Estimated Creatinine Clearance: 21.7 mL/min (A) (by C-G formula based on SCr of 3.52 mg/dL (H)).  Liver Function Tests: Recent Labs  Lab 10/28/22 0433 10/28/22 1602 10/29/22 0330 10/29/22 1648 10/30/22 0500 10/31/22 0447 11/01/22 0404 11/03/22 0840  AST 445*  --  327*  --   --   --   --  36  ALT 601*  --  536*  --   --   --   --  98*  ALKPHOS 104  --  129*  --   --   --   --  81  BILITOT 1.9*  --  1.9*  --   --   --   --  0.9  PROT 5.9*  --  5.5*  --   --   --   --  6.1*  ALBUMIN 2.0*  1.9*   < > 1.9* 2.0* 1.8* 2.1* 2.4* 2.2*   < > = values in this interval not displayed.   No results for input(s):  "LIPASE", "AMYLASE" in the last 168 hours. No results for input(s): "AMMONIA" in the last 168 hours.  Coagulation Profile: No results for input(s): "INR", "PROTIME" in the last 168 hours.  Cardiac Enzymes: No results for input(s): "CKTOTAL", "CKMB", "CKMBINDEX", "TROPONINI" in the last 168 hours.  BNP (last 3 results) No results for input(s): "PROBNP" in the last 8760 hours.  Lipid Profile: No results for input(s): "CHOL", "HDL", "LDLCALC", "TRIG", "CHOLHDL", "LDLDIRECT" in the last 72 hours.  Thyroid Function Tests: No results for input(s): "TSH", "T4TOTAL", "FREET4", "T3FREE", "THYROIDAB" in the last 72 hours.  Anemia Panel: No results for input(s): "VITAMINB12", "FOLATE", "FERRITIN", "TIBC", "IRON", "RETICCTPCT" in the last 72 hours.  Urine analysis:    Component Value Date/Time   COLORURINE YELLOW 10/14/2013 1506   APPEARANCEUR CLEAR 10/14/2013 1506   LABSPEC 1.017 10/14/2013 1506   PHURINE 5.0 10/14/2013 1506   GLUCOSEU NEGATIVE 10/14/2013 1506   HGBUR TRACE (A) 10/14/2013 1506   BILIRUBINUR NEGATIVE 10/14/2013 1506   KETONESUR NEGATIVE 10/14/2013 1506   PROTEINUR 30 (A) 10/14/2013 1506   UROBILINOGEN 0.2 10/14/2013 1506   NITRITE NEGATIVE 10/14/2013 1506   LEUKOCYTESUR NEGATIVE 10/14/2013 1506    Sepsis Labs: Lactic Acid, Venous    Component Value Date/Time   LATICACIDVEN 2.7 (HH) 10/27/2022 0812    MICROBIOLOGY: Recent Results (from the past 240 hour(s))  Resp panel by RT-PCR (RSV, Flu A&B, Covid) Anterior Nasal Swab     Status: None   Collection Time: 10/25/22  3:41 PM   Specimen: Anterior Nasal Swab  Result Value Ref Range Status   SARS Coronavirus 2 by RT PCR NEGATIVE NEGATIVE Final   Influenza A by PCR NEGATIVE NEGATIVE Final   Influenza B by PCR NEGATIVE NEGATIVE Final    Comment: (NOTE) The Xpert Xpress SARS-CoV-2/FLU/RSV plus assay is intended as an aid in the diagnosis of influenza from Nasopharyngeal swab specimens and should not be used as a  sole basis for treatment. Nasal washings and aspirates are unacceptable for Xpert Xpress SARS-CoV-2/FLU/RSV testing.  Fact Sheet for Patients: BloggerCourse.com  Fact Sheet for Healthcare Providers: SeriousBroker.it  This test is not yet approved or cleared by the [place] FDA and has been authorized for detection and/or diagnosis of SARS-CoV-2 by FDA under an Emergency Use Authorization (EUA). This EUA will remain in effect (meaning this test can be used) for the duration of the COVID-19 declaration under Section 564(b)(1) of the Act, 21 U.S.C. section 360bbb-3(b)(1), unless the authorization is terminated or revoked.     Resp Syncytial Virus by PCR NEGATIVE NEGATIVE Final    Comment: (NOTE) Fact Sheet for Patients: BloggerCourse.com  Fact Sheet for Healthcare Providers: SeriousBroker.it  This test is not yet approved or cleared by the [place] FDA and has been authorized for detection and/or diagnosis of SARS-CoV-2 by FDA under an Emergency Use Authorization (EUA). This EUA will remain in effect (meaning this test can be used) for the duration of the COVID-19 declaration under Section 564(b)(1) of the Act, 21 U.S.C. section 360bbb-3(b)(1), unless the authorization is terminated or revoked.  Performed  at [facility], [address] [place], [place] [address]   Blood Culture (routine x 2)     Status: Abnormal   Collection Time: 10/25/22  4:58 PM   Specimen: BLOOD RIGHT HAND  Result Value Ref Range Status   Specimen Description BLOOD RIGHT HAND  Final   Special Requests   Final    BOTTLES DRAWN AEROBIC ONLY Blood Culture results may not be optimal due to an inadequate volume of blood received in culture bottles   Culture  Setup Time   Final    GRAM NEGATIVE RODS AEROBIC BOTTLE ONLY CRITICAL RESULT CALLED TO, READ BACK BY AND VERIFIED WITH: Halford Chessman  [id number] FCP Performed at [facility], [address] [place], [place] [address]    Culture ENTEROBACTER CLOACAE (A)  Final   Report Status 10/28/2022 FINAL  Final   Organism ID, Bacteria ENTEROBACTER CLOACAE  Final      Susceptibility   Enterobacter cloacae - MIC*    CEFEPIME <=0.12 SENSITIVE Sensitive     CEFTAZIDIME <=1 SENSITIVE Sensitive     CIPROFLOXACIN <=0.25 SENSITIVE Sensitive     GENTAMICIN <=1 SENSITIVE Sensitive     IMIPENEM <=0.25 SENSITIVE Sensitive     TRIMETH/SULFA 80 RESISTANT Resistant     PIP/TAZO 64 INTERMEDIATE Intermediate     * ENTEROBACTER CLOACAE  Blood Culture ID Panel (Reflexed)     Status: Abnormal   Collection Time: 10/25/22  4:58 PM  Result Value Ref Range Status   Enterococcus faecalis NOT DETECTED NOT DETECTED Final   Enterococcus Faecium NOT DETECTED NOT DETECTED Final   Listeria monocytogenes NOT DETECTED NOT DETECTED Final   Staphylococcus species NOT DETECTED NOT DETECTED Final   Staphylococcus aureus (BCID) NOT DETECTED NOT DETECTED Final   Staphylococcus epidermidis NOT DETECTED NOT DETECTED Final   Staphylococcus lugdunensis NOT DETECTED NOT DETECTED Final   Streptococcus species NOT DETECTED NOT DETECTED Final   Streptococcus agalactiae NOT DETECTED NOT DETECTED Final   Streptococcus pneumoniae NOT DETECTED NOT DETECTED Final   Streptococcus pyogenes NOT DETECTED NOT DETECTED Final   A.calcoaceticus-baumannii NOT DETECTED NOT DETECTED Final   Bacteroides fragilis NOT DETECTED NOT DETECTED Final   Enterobacterales DETECTED (A) NOT DETECTED Final    Comment: Enterobacterales represent a large order of gram negative bacteria, not a single organism. CRITICAL RESULT CALLED TO, READ BACK BY AND VERIFIED WITH: PHARMD EMILY S. [phone] [id number] FCP    Enterobacter cloacae complex DETECTED (A) NOT DETECTED Final    Comment: CRITICAL RESULT CALLED TO, READ BACK BY AND VERIFIED WITH: PHARMD EMILY S. [phone] FCP    Escherichia coli NOT  DETECTED NOT DETECTED Final   Klebsiella aerogenes NOT DETECTED NOT DETECTED Final   Klebsiella oxytoca NOT DETECTED NOT DETECTED Final   Klebsiella pneumoniae NOT DETECTED NOT DETECTED Final   Proteus species NOT DETECTED NOT DETECTED Final   Salmonella species NOT DETECTED NOT DETECTED Final   Serratia marcescens NOT DETECTED NOT DETECTED Final   Haemophilus influenzae NOT DETECTED NOT DETECTED Final   Neisseria meningitidis NOT DETECTED NOT DETECTED Final   Pseudomonas aeruginosa NOT DETECTED NOT DETECTED Final   Stenotrophomonas maltophilia NOT DETECTED NOT DETECTED Final   Candida albicans NOT DETECTED NOT DETECTED Final   Candida auris NOT DETECTED NOT DETECTED Final   Candida glabrata NOT DETECTED NOT DETECTED Final   Candida krusei NOT DETECTED NOT DETECTED Final   Candida parapsilosis NOT DETECTED NOT DETECTED Final   Candida tropicalis NOT  DETECTED NOT DETECTED Final   Cryptococcus neoformans/gattii NOT DETECTED NOT DETECTED Final   CTX-M ESBL NOT DETECTED NOT DETECTED Final   Carbapenem resistance IMP NOT DETECTED NOT DETECTED Final   Carbapenem resistance KPC NOT DETECTED NOT DETECTED Final   Carbapenem resistance NDM NOT DETECTED NOT DETECTED Final   Carbapenem resist OXA 48 LIKE NOT DETECTED NOT DETECTED Final   Carbapenem resistance VIM NOT DETECTED NOT DETECTED Final    Comment: Performed at [facility], [address] [place], [place] [address]  Blood Culture (routine x 2)     Status: None   Collection Time: 10/25/22  8:55 PM   Specimen: BLOOD RIGHT HAND  Result Value Ref Range Status   Specimen Description BLOOD RIGHT HAND  Final   Special Requests   Final    BOTTLES DRAWN AEROBIC AND ANAEROBIC Blood Culture adequate volume   Culture   Final    NO GROWTH 5 DAYS Performed at [facility], [address], [place], [place] [address]    Report Status 10/30/2022 FINAL  Final  MRSA Next Gen by PCR, Nasal     Status: None   Collection Time: 10/25/22   9:13 PM   Specimen: Nasal Mucosa; Nasal Swab  Result Value Ref Range Status   MRSA by PCR Next Gen NOT DETECTED NOT DETECTED Final    Comment: (NOTE) The GeneXpert MRSA Assay (FDA approved for NASAL specimens only), is one component of a comprehensive MRSA colonization surveillance program. It is not intended to diagnose MRSA infection nor to guide or monitor treatment for MRSA infections. Test performance is not FDA approved in patients less than 40 years old. Performed at [facility], [address], [place], [place] [address]   Gastrointestinal Panel by PCR , Stool     Status: Abnormal   Collection Time: 10/26/22  8:24 AM   Specimen: Stool  Result Value Ref Range Status   Campylobacter species NOT DETECTED NOT DETECTED Final   Plesimonas shigelloides NOT DETECTED NOT DETECTED Final   Salmonella species NOT DETECTED NOT DETECTED Final   Yersinia enterocolitica NOT DETECTED NOT DETECTED Final   Vibrio species NOT DETECTED NOT DETECTED Final   Vibrio cholerae NOT DETECTED NOT DETECTED Final   Enteroaggregative E coli (EAEC) NOT DETECTED NOT DETECTED Final   Enteropathogenic E coli (EPEC) NOT DETECTED NOT DETECTED Final   Enterotoxigenic E coli (ETEC) NOT DETECTED NOT DETECTED Final   Shiga like toxin producing E coli (STEC) NOT DETECTED NOT DETECTED Final   Shigella/Enteroinvasive E coli (EIEC) NOT DETECTED NOT DETECTED Final   Cryptosporidium NOT DETECTED NOT DETECTED Final   Cyclospora cayetanensis NOT DETECTED NOT DETECTED Final   Entamoeba histolytica NOT DETECTED NOT DETECTED Final   Giardia lamblia NOT DETECTED NOT DETECTED Final   Adenovirus F40/41 NOT DETECTED NOT DETECTED Final   Astrovirus NOT DETECTED NOT DETECTED Final   Norovirus GI/GII DETECTED (A) NOT DETECTED Final    Comment: RESULT CALLED TO, READ BACK BY AND VERIFIED WITH: SAM OWNLEY 10/26/22 2053 MU    Rotavirus A NOT DETECTED NOT DETECTED Final   Sapovirus (I, II, IV, and V) NOT DETECTED NOT DETECTED  Final    Comment: Performed at [facility], [address] [place], [place] [address]  C Difficile Quick Screen w PCR reflex     Status: None   Collection Time: 10/26/22  8:24 AM   Specimen: STOOL  Result Value Ref Range Status   C Diff  antigen NEGATIVE NEGATIVE Final   C Diff toxin NEGATIVE NEGATIVE Final   C Diff interpretation No C. difficile detected.  Final    Comment: Performed at [facility], [address] [place], [place] [address]    RADIOLOGY STUDIES/RESULTS: No results found.   LOS: 9 days   Jeoffrey Massed, MD  [facility]    To contact the attending provider between 7A-7P or the covering provider during after hours 7P-7A, please log into the web site [email or web address] and access using universal [place] password for that web site. If you do not have the password, please call the hospital operator.  11/03/2022, 1:22 PM

## 2022-11-04 ENCOUNTER — Inpatient Hospital Stay (HOSPITAL_COMMUNITY): Payer: No Typology Code available for payment source

## 2022-11-04 DIAGNOSIS — N189 Chronic kidney disease, unspecified: Secondary | ICD-10-CM | POA: Diagnosis not present

## 2022-11-04 DIAGNOSIS — N179 Acute kidney failure, unspecified: Secondary | ICD-10-CM | POA: Diagnosis not present

## 2022-11-04 HISTORY — PX: IR FLUORO GUIDE CV LINE RIGHT: IMG2283

## 2022-11-04 HISTORY — PX: IR US GUIDE VASC ACCESS RIGHT: IMG2390

## 2022-11-04 LAB — GLUCOSE, CAPILLARY
Glucose-Capillary: 108 mg/dL — ABNORMAL HIGH (ref 70–99)
Glucose-Capillary: 83 mg/dL (ref 70–99)
Glucose-Capillary: 93 mg/dL (ref 70–99)
Glucose-Capillary: 95 mg/dL (ref 70–99)

## 2022-11-04 MED ORDER — VANCOMYCIN HCL IN DEXTROSE 1-5 GM/200ML-% IV SOLN
INTRAVENOUS | Status: AC | PRN
  Administered 2022-11-04: 1000 mg via INTRAVENOUS

## 2022-11-04 MED ORDER — FENTANYL CITRATE (PF) 100 MCG/2ML IJ SOLN
INTRAMUSCULAR | Status: AC
  Filled 2022-11-04: qty 2

## 2022-11-04 MED ORDER — LIDOCAINE-EPINEPHRINE 1 %-1:100000 IJ SOLN
20.0000 mL | Freq: Once | INTRAMUSCULAR | Status: AC
  Administered 2022-11-04: 10 mL via INTRADERMAL
  Filled 2022-11-04: qty 20

## 2022-11-04 MED ORDER — VANCOMYCIN HCL IN DEXTROSE 1-5 GM/200ML-% IV SOLN
INTRAVENOUS | Status: AC
  Filled 2022-11-04: qty 200

## 2022-11-04 MED ORDER — LIDOCAINE-EPINEPHRINE 1 %-1:100000 IJ SOLN
INTRAMUSCULAR | Status: AC
  Filled 2022-11-04: qty 1

## 2022-11-04 MED ORDER — LIDOCAINE HCL (PF) 1 % IJ SOLN: 5.0000 mL | INTRAMUSCULAR | Status: DC | PRN

## 2022-11-04 MED ORDER — ANTICOAGULANT SODIUM CITRATE 4% (200MG/5ML) IV SOLN: 5.0000 mL | Status: DC | PRN

## 2022-11-04 MED ORDER — HEPARIN SODIUM (PORCINE) 1000 UNIT/ML IJ SOLN
INTRAMUSCULAR | Status: AC
  Filled 2022-11-04: qty 10

## 2022-11-04 MED ORDER — MIDAZOLAM HCL 2 MG/2ML IJ SOLN
INTRAMUSCULAR | Status: AC
  Filled 2022-11-04: qty 2

## 2022-11-04 MED ORDER — PENTAFLUOROPROP-TETRAFLUOROETH EX AERO: 1.0000 | INHALATION_SPRAY | CUTANEOUS | Status: DC | PRN

## 2022-11-04 MED ORDER — SODIUM CHLORIDE 0.9% FLUSH
10.0000 mL | Freq: Two times a day (BID) | INTRAVENOUS | Status: DC
  Administered 2022-11-04 – 2022-11-08 (×8): 10 mL

## 2022-11-04 MED ORDER — ALTEPLASE 2 MG IJ SOLR: 2.0000 mg | Freq: Once | INTRAMUSCULAR | Status: DC | PRN

## 2022-11-04 MED ORDER — SODIUM CHLORIDE 0.9% FLUSH: 10.0000 mL | INTRAVENOUS | Status: DC | PRN

## 2022-11-04 MED ORDER — HEPARIN SODIUM (PORCINE) 1000 UNIT/ML DIALYSIS
1000.0000 [IU] | INTRAMUSCULAR | Status: DC | PRN
  Administered 2022-11-04: 3200 [IU]
  Filled 2022-11-04 (×3): qty 1

## 2022-11-04 MED ORDER — MIDAZOLAM HCL 2 MG/2ML IJ SOLN
INTRAMUSCULAR | Status: AC | PRN
  Administered 2022-11-04 (×2): .5 mg via INTRAVENOUS

## 2022-11-04 MED ORDER — FENTANYL CITRATE (PF) 100 MCG/2ML IJ SOLN
INTRAMUSCULAR | Status: AC | PRN
  Administered 2022-11-04: 50 ug via INTRAVENOUS

## 2022-11-04 MED ORDER — LIDOCAINE-PRILOCAINE 2.5-2.5 % EX CREA: 1.0000 | TOPICAL_CREAM | CUTANEOUS | Status: DC | PRN

## 2022-11-04 MED ORDER — CHLORHEXIDINE GLUCONATE 4 % EX SOLN
CUTANEOUS | Status: AC
  Filled 2022-11-04: qty 15

## 2022-11-04 NOTE — Progress Notes (Signed)
PT Cancellation Note  Patient Details Name: Troy Johnston MRN: [id number] DOB: 05-Sep-1950   Cancelled Treatment:    Reason Eval/Treat Not Completed: Patient at procedure or test/unavailable;Fatigue/lethargy limiting ability to participate;Patient declined, no reason specified (Attempt in AM pt was in HD on a MWF schedule. Attempt in PM pt states that he is extremely fatigued from HD and is unable to participate would like for physical therapy to attempt later. Will follow up as able and appropriate.)  Harrel Carina, DPT, CLT  Acute Rehabilitation Services Office: [phone] (Secure chat preferred)  Claudia Desanctis 11/04/2022, 4:20 PM

## 2022-11-04 NOTE — Progress Notes (Signed)
[facility] Progress Note   Assessment/ Plan:   Troy Johnston is a/an 72 y.o. male with a past medical history significant for HTN, DM2, CKD, admitted for septic shock and enterobacter bacteremia.         AKI on CKD IV:  likely hemodynamically mediated in the setting of shock.              - started CRRT on 7/25 and stopped on 7/27             -Temporary dialysis catheter in place, appreciate help from CCM             -IV Lasix trial 7/28, did well and Cr plateaued--however Cr has been inching up day by day  - IV Lasix 7/30  - HD 7/31, 8/2  - s/p [facility] 11/04/22  - will need CLIP- will d/w renal navigator   2.  Shock: septic with enterobacter bacteremia             - abx per primary             - TTE no obvious vegetation              -No longer requiring pressors    3.  Elevated troponins/ NSTEMI/PEA arrest/Afib w/ RVR             - cardiology following             - anticoagulation per primary   4.  Acute hypoxic RF:             - now extubated and much improved   5.  Elevated LFTs; improving             - most likely d/t tissue ischemia   6.  Thrombocytopenia: stable             - likely in setting of septic shock  Subjective:    HD yesterday, did well.  Better mentation today.     Objective:   BP (!) 138/55   Pulse (!) 59   Temp (!) 97.1 F (36.2 C) (Tympanic)   Resp 12   Ht 5\' 8"  (1.727 m)   Wt 102.2 kg   SpO2 100%   BMI 34.26 kg/m  No intake or output data in the 24 hours ending 11/04/22 1039  Weight change:   Physical Exam: Gen:NAD CVS: RRR Resp: muffled at bases Abd: soft Ext: 1+ LE edema ACCESS: R internal jugular nontunneled HD cath  Imaging: No results found.  Labs: BMET Recent Labs  Lab 10/28/22 1602 10/29/22 0330 10/29/22 1648 10/30/22 0500 10/31/22 0447 11/01/22 0404 11/02/22 0004 11/03/22 0840 11/04/22 0227  NA 136 132* 133* 136 135 135 135 132* 136  K 3.9 3.6 3.2* 3.6 3.3* 3.8 4.1 3.7 3.9  CL 100 99 101 103  101 100 99 97* 101  CO2 21* 24 24 22 23 23 24 28 24   GLUCOSE 213* 159* 173* 122* 192* 153* 168* 121* 108*  BUN 68* 54* 55* 68* 81* 87* 86* 47* 49*  CREATININE 3.71* 3.16* 3.24* 3.93* 3.97* 4.32* 4.63* 3.52* 3.82*  CALCIUM 7.4* 7.1* 7.1* 7.3* 7.5* 7.7* 7.8* 7.5* 8.0*  PHOS 2.9 2.0* 2.2* 3.5 4.2 4.6  --   --  5.0*   CBC Recent Labs  Lab 11/01/22 0404 11/02/22 0004 11/03/22 0840 11/04/22 0227  WBC 14.9* 9.1 6.1 5.9  HGB 8.7* 8.7* 8.1* 8.2*  HCT 26.4* 27.2* 25.5* 26.6*  MCV  83.3 85.0 84.2 85.0  PLT 124* 141* 169 166    Medications:     Chlorhexidine Gluconate Cloth  6 each Topical Daily   Chlorhexidine Gluconate Cloth  6 each Topical Q0600   docusate sodium  100 mg Oral BID   feeding supplement  237 mL Oral TID BM   insulin aspart  0-15 Units Subcutaneous TID WC   insulin aspart  0-5 Units Subcutaneous QHS   insulin glargine-yfgn  10 Units Subcutaneous Daily   multivitamin  1 tablet Oral QHS   pantoprazole  40 mg Oral QHS   polyethylene glycol  17 g Oral Daily   sodium chloride flush  10-40 mL Intracatheter Q12H   Bufford Buttner MD 11/04/2022, 10:39 AM

## 2022-11-04 NOTE — Progress Notes (Signed)
Received patient in bed to unit.  Alert and oriented.  Informed consent signed and in chart.   TX duration:  Patient tolerated well.  Transported back to the room  Alert, without acute distress.  Hand-off given to patient's nurse.   Access used: Right Permanent HD cath Access issues: none  Total UF removed: 1L Medication(s) given: none   11/04/22 1226  Vitals  Temp 97.8 F (36.6 C)  Temp Source Oral  BP (!) 168/57  MAP (mmHg) 87  BP Location Left Arm  BP Method Automatic  Patient Position (if appropriate) Lying  Pulse Rate (!) 58  Pulse Rate Source Monitor  ECG Heart Rate (!) 58  Resp 17  Oxygen Therapy  SpO2 99 %  O2 Device Room Air  Patient Activity (if Appropriate) In bed  Pulse Oximetry Type Continuous  During Treatment Monitoring  HD Safety Checks Performed Yes  Intra-Hemodialysis Comments Tx completed;Tolerated well     Stacie Glaze LPN [facility]

## 2022-11-04 NOTE — Procedures (Signed)
Patient seen and examined on Hemodialysis. The procedure was supervised and I have made appropriate changes. BP (!) 138/55   Pulse (!) 59   Temp (!) 97.1 F (36.2 C) (Tympanic)   Resp 12   Ht 5\' 8"  (1.727 m)   Wt 102.2 kg   SpO2 100%   BMI 34.26 kg/m    QB 400 mL/ min via TDC, UF goal 3L  Tolerating treatment without complaints at this time.   Bufford Buttner MD [facility] [phone] 10:41 AM

## 2022-11-04 NOTE — Progress Notes (Signed)
RN informed me that patient went to IR

## 2022-11-04 NOTE — Progress Notes (Addendum)
PROGRESS NOTE        PATIENT DETAILS Name: Troy Johnston Age: 72 y.o. Sex: male Date of Birth: 1950-07-23 Admit Date: 10/25/2022 Admitting Physician Oretha Milch, MD WUJ:[id number], Lenn Sink  Brief Summary: Patient is a 72 y.o.  male with history of CKD 4, HTN, DM-2 who presented with dyspnea/altered mental status-found to have multifactorial shock (septic and cardiogenic), AKI-Hospital course complicated by PEA arrest-with ROSC after 10 minutes.  Stabilized in the ICU-evaluated by advanced heart failure team-and nephrology-subsequently transferred to [facility] on 7/30.  See below for further details.  Significant events: 07/23>>: Admission to ICU 07/24>> AF-RVR >> amio gtt ,Placement of HD cath, PEA arrest ,intubated ,ROSC after 10 minutes , EF low at 20% 07/25>> Patient extubated, started CRRT , hypotensive and given amiodarone again 07/26>> weaned off Levophed 07/27>> CRRT stopped 07/30>> transferred to [facility] 07/31>> worsening creatinine-underwent HD.  Significant studies: 7/23>> CXR: Possible retrocardiac opacities 7/23>> renal ultrasound: Mild right-sided hydronephrosis 7/24>> echo: EF 20-25%, RV systolic function severely reduced. 7/29>> repeat limited echo: EF 40-45%, RV systolic function normal  Significant microbiology data: 7/23>> COVID/influenza/RSV PCR: Negative 7/23>> blood culture: Enterobacter cloacae 7/24>> stool C. difficile: Negative 7/24>> stool GI pathogen panel: Norovirus  Procedures: 7/24-7/25>> ETT  Consults: Cardiology Nephrology PCCM  Subjective: No complaints-lying comfortably in bed.  Objective: Vitals: Blood pressure (!) 153/62, pulse 61, temperature (!) 97.1 F (36.2 C), temperature source Tympanic, resp. rate 10, height 5\' 8"  (1.727 m), weight 102.2 kg, SpO2 100%.   Exam: Gen Exam:Alert awake-not in any distress HEENT:atraumatic, normocephalic Chest: B/L clear to auscultation anteriorly CVS:S1S2  regular Abdomen:soft non tender, non distended Extremities:trace edema Neurology: Non focal Skin: no rash  Pertinent Labs/Radiology:    Latest Ref Rng & Units 11/04/2022    2:27 AM 11/03/2022    8:40 AM 11/02/2022   12:04 AM  CBC  WBC 4.0 - 10.5 K/uL 5.9  6.1  9.1   Hemoglobin 13.0 - 17.0 g/dL 8.2  8.1  8.7   Hematocrit 39.0 - 52.0 % 26.6  25.5  27.2   Platelets 150 - 400 K/uL 166  169  141     Lab Results  Component Value Date   NA 136 11/04/2022   K 3.9 11/04/2022   CL 101 11/04/2022   CO2 24 11/04/2022      Assessment/Plan: Multifactorial shock-septic and cardiogenic shock Enterobacter cloacae bacteremia Norovirus infection Shock physiology significantly better-BP stable Completed course of antibiotics. Continue to monitor off antibiotics  Acute biventricular systolic heart failure Volume status improved with dialysis. Repeat echo on 7/29 with improvement in LVEF and RV systolic function Cardiology had contemplated starting GDMT medications-but since patient has been started on intermittent hemodialysis-and at risk for blood pressure drops-cardiology holding off on starting GDMT meds.   PEA cardiac arrest V-fib s/p defibrillation Paroxysmal atrial fibrillation Cardiac arrest felt to be in the setting of septic shock Did not tolerate amiodarone-thankfully maintaining sinus rhythm Continue IV heparin-with plans to transition to Eliquis once we have ensured that no further procedures are required.  AKI on CKD 4 Hemodynamically mediated in the setting of hypotension/shock Volume status is stabilized-unfortunately-since renal function worsened-started back on HD on 7/31-nephrology continues to follow-plan is to pursue intermittent HD and allow time for renal recovery.     Acute hypoxic respiratory failure Intubated in the setting of cardiac arrest-extubated on 7/25-currently  doing well on room air.  Shock liver Secondary to septic shock-significant improvement in liver  enzymes. Follow liver enzymes periodically  DM-2 (A1c 7.5 on 7/24) CBG stable on Semglee 10 units daily and SSI  Recent Labs    11/03/22 2012 11/04/22 0003 11/04/22 0329  GLUCAP 148* 83 93     HLD Statin on hold due to elevated LFTs  HTN BP stable-Coreg/hydralazine/losartan on hold for now.  Nutrition Status: Nutrition Problem: Increased nutrient needs Etiology: acute illness (AKI on CRRT) Signs/Symptoms: estimated needs Interventions: Ensure Enlive (each supplement provides 350kcal and 20 grams of protein), MVI, Liberalize Diet  Obesity: Estimated body mass index is 34.26 kg/m as calculated from the following:   Height as of this encounter: 5\' 8"  (1.727 m).   Weight as of this encounter: 102.2 kg.   Code status:   Code Status: DNR   DVT Prophylaxis: SCDs Start: 10/25/22 1759 IV Heparin   Family Communication: [phone] updated 8/2   Disposition Plan: Status is: Inpatient Remains inpatient appropriate because: Severity of illness   Planned Discharge Destination:Home health   Diet: Diet Order             Diet NPO time specified Except for: Sips with Meds  Diet effective midnight                     Antimicrobial agents: Anti-infectives (From admission, onward)    Start     Dose/Rate Route Frequency Ordered Stop   11/04/22 0819  vancomycin (VANCOCIN) IVPB 1000 mg/200 mL premix        over 60 Minutes Intravenous Continuous PRN 11/04/22 0819 11/04/22 0819   10/31/22 0600  meropenem (MERREM) 1 g in sodium chloride 0.9 % 100 mL IVPB        1 g 200 mL/hr over 30 Minutes Intravenous Every 24 hours 10/30/22 0835 11/01/22 0645   10/27/22 1400  meropenem (MERREM) 1 g in sodium chloride 0.9 % 100 mL IVPB  Status:  Discontinued        1 g 200 mL/hr over 30 Minutes Intravenous Every 8 hours 10/27/22 1122 10/30/22 0835   10/26/22 1000  meropenem (MERREM) 1 g in sodium chloride 0.9 % 100 mL IVPB  Status:  Discontinued        1 g 200 mL/hr over 30  Minutes Intravenous Daily 10/26/22 0825 10/27/22 1122   10/26/22 0500  aztreonam (AZACTAM) 2 g in sodium chloride 0.9 % 100 mL IVPB  Status:  Discontinued        2 g 200 mL/hr over 30 Minutes Intravenous Every 12 hours 10/26/22 0325 10/26/22 0825   10/26/22 0415  vancomycin (VANCOREADY) IVPB 1500 mg/300 mL  Status:  Discontinued        1,500 mg 150 mL/hr over 120 Minutes Intravenous Every 24 hours 10/26/22 0325 10/26/22 0359   10/26/22 0415  metroNIDAZOLE (FLAGYL) IVPB 500 mg  Status:  Discontinued        500 mg 100 mL/hr over 60 Minutes Intravenous Every 8 hours 10/26/22 0325 10/26/22 0829   10/26/22 0357  vancomycin variable dose per unstable renal function (pharmacist dosing)  Status:  Discontinued         Does not apply See admin instructions 10/26/22 0358 10/26/22 0829   10/25/22 1645  vancomycin (VANCOREADY) IVPB 2000 mg/400 mL        2,000 mg 200 mL/hr over 120 Minutes Intravenous  Once 10/25/22 1632 10/25/22 2132   10/25/22 1615  aztreonam (AZACTAM) 2 g  in sodium chloride 0.9 % 100 mL IVPB        2 g 200 mL/hr over 30 Minutes Intravenous  Once 10/25/22 1607 10/25/22 1924   10/25/22 1615  metroNIDAZOLE (FLAGYL) IVPB 500 mg        500 mg 100 mL/hr over 60 Minutes Intravenous  Once 10/25/22 1607 10/25/22 1728   10/25/22 1615  vancomycin (VANCOCIN) IVPB 1000 mg/200 mL premix  Status:  Discontinued        1,000 mg 200 mL/hr over 60 Minutes Intravenous  Once 10/25/22 1607 10/25/22 1632        MEDICATIONS: Scheduled Meds:  Chlorhexidine Gluconate Cloth  6 each Topical Daily   Chlorhexidine Gluconate Cloth  6 each Topical Q0600   docusate sodium  100 mg Oral BID   feeding supplement  237 mL Oral TID BM   insulin aspart  0-15 Units Subcutaneous TID WC   insulin aspart  0-5 Units Subcutaneous QHS   insulin glargine-yfgn  10 Units Subcutaneous Daily   multivitamin  1 tablet Oral QHS   pantoprazole  40 mg Oral QHS   polyethylene glycol  17 g Oral Daily   sodium chloride flush   10-40 mL Intracatheter Q12H   Continuous Infusions:  sodium chloride Stopped (10/30/22 1501)   sodium chloride     anticoagulant sodium citrate     heparin 1,600 Units/hr (11/04/22 0035)   PRN Meds:.Place/Maintain arterial line **AND** sodium chloride, acetaminophen, alteplase, anticoagulant sodium citrate, docusate sodium, heparin, lidocaine (PF), lidocaine-prilocaine, mouth rinse, pentafluoroprop-tetrafluoroeth, polyethylene glycol, sodium chloride flush   I have personally reviewed following labs and imaging studies  LABORATORY DATA: CBC: Recent Labs  Lab 10/31/22 0448 11/01/22 0404 11/02/22 0004 11/03/22 0840 11/04/22 0227  WBC 16.6* 14.9* 9.1 6.1 5.9  HGB 8.3* 8.7* 8.7* 8.1* 8.2*  HCT 25.9* 26.4* 27.2* 25.5* 26.6*  MCV 82.2 83.3 85.0 84.2 85.0  PLT 106* 124* 141* 169 166    Basic Metabolic Panel: Recent Labs  Lab 10/29/22 0330 10/29/22 1648 10/30/22 0500 10/31/22 0447 10/31/22 0448 11/01/22 0404 11/02/22 0004 11/03/22 0840 11/04/22 0227  NA 132* 133* 136 135  --  135 135 132* 136  K 3.6 3.2* 3.6 3.3*  --  3.8 4.1 3.7 3.9  CL 99 101 103 101  --  100 99 97* 101  CO2 24 24 22 23   --  23 24 28 24   GLUCOSE 159* 173* 122* 192*  --  153* 168* 121* 108*  BUN 54* 55* 68* 81*  --  87* 86* 47* 49*  CREATININE 3.16* 3.24* 3.93* 3.97*  --  4.32* 4.63* 3.52* 3.82*  CALCIUM 7.1* 7.1* 7.3* 7.5*  --  7.7* 7.8* 7.5* 8.0*  MG 2.4  --  2.4  --  2.4  --   --   --   --   PHOS 2.0* 2.2* 3.5 4.2  --  4.6  --   --  5.0*    GFR: Estimated Creatinine Clearance: 20 mL/min (A) (by C-G formula based on SCr of 3.82 mg/dL (H)).  Liver Function Tests: Recent Labs  Lab 10/29/22 0330 10/29/22 1648 10/30/22 0500 10/31/22 0447 11/01/22 0404 11/03/22 0840 11/04/22 0227  AST 327*  --   --   --   --  36  --   ALT 536*  --   --   --   --  98*  --   ALKPHOS 129*  --   --   --   --  81  --  BILITOT 1.9*  --   --   --   --  0.9  --   PROT 5.5*  --   --   --   --  6.1*  --   ALBUMIN  1.9*   < > 1.8* 2.1* 2.4* 2.2* 2.4*   < > = values in this interval not displayed.   No results for input(s): "LIPASE", "AMYLASE" in the last 168 hours. No results for input(s): "AMMONIA" in the last 168 hours.  Coagulation Profile: No results for input(s): "INR", "PROTIME" in the last 168 hours.  Cardiac Enzymes: No results for input(s): "CKTOTAL", "CKMB", "CKMBINDEX", "TROPONINI" in the last 168 hours.  BNP (last 3 results) No results for input(s): "PROBNP" in the last 8760 hours.  Lipid Profile: No results for input(s): "CHOL", "HDL", "LDLCALC", "TRIG", "CHOLHDL", "LDLDIRECT" in the last 72 hours.  Thyroid Function Tests: No results for input(s): "TSH", "T4TOTAL", "FREET4", "T3FREE", "THYROIDAB" in the last 72 hours.  Anemia Panel: No results for input(s): "VITAMINB12", "FOLATE", "FERRITIN", "TIBC", "IRON", "RETICCTPCT" in the last 72 hours.  Urine analysis:    Component Value Date/Time   COLORURINE YELLOW 10/14/2013 1506   APPEARANCEUR CLEAR 10/14/2013 1506   LABSPEC 1.017 10/14/2013 1506   PHURINE 5.0 10/14/2013 1506   GLUCOSEU NEGATIVE 10/14/2013 1506   HGBUR TRACE (A) 10/14/2013 1506   BILIRUBINUR NEGATIVE 10/14/2013 1506   KETONESUR NEGATIVE 10/14/2013 1506   PROTEINUR 30 (A) 10/14/2013 1506   UROBILINOGEN 0.2 10/14/2013 1506   NITRITE NEGATIVE 10/14/2013 1506   LEUKOCYTESUR NEGATIVE 10/14/2013 1506    Sepsis Labs: Lactic Acid, Venous    Component Value Date/Time   LATICACIDVEN 2.7 (HH) 10/27/2022 0812    MICROBIOLOGY: Recent Results (from the past 240 hour(s))  Resp panel by RT-PCR (RSV, Flu A&B, Covid) Anterior Nasal Swab     Status: None   Collection Time: 10/25/22  3:41 PM   Specimen: Anterior Nasal Swab  Result Value Ref Range Status   SARS Coronavirus 2 by RT PCR NEGATIVE NEGATIVE Final   Influenza A by PCR NEGATIVE NEGATIVE Final   Influenza B by PCR NEGATIVE NEGATIVE Final    Comment: (NOTE) The Xpert Xpress SARS-CoV-2/FLU/RSV plus assay is  intended as an aid in the diagnosis of influenza from Nasopharyngeal swab specimens and should not be used as a sole basis for treatment. Nasal washings and aspirates are unacceptable for Xpert Xpress SARS-CoV-2/FLU/RSV testing.  Fact Sheet for Patients: BloggerCourse.com  Fact Sheet for Healthcare Providers: SeriousBroker.it  This test is not yet approved or cleared by the [place] FDA and has been authorized for detection and/or diagnosis of SARS-CoV-2 by FDA under an Emergency Use Authorization (EUA). This EUA will remain in effect (meaning this test can be used) for the duration of the COVID-19 declaration under Section 564(b)(1) of the Act, 21 U.S.C. section 360bbb-3(b)(1), unless the authorization is terminated or revoked.     Resp Syncytial Virus by PCR NEGATIVE NEGATIVE Final    Comment: (NOTE) Fact Sheet for Patients: BloggerCourse.com  Fact Sheet for Healthcare Providers: SeriousBroker.it  This test is not yet approved or cleared by the [place] FDA and has been authorized for detection and/or diagnosis of SARS-CoV-2 by FDA under an Emergency Use Authorization (EUA). This EUA will remain in effect (meaning this test can be used) for the duration of the COVID-19 declaration under Section 564(b)(1) of the Act, 21 U.S.C. section 360bbb-3(b)(1), unless the authorization is terminated or revoked.  Performed at [facility], [address] [place], [place] [address]   Blood Culture (routine x 2)     Status: Abnormal   Collection Time: 10/25/22  4:58 PM   Specimen: BLOOD RIGHT HAND  Result Value Ref Range Status   Specimen Description BLOOD RIGHT HAND  Final   Special Requests   Final    BOTTLES DRAWN AEROBIC ONLY Blood Culture results may not be optimal due to an inadequate volume of blood received in culture bottles   Culture  Setup Time   Final     GRAM NEGATIVE RODS AEROBIC BOTTLE ONLY CRITICAL RESULT CALLED TO, READ BACK BY AND VERIFIED WITH: Halford Chessman [id number] FCP Performed at [facility], [address], [place], [place] [address]    Culture ENTEROBACTER CLOACAE (A)  Final   Report Status 10/28/2022 FINAL  Final   Organism ID, Bacteria ENTEROBACTER CLOACAE  Final      Susceptibility   Enterobacter cloacae - MIC*    CEFEPIME <=0.12 SENSITIVE Sensitive     CEFTAZIDIME <=1 SENSITIVE Sensitive     CIPROFLOXACIN <=0.25 SENSITIVE Sensitive     GENTAMICIN <=1 SENSITIVE Sensitive     IMIPENEM <=0.25 SENSITIVE Sensitive     TRIMETH/SULFA 80 RESISTANT Resistant     PIP/TAZO 64 INTERMEDIATE Intermediate     * ENTEROBACTER CLOACAE  Blood Culture ID Panel (Reflexed)     Status: Abnormal   Collection Time: 10/25/22  4:58 PM  Result Value Ref Range Status   Enterococcus faecalis NOT DETECTED NOT DETECTED Final   Enterococcus Faecium NOT DETECTED NOT DETECTED Final   Listeria monocytogenes NOT DETECTED NOT DETECTED Final   Staphylococcus species NOT DETECTED NOT DETECTED Final   Staphylococcus aureus (BCID) NOT DETECTED NOT DETECTED Final   Staphylococcus epidermidis NOT DETECTED NOT DETECTED Final   Staphylococcus lugdunensis NOT DETECTED NOT DETECTED Final   Streptococcus species NOT DETECTED NOT DETECTED Final   Streptococcus agalactiae NOT DETECTED NOT DETECTED Final   Streptococcus pneumoniae NOT DETECTED NOT DETECTED Final   Streptococcus pyogenes NOT DETECTED NOT DETECTED Final   A.calcoaceticus-baumannii NOT DETECTED NOT DETECTED Final   Bacteroides fragilis NOT DETECTED NOT DETECTED Final   Enterobacterales DETECTED (A) NOT DETECTED Final    Comment: Enterobacterales represent a large order of gram negative bacteria, not a single organism. CRITICAL RESULT CALLED TO, READ BACK BY AND VERIFIED WITH: PHARMD EMILY S. [phone] [id number] FCP    Enterobacter cloacae complex DETECTED (A) NOT DETECTED Final    Comment:  CRITICAL RESULT CALLED TO, READ BACK BY AND VERIFIED WITH: PHARMD EMILY S. [phone] [id number] FCP    Escherichia coli NOT DETECTED NOT DETECTED Final   Klebsiella aerogenes NOT DETECTED NOT DETECTED Final   Klebsiella oxytoca NOT DETECTED NOT DETECTED Final   Klebsiella pneumoniae NOT DETECTED NOT DETECTED Final   Proteus species NOT DETECTED NOT DETECTED Final   Salmonella species NOT DETECTED NOT DETECTED Final   Serratia marcescens NOT DETECTED NOT DETECTED Final   Haemophilus influenzae NOT DETECTED NOT DETECTED Final   Neisseria meningitidis NOT DETECTED NOT DETECTED Final   Pseudomonas aeruginosa NOT DETECTED NOT DETECTED Final   Stenotrophomonas maltophilia NOT DETECTED NOT DETECTED Final   Candida albicans NOT DETECTED NOT DETECTED Final   Candida auris NOT DETECTED NOT DETECTED Final   Candida glabrata NOT DETECTED NOT DETECTED Final   Candida krusei NOT DETECTED NOT DETECTED Final   Candida parapsilosis NOT DETECTED NOT DETECTED Final   Candida tropicalis NOT DETECTED NOT DETECTED Final   Cryptococcus  neoformans/gattii NOT DETECTED NOT DETECTED Final   CTX-M ESBL NOT DETECTED NOT DETECTED Final   Carbapenem resistance IMP NOT DETECTED NOT DETECTED Final   Carbapenem resistance KPC NOT DETECTED NOT DETECTED Final   Carbapenem resistance NDM NOT DETECTED NOT DETECTED Final   Carbapenem resist OXA 48 LIKE NOT DETECTED NOT DETECTED Final   Carbapenem resistance VIM NOT DETECTED NOT DETECTED Final    Comment: Performed at [facility], [address] [place], [place] [address]  Blood Culture (routine x 2)     Status: None   Collection Time: 10/25/22  8:55 PM   Specimen: BLOOD RIGHT HAND  Result Value Ref Range Status   Specimen Description BLOOD RIGHT HAND  Final   Special Requests   Final    BOTTLES DRAWN AEROBIC AND ANAEROBIC Blood Culture adequate volume   Culture   Final    NO GROWTH 5 DAYS Performed at [facility], [address], [place], [place] [address]     Report Status 10/30/2022 FINAL  Final  MRSA Next Gen by PCR, Nasal     Status: None   Collection Time: 10/25/22  9:13 PM   Specimen: Nasal Mucosa; Nasal Swab  Result Value Ref Range Status   MRSA by PCR Next Gen NOT DETECTED NOT DETECTED Final    Comment: (NOTE) The GeneXpert MRSA Assay (FDA approved for NASAL specimens only), is one component of a comprehensive MRSA colonization surveillance program. It is not intended to diagnose MRSA infection nor to guide or monitor treatment for MRSA infections. Test performance is not FDA approved in patients less than 15 years old. Performed at [facility], [address], [place], [place] [address]   Gastrointestinal Panel by PCR , Stool     Status: Abnormal   Collection Time: 10/26/22  8:24 AM   Specimen: Stool  Result Value Ref Range Status   Campylobacter species NOT DETECTED NOT DETECTED Final   Plesimonas shigelloides NOT DETECTED NOT DETECTED Final   Salmonella species NOT DETECTED NOT DETECTED Final   Yersinia enterocolitica NOT DETECTED NOT DETECTED Final   Vibrio species NOT DETECTED NOT DETECTED Final   Vibrio cholerae NOT DETECTED NOT DETECTED Final   Enteroaggregative E coli (EAEC) NOT DETECTED NOT DETECTED Final   Enteropathogenic E coli (EPEC) NOT DETECTED NOT DETECTED Final   Enterotoxigenic E coli (ETEC) NOT DETECTED NOT DETECTED Final   Shiga like toxin producing E coli (STEC) NOT DETECTED NOT DETECTED Final   Shigella/Enteroinvasive E coli (EIEC) NOT DETECTED NOT DETECTED Final   Cryptosporidium NOT DETECTED NOT DETECTED Final   Cyclospora cayetanensis NOT DETECTED NOT DETECTED Final   Entamoeba histolytica NOT DETECTED NOT DETECTED Final   Giardia lamblia NOT DETECTED NOT DETECTED Final   Adenovirus F40/41 NOT DETECTED NOT DETECTED Final   Astrovirus NOT DETECTED NOT DETECTED Final   Norovirus GI/GII DETECTED (A) NOT DETECTED Final    Comment: RESULT CALLED TO, READ BACK BY AND VERIFIED WITH: SAM OWNLEY 10/26/22  2053 MU    Rotavirus A NOT DETECTED NOT DETECTED Final   Sapovirus (I, II, IV, and V) NOT DETECTED NOT DETECTED Final    Comment: Performed at [facility], [address] [place], [place] [address]  C Difficile Quick Screen w PCR reflex     Status: None   Collection Time: 10/26/22  8:24 AM   Specimen: STOOL  Result Value Ref Range Status   C Diff antigen NEGATIVE NEGATIVE Final   C  Diff toxin NEGATIVE NEGATIVE Final   C Diff interpretation No C. difficile detected.  Final    Comment: Performed at [facility], [address] [place], [place] [address]    RADIOLOGY STUDIES/RESULTS: No results found.   LOS: 10 days   Jeoffrey Massed, MD  [facility]    To contact the attending provider between 7A-7P or the covering provider during after hours 7P-7A, please log into the web site [email or web address] and access using universal [place] password for that web site. If you do not have the password, please call the hospital operator.  11/04/2022, 12:19 PM

## 2022-11-04 NOTE — Progress Notes (Signed)
ANTICOAGULATION CONSULT NOTE - Follow Up  Pharmacy Consult for Heparin Indication: afib  Allergies  Allergen Reactions   Ferrous Sulfate Other (See Comments)   Penicillins Hives and Other (See Comments)    Has patient had a PCN reaction causing immediate rash, facial/tongue/throat swelling, SOB or lightheadedness with hypotension: no Has patient had a PCN reaction causing severe rash involving mucus membranes or skin necrosis: no Has patient had a PCN reaction that required hospitalization: yes Has patient had a PCN reaction occurring within the last 10 years: no If all of the above answers are "NO", then may proceed with Cephalosporin use.    Patient Measurements: Height: 5\' 8"  (172.7 cm) Weight: 102.2 kg (225 lb 5 oz) IBW/kg (Calculated) : 68.4 Heparin Dosing Weight: 88.3 kg  Vital Signs: Temp: 98.3 F (36.8 C) (08/02 0331) Temp Source: Oral (08/02 0331) BP: 137/45 (08/02 0331) Pulse Rate: 65 (08/02 0331)  Labs: Recent Labs    11/02/22 0004 11/02/22 0910 11/03/22 0840 11/03/22 0842 11/04/22 0227  HGB 8.7*  --  8.1*  --  8.2*  HCT 27.2*  --  25.5*  --  26.6*  PLT 141*  --  169  --  166  HEPARINUNFRC 0.46 0.57  --  0.49 0.64  CREATININE 4.63*  --  3.52*  --  3.82*    Estimated Creatinine Clearance: 20 mL/min (A) (by C-G formula based on SCr of 3.82 mg/dL (H)).   Medical History: Past Medical History:  Diagnosis Date   Diabetes mellitus without complication (HCC)    GERD (gastroesophageal reflux disease)    Hypertension    Prostate cancer (HCC)     Medications:  Medications Prior to Admission  Medication Sig Dispense Refill Last Dose   allopurinol (ZYLOPRIM) 100 MG tablet Take 50 mg by mouth every other day. For high uric acid level   unk   Alogliptin Benzoate 12.5 MG TABS Take 6.25 mg by mouth every morning. For diabetes (replaces metformin)   unk   atorvastatin (LIPITOR) 20 MG tablet Take 20 mg by mouth at bedtime. For cholesterol   unk   carvedilol  (COREG) 25 MG tablet Take 12.5 mg by mouth 2 (two) times daily with a meal.   un   glipiZIDE (GLUCOTROL) 10 MG tablet Take 5 mg by mouth 2 (two) times daily before a meal. For diabetes   unk   hydrALAZINE (APRESOLINE) 100 MG tablet Take 100 mg by mouth every 8 (eight) hours.   unk   losartan (COZAAR) 100 MG tablet Take 100 mg by mouth daily.   unk   Scheduled:   Chlorhexidine Gluconate Cloth  6 each Topical Daily   Chlorhexidine Gluconate Cloth  6 each Topical Q0600   docusate sodium  100 mg Oral BID   feeding supplement  237 mL Oral TID BM   insulin aspart  0-15 Units Subcutaneous TID WC   insulin aspart  0-5 Units Subcutaneous QHS   insulin glargine-yfgn  10 Units Subcutaneous Daily   multivitamin  1 tablet Oral QHS   pantoprazole  40 mg Oral QHS   polyethylene glycol  17 g Oral Daily   sodium chloride flush  10-40 mL Intracatheter Q12H   Infusions:   sodium chloride Stopped (10/30/22 1501)   sodium chloride     heparin 1,600 Units/hr (11/04/22 0035)   PRN: Place/Maintain arterial line **AND** sodium chloride, acetaminophen, docusate sodium, mouth rinse, polyethylene glycol, sodium chloride flush  Assessment: 71 yom with history of DM, prostate cancer remission, PUD  and GIB presenting with SOB. Heparin per pharmacy consult placed for chest pain/ACS - planned 48 hrs of treatment for NSTEMI, but now heparin continuing for new afib. Patient is not on anticoagulation PTA.  Heparin held 7/24 AM after cardiac arrest. Cleared by CCM to restart later in the day 7/24. 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 S/p CRRT 7/25-7/27. Monitoring for renal recovery.  Heparin level 0.64 therapeutic @ 1600 units/hr. Hg low but stable at 8.2. Plt stable. No bleeding or issues with infusion per chart.   Goal of Therapy:  Heparin level 0.3-0.7 units/ml Monitor platelets by anticoagulation protocol: Yes   Plan:  Continue  heparin at 1600 units/hr  Monitor daily heparin level/CBC, s/sx bleeding F/u transition to apixaban as appropriate  Verdene Rio, PharmD PGY1 Pharmacy Resident

## 2022-11-04 NOTE — Procedures (Signed)
  Procedure:  R internal jugular HD CVC placement Palindrome 19 Preprocedure diagnosis: The primary encounter diagnosis was Shortness of breath. Diagnoses of Shock (HCC), Acute kidney injury superimposed on chronic kidney disease (HCC), and Metabolic acidosis were also pertinent to this visit. Postprocedure diagnosis: same EBL:    minimal Complications:   none immediate  See full dictation in [employer].  Thora Lance MD Main # [phone] Pager  [phone] Mobile [phone]

## 2022-11-04 NOTE — Progress Notes (Signed)
Requested to see pt for out-pt HD needs at d/c. Met with pt at bedside. Introduced self and explained role. Discussed out-pt HD options. Pt prefers a clinic here in [place] but would like HD treatments to be billed to the [place]. Pt states he drives and if he cannot drive, pt states he can make arrangements for someone to drive him to HD treatments at d/c. Referral submitted to [facility] admissions this afternoon for review. Spoke to [facility] HD social worker to make her aware of pt's HD needs at d/c and that pt would like treatments billed to [place]. Clinicals faxed to [employer] today for [facility] to review for [facility] for payment for out-pt HD. Will assist as needed.   Olivia Canter Renal Navigator [phone]

## 2022-11-05 DIAGNOSIS — N189 Chronic kidney disease, unspecified: Secondary | ICD-10-CM | POA: Diagnosis not present

## 2022-11-05 DIAGNOSIS — N179 Acute kidney failure, unspecified: Secondary | ICD-10-CM | POA: Diagnosis not present

## 2022-11-05 LAB — GLUCOSE, CAPILLARY
Glucose-Capillary: 98 mg/dL (ref 70–99)
Glucose-Capillary: 88 mg/dL (ref 70–99)
Glucose-Capillary: 84 mg/dL (ref 70–99)
Glucose-Capillary: 80 mg/dL (ref 70–99)
Glucose-Capillary: 176 mg/dL — ABNORMAL HIGH (ref 70–99)
Glucose-Capillary: 103 mg/dL — ABNORMAL HIGH (ref 70–99)

## 2022-11-05 MED ORDER — INSULIN GLARGINE-YFGN 100 UNIT/ML ~~LOC~~ SOLN
6.0000 [IU] | Freq: Every day | SUBCUTANEOUS | Status: DC
  Administered 2022-11-06 – 2022-11-08 (×2): 6 [IU] via SUBCUTANEOUS
  Filled 2022-11-05 (×4): qty 0.06

## 2022-11-05 MED ORDER — APIXABAN 5 MG PO TABS
5.0000 mg | ORAL_TABLET | Freq: Two times a day (BID) | ORAL | Status: DC
  Administered 2022-11-05 – 2022-11-09 (×9): 5 mg via ORAL
  Filled 2022-11-05 (×9): qty 1

## 2022-11-05 MED ORDER — SODIUM CHLORIDE 0.9 % IV SOLN
250.0000 mg | Freq: Every day | INTRAVENOUS | Status: AC
  Administered 2022-11-05 – 2022-11-08 (×4): 250 mg via INTRAVENOUS
  Filled 2022-11-05 (×5): qty 20

## 2022-11-05 NOTE — Progress Notes (Signed)
[facility] Progress Note   Assessment/ Plan:   Troy Johnston is a/an 72 y.o. male with a past medical history significant for HTN, DM2, CKD, admitted for septic shock and enterobacter bacteremia.         AKI on CKD IV:  likely hemodynamically mediated in the setting of shock.              - started CRRT on 7/25 and stopped on 7/27             -Temporary dialysis catheter in place, appreciate help from CCM             -IV Lasix trial 7/28, did well and Cr plateaued--however Cr has been inching up day by day  - IV Lasix 7/30  - HD 7/31, 8/2  - s/p [facility] 11/04/22  - will need CLIP- will d/w renal navigator  - next HD Monday   2.  Shock: septic with enterobacter bacteremia             - abx per primary             - TTE no obvious vegetation              -No longer requiring pressors    3.  Elevated troponins/ NSTEMI/PEA arrest/Afib w/ RVR             - cardiology following             - anticoagulation per primary--> now getting switched from hep gtt to Eliquis   4.  Acute hypoxic RF:             - now extubated and much improved   5.  Elevated LFTs; improving             - most likely d/t tissue ischemia   6.  Thrombocytopenia: stable             - likely in setting of septic shock  Subjective:    Seen in room.  Feeling good, had HD yesterday.     Objective:   BP (!) 161/61 (BP Location: Left Arm)   Pulse 65   Temp 98.6 F (37 C) (Oral)   Resp 13   Ht 5\' 8"  (1.727 m)   Wt 102.2 kg   SpO2 98%   BMI 34.26 kg/m   Intake/Output Summary (Last 24 hours) at 11/05/2022 1312 Last data filed at 11/05/2022 1610 Gross per 24 hour  Intake --  Output 750 ml  Net -750 ml    Weight change:   Physical Exam: Gen:NAD CVS: RRR Resp: muffled at bases Abd: soft Ext: no LE edema ACCESS: R internal jugular nontunneled HD cath  Imaging: IR Fluoro Guide CV Line Right  Result Date: 11/04/2022 CLINICAL DATA:  Renal failure, needs durable venous access for  hemodialysis EXAM: TUNNELED HEMODIALYSIS CATHETER PLACEMENT WITH ULTRASOUND AND FLUOROSCOPIC GUIDANCE TECHNIQUE: The procedure, risks, benefits, and alternatives were explained to the patient. Questions regarding the procedure were encouraged and answered. The patient understands and consents to the procedure. As antibiotic prophylaxis, vancomycin 1 g was ordered pre-procedure and administered intravenously within one hour of incision.Patency of the right IJ vein was confirmed with ultrasound with image documentation. An appropriate skin site was determined. Region was prepped using maximum barrier technique including cap and mask, sterile gown, sterile gloves, large sterile sheet, and Chlorhexidine as cutaneous antisepsis. The region was infiltrated locally with 1% lidocaine. Intravenous Fentanyl and Versed  1mg  were administered as conscious sedation during continuous monitoring of the patient's level of consciousness and physiological / cardiorespiratory status by the radiology RN, with a total moderate sedation time of 11 minutes. Under real-time ultrasound guidance, the right IJ vein was accessed with a 21 gauge micropuncture needle; the needle tip within the vein was confirmed with ultrasound image documentation. Needle exchanged over the 018 guidewire for transitional dilator, which allowed advancement of a Benson wire into the IVC. Over this, an MPA catheter was advanced. A Palindrome 19 hemodialysis catheter was tunneled from the right anterior chest wall approach to the right IJ dermatotomy site. The MPA catheter was exchanged over an Amplatz wire for serial vascular dilators which allow placement of a peel-away sheath, through which the catheter was advanced under intermittent fluoroscopy, positioned with its tips in the proximal and midright atrium. Spot chest radiograph confirms good catheter position. No pneumothorax. Catheter was flushed and primed per protocol. Catheter secured externally with O  Prolene sutures. The right IJ dermatotomy site was closed with Dermabond. COMPLICATIONS: COMPLICATIONS None immediate FLUOROSCOPY: Radiation Exposure Index (as provided by the fluoroscopic device): 5 mGy air Kerma COMPARISON:  None Available. IMPRESSION: 1. Technically successful placement of tunneled right IJ hemodialysis catheter with ultrasound and fluoroscopic guidance. Ready for routine use. ACCESS: Remains approachable for percutaneous intervention as needed. Electronically Signed   By: Corlis Leak M.D.   On: 11/04/2022 12:19   IR US Guide Vasc Access Right  Result Date: 11/04/2022 CLINICAL DATA:  Renal failure, needs durable venous access for hemodialysis EXAM: TUNNELED HEMODIALYSIS CATHETER PLACEMENT WITH ULTRASOUND AND FLUOROSCOPIC GUIDANCE TECHNIQUE: The procedure, risks, benefits, and alternatives were explained to the patient. Questions regarding the procedure were encouraged and answered. The patient understands and consents to the procedure. As antibiotic prophylaxis, vancomycin 1 g was ordered pre-procedure and administered intravenously within one hour of incision.Patency of the right IJ vein was confirmed with ultrasound with image documentation. An appropriate skin site was determined. Region was prepped using maximum barrier technique including cap and mask, sterile gown, sterile gloves, large sterile sheet, and Chlorhexidine as cutaneous antisepsis. The region was infiltrated locally with 1% lidocaine. Intravenous Fentanyl and Versed 1mg  were administered as conscious sedation during continuous monitoring of the patient's level of consciousness and physiological / cardiorespiratory status by the radiology RN, with a total moderate sedation time of 11 minutes. Under real-time ultrasound guidance, the right IJ vein was accessed with a 21 gauge micropuncture needle; the needle tip within the vein was confirmed with ultrasound image documentation. Needle exchanged over the 018 guidewire for  transitional dilator, which allowed advancement of a Benson wire into the IVC. Over this, an MPA catheter was advanced. A Palindrome 19 hemodialysis catheter was tunneled from the right anterior chest wall approach to the right IJ dermatotomy site. The MPA catheter was exchanged over an Amplatz wire for serial vascular dilators which allow placement of a peel-away sheath, through which the catheter was advanced under intermittent fluoroscopy, positioned with its tips in the proximal and midright atrium. Spot chest radiograph confirms good catheter position. No pneumothorax. Catheter was flushed and primed per protocol. Catheter secured externally with O Prolene sutures. The right IJ dermatotomy site was closed with Dermabond. COMPLICATIONS: COMPLICATIONS None immediate FLUOROSCOPY: Radiation Exposure Index (as provided by the fluoroscopic device): 5 mGy air Kerma COMPARISON:  None Available. IMPRESSION: 1. Technically successful placement of tunneled right IJ hemodialysis catheter with ultrasound and fluoroscopic guidance. Ready for routine use. ACCESS: Remains approachable for  percutaneous intervention as needed. Electronically Signed   By: Corlis Leak M.D.   On: 11/04/2022 12:19    Labs: BMET Recent Labs  Lab 10/29/22 1648 10/30/22 0500 10/31/22 0447 11/01/22 0404 11/02/22 0004 11/03/22 0840 11/04/22 0227 11/05/22 0330  NA 133* 136 135 135 135 132* 136 134*  K 3.2* 3.6 3.3* 3.8 4.1 3.7 3.9 4.2  CL 101 103 101 100 99 97* 101 98  CO2 24 22 23 23 24 28 24 26   GLUCOSE 173* 122* 192* 153* 168* 121* 108* 93  BUN 55* 68* 81* 87* 86* 47* 49* 30*  CREATININE 3.24* 3.93* 3.97* 4.32* 4.63* 3.52* 3.82* 3.23*  CALCIUM 7.1* 7.3* 7.5* 7.7* 7.8* 7.5* 8.0* 8.3*  PHOS 2.2* 3.5 4.2 4.6  --   --  5.0* 4.3   CBC Recent Labs  Lab 11/02/22 0004 11/03/22 0840 11/04/22 0227 11/05/22 0330  WBC 9.1 6.1 5.9 5.3  HGB 8.7* 8.1* 8.2* 8.9*  HCT 27.2* 25.5* 26.6* 28.8*  MCV 85.0 84.2 85.0 87.5  PLT 141* 169 166  233    Medications:     apixaban  5 mg Oral BID   Chlorhexidine Gluconate Cloth  6 each Topical Daily   Chlorhexidine Gluconate Cloth  6 each Topical Q0600   docusate sodium  100 mg Oral BID   feeding supplement  237 mL Oral TID BM   insulin aspart  0-15 Units Subcutaneous TID WC   insulin aspart  0-5 Units Subcutaneous QHS   [START ON 11/06/2022] insulin glargine-yfgn  6 Units Subcutaneous Daily   multivitamin  1 tablet Oral QHS   pantoprazole  40 mg Oral QHS   polyethylene glycol  17 g Oral Daily   sodium chloride flush  10-40 mL Intracatheter Q12H   Bufford Buttner MD 11/05/2022, 1:12 PM

## 2022-11-05 NOTE — Plan of Care (Signed)

## 2022-11-05 NOTE — Progress Notes (Signed)
PROGRESS NOTE        PATIENT DETAILS Name: Troy Johnston Age: 72 y.o. Sex: male Date of Birth: 02-Aug-1950 Admit Date: 10/25/2022 Admitting Physician Oretha Milch, MD JXB:JYNWGN, Lenn Sink  Brief Summary: Patient is a 72 y.o.  male with history of CKD 4, HTN, DM-2 who presented with dyspnea/altered mental status-found to have multifactorial shock (septic and cardiogenic), AKI-Hospital course complicated by PEA arrest-with ROSC after 10 minutes.  Stabilized in the ICU-evaluated by advanced heart failure team-and nephrology-subsequently transferred to [facility] on 7/30.  See below for further details.  Significant events: 07/23>>: Admission to ICU 07/24>> AF-RVR >> amio gtt ,Placement of HD cath, PEA arrest ,intubated ,ROSC after 10 minutes , EF low at 20% 07/25>> Patient extubated, started CRRT , hypotensive and given amiodarone again 07/26>> weaned off Levophed 07/27>> CRRT stopped 07/30>> transferred to [facility] 07/31>> worsening creatinine-underwent HD.  Significant studies: 7/23>> CXR: Possible retrocardiac opacities 7/23>> renal ultrasound: Mild right-sided hydronephrosis 7/24>> echo: EF 20-25%, RV systolic function severely reduced. 7/29>> repeat limited echo: EF 40-45%, RV systolic function normal  Significant microbiology data: 7/23>> COVID/influenza/RSV PCR: Negative 7/23>> blood culture: Enterobacter cloacae 7/24>> stool C. difficile: Negative 7/24>> stool GI pathogen panel: Norovirus  Procedures: 7/24-7/25>> ETT 8/02>> tunneled dialysis catheter by IR  Consults: Cardiology Nephrology PCCM  Subjective: Lying comfortably in bed-no complaints.  Objective: Vitals: Blood pressure (!) 161/61, pulse 65, temperature 98.6 F (37 C), temperature source Oral, resp. rate 13, height 5\' 8"  (1.727 m), weight 102.2 kg, SpO2 98%.   Exam: Gen Exam:Alert awake-not in any distress HEENT:atraumatic, normocephalic Chest: B/L clear to auscultation  anteriorly CVS:S1S2 regular Abdomen:soft non tender, non distended Extremities:trace edema Neurology: Non focal Skin: no rash  Pertinent Labs/Radiology:    Latest Ref Rng & Units 11/05/2022    3:30 AM 11/04/2022    2:27 AM 11/03/2022    8:40 AM  CBC  WBC 4.0 - 10.5 K/uL 5.3  5.9  6.1   Hemoglobin 13.0 - 17.0 g/dL 8.9  8.2  8.1   Hematocrit 39.0 - 52.0 % 28.8  26.6  25.5   Platelets 150 - 400 K/uL 233  166  169     Lab Results  Component Value Date   NA 134 (L) 11/05/2022   K 4.2 11/05/2022   CL 98 11/05/2022   CO2 26 11/05/2022      Assessment/Plan: Multifactorial shock-septic and cardiogenic shock Enterobacter cloacae bacteremia Norovirus infection Shock physiology significantly better-BP stable Completed course of antibiotics. Continue to monitor off antibiotics  Acute biventricular systolic heart failure Volume status improved with dialysis. Repeat echo on 7/29 with improvement in LVEF and RV systolic function Cardiology had contemplated starting GDMT medications-but since patient has been started on intermittent hemodialysis-and at risk for blood pressure drops-cardiology holding off on starting GDMT meds.   PEA cardiac arrest V-fib s/p defibrillation Paroxysmal atrial fibrillation Cardiac arrest felt to be in the setting of septic shock Did not tolerate amiodarone-thankfully maintaining sinus rhythm Was on IV heparin-since no more procedures planned-stop IV heparin-transition to Eliquis.  AKI on CKD 4 Hemodynamically mediated in the setting of hypotension/shock Volume status stabilized-however renal function worsened-patient has been started on intermittent HD. TDC placed by IR on 8/2 Renal navigator following for outpatient HD placement. Continue to monitor for renal recovery.      Acute hypoxic respiratory failure Intubated in  the setting of cardiac arrest-extubated on 7/25-currently doing well on room air.  Shock liver Secondary to septic shock-significant  improvement in liver enzymes. Follow liver enzymes periodically  DM-2 (A1c 7.5 on 7/24) CBG stable but on the lower side-decrease Semglee to 6 units daily-remains on SSI.    Recent Labs    11/05/22 0340 11/05/22 0854 11/05/22 1225  GLUCAP 88 84 80     HLD Statin on hold due to elevated LFTs  HTN BP stable but slowly creeping up-reassess on 8/4-Coreg/hydralazine/losartan on hold for now.  Nutrition Status: Nutrition Problem: Increased nutrient needs Etiology: acute illness (AKI on CRRT) Signs/Symptoms: estimated needs Interventions: Ensure Enlive (each supplement provides 350kcal and 20 grams of protein), MVI, Liberalize Diet  Obesity: Estimated body mass index is 34.26 kg/m as calculated from the following:   Height as of this encounter: 5\' 8"  (1.727 m).   Weight as of this encounter: 102.2 kg.   Code status:   Code Status: DNR   DVT Prophylaxis: SCDs Start: 10/25/22 1759 IV Heparin   Family Communication: Nephew-[phone] updated 8/2   Disposition Plan: Status is: Inpatient Remains inpatient appropriate because: Severity of illness   Planned Discharge Destination:Home health   Diet: Diet Order             Diet regular Room service appropriate? Yes; Fluid consistency: Thin; Fluid restriction: 1200 mL Fluid  Diet effective now                     Antimicrobial agents: Anti-infectives (From admission, onward)    Start     Dose/Rate Route Frequency Ordered Stop   11/04/22 0819  vancomycin (VANCOCIN) IVPB 1000 mg/200 mL premix        over 60 Minutes Intravenous Continuous PRN 11/04/22 0819 11/04/22 0819   10/31/22 0600  meropenem (MERREM) 1 g in sodium chloride 0.9 % 100 mL IVPB        1 g 200 mL/hr over 30 Minutes Intravenous Every 24 hours 10/30/22 0835 11/01/22 0645   10/27/22 1400  meropenem (MERREM) 1 g in sodium chloride 0.9 % 100 mL IVPB  Status:  Discontinued        1 g 200 mL/hr over 30 Minutes Intravenous Every 8 hours 10/27/22 1122  10/30/22 0835   10/26/22 1000  meropenem (MERREM) 1 g in sodium chloride 0.9 % 100 mL IVPB  Status:  Discontinued        1 g 200 mL/hr over 30 Minutes Intravenous Daily 10/26/22 0825 10/27/22 1122   10/26/22 0500  aztreonam (AZACTAM) 2 g in sodium chloride 0.9 % 100 mL IVPB  Status:  Discontinued        2 g 200 mL/hr over 30 Minutes Intravenous Every 12 hours 10/26/22 0325 10/26/22 0825   10/26/22 0415  vancomycin (VANCOREADY) IVPB 1500 mg/300 mL  Status:  Discontinued        1,500 mg 150 mL/hr over 120 Minutes Intravenous Every 24 hours 10/26/22 0325 10/26/22 0359   10/26/22 0415  metroNIDAZOLE (FLAGYL) IVPB 500 mg  Status:  Discontinued        500 mg 100 mL/hr over 60 Minutes Intravenous Every 8 hours 10/26/22 0325 10/26/22 0829   10/26/22 0357  vancomycin variable dose per unstable renal function (pharmacist dosing)  Status:  Discontinued         Does not apply See admin instructions 10/26/22 0358 10/26/22 0829   10/25/22 1645  vancomycin (VANCOREADY) IVPB 2000 mg/400 mL  2,000 mg 200 mL/hr over 120 Minutes Intravenous  Once 10/25/22 1632 10/25/22 2132   10/25/22 1615  aztreonam (AZACTAM) 2 g in sodium chloride 0.9 % 100 mL IVPB        2 g 200 mL/hr over 30 Minutes Intravenous  Once 10/25/22 1607 10/25/22 1924   10/25/22 1615  metroNIDAZOLE (FLAGYL) IVPB 500 mg        500 mg 100 mL/hr over 60 Minutes Intravenous  Once 10/25/22 1607 10/25/22 1728   10/25/22 1615  vancomycin (VANCOCIN) IVPB 1000 mg/200 mL premix  Status:  Discontinued        1,000 mg 200 mL/hr over 60 Minutes Intravenous  Once 10/25/22 1607 10/25/22 1632        MEDICATIONS: Scheduled Meds:  Chlorhexidine Gluconate Cloth  6 each Topical Daily   Chlorhexidine Gluconate Cloth  6 each Topical Q0600   docusate sodium  100 mg Oral BID   feeding supplement  237 mL Oral TID BM   insulin aspart  0-15 Units Subcutaneous TID WC   insulin aspart  0-5 Units Subcutaneous QHS   insulin glargine-yfgn  10 Units  Subcutaneous Daily   multivitamin  1 tablet Oral QHS   pantoprazole  40 mg Oral QHS   polyethylene glycol  17 g Oral Daily   sodium chloride flush  10-40 mL Intracatheter Q12H   Continuous Infusions:  sodium chloride Stopped (10/30/22 1501)   sodium chloride     heparin 1,600 Units/hr (11/05/22 1135)   PRN Meds:.Place/Maintain arterial line **AND** sodium chloride, acetaminophen, docusate sodium, mouth rinse, polyethylene glycol, sodium chloride flush   I have personally reviewed following labs and imaging studies  LABORATORY DATA: CBC: Recent Labs  Lab 11/01/22 0404 11/02/22 0004 11/03/22 0840 11/04/22 0227 11/05/22 0330  WBC 14.9* 9.1 6.1 5.9 5.3  HGB 8.7* 8.7* 8.1* 8.2* 8.9*  HCT 26.4* 27.2* 25.5* 26.6* 28.8*  MCV 83.3 85.0 84.2 85.0 87.5  PLT 124* 141* 169 166 233    Basic Metabolic Panel: Recent Labs  Lab 10/30/22 0500 10/31/22 0447 10/31/22 0448 11/01/22 0404 11/02/22 0004 11/03/22 0840 11/04/22 0227 11/05/22 0330  NA 136 135  --  135 135 132* 136 134*  K 3.6 3.3*  --  3.8 4.1 3.7 3.9 4.2  CL 103 101  --  100 99 97* 101 98  CO2 22 23  --  23 24 28 24 26   GLUCOSE 122* 192*  --  153* 168* 121* 108* 93  BUN 68* 81*  --  87* 86* 47* 49* 30*  CREATININE 3.93* 3.97*  --  4.32* 4.63* 3.52* 3.82* 3.23*  CALCIUM 7.3* 7.5*  --  7.7* 7.8* 7.5* 8.0* 8.3*  MG 2.4  --  2.4  --   --   --   --   --   PHOS 3.5 4.2  --  4.6  --   --  5.0* 4.3    GFR: Estimated Creatinine Clearance: 23.6 mL/min (A) (by C-G formula based on SCr of 3.23 mg/dL (H)).  Liver Function Tests: Recent Labs  Lab 10/31/22 0447 11/01/22 0404 11/03/22 0840 11/04/22 0227 11/05/22 0330  AST  --   --  36  --   --   ALT  --   --  98*  --   --   ALKPHOS  --   --  81  --   --   BILITOT  --   --  0.9  --   --   PROT  --   --  6.1*  --   --   ALBUMIN 2.1* 2.4* 2.2* 2.4* 2.6*   No results for input(s): "LIPASE", "AMYLASE" in the last 168 hours. No results for input(s): "AMMONIA" in the last 168  hours.  Coagulation Profile: No results for input(s): "INR", "PROTIME" in the last 168 hours.  Cardiac Enzymes: No results for input(s): "CKTOTAL", "CKMB", "CKMBINDEX", "TROPONINI" in the last 168 hours.  BNP (last 3 results) No results for input(s): "PROBNP" in the last 8760 hours.  Lipid Profile: No results for input(s): "CHOL", "HDL", "LDLCALC", "TRIG", "CHOLHDL", "LDLDIRECT" in the last 72 hours.  Thyroid Function Tests: No results for input(s): "TSH", "T4TOTAL", "FREET4", "T3FREE", "THYROIDAB" in the last 72 hours.  Anemia Panel: No results for input(s): "VITAMINB12", "FOLATE", "FERRITIN", "TIBC", "IRON", "RETICCTPCT" in the last 72 hours.  Urine analysis:    Component Value Date/Time   COLORURINE YELLOW 10/14/2013 1506   APPEARANCEUR CLEAR 10/14/2013 1506   LABSPEC 1.017 10/14/2013 1506   PHURINE 5.0 10/14/2013 1506   GLUCOSEU NEGATIVE 10/14/2013 1506   HGBUR TRACE (A) 10/14/2013 1506   BILIRUBINUR NEGATIVE 10/14/2013 1506   KETONESUR NEGATIVE 10/14/2013 1506   PROTEINUR 30 (A) 10/14/2013 1506   UROBILINOGEN 0.2 10/14/2013 1506   NITRITE NEGATIVE 10/14/2013 1506   LEUKOCYTESUR NEGATIVE 10/14/2013 1506    Sepsis Labs: Lactic Acid, Venous    Component Value Date/Time   LATICACIDVEN 2.7 (HH) 10/27/2022 0812    MICROBIOLOGY: No results found for this or any previous visit (from the past 240 hour(s)).   RADIOLOGY STUDIES/RESULTS: IR Fluoro Guide CV Line Right  Result Date: 11/04/2022 CLINICAL DATA:  Renal failure, needs durable venous access for hemodialysis EXAM: TUNNELED HEMODIALYSIS CATHETER PLACEMENT WITH ULTRASOUND AND FLUOROSCOPIC GUIDANCE TECHNIQUE: The procedure, risks, benefits, and alternatives were explained to the patient. Questions regarding the procedure were encouraged and answered. The patient understands and consents to the procedure. As antibiotic prophylaxis, vancomycin 1 g was ordered pre-procedure and administered intravenously within one hour  of incision.Patency of the right IJ vein was confirmed with ultrasound with image documentation. An appropriate skin site was determined. Region was prepped using maximum barrier technique including cap and mask, sterile gown, sterile gloves, large sterile sheet, and Chlorhexidine as cutaneous antisepsis. The region was infiltrated locally with 1% lidocaine. Intravenous Fentanyl and Versed 1mg  were administered as conscious sedation during continuous monitoring of the patient's level of consciousness and physiological / cardiorespiratory status by the radiology RN, with a total moderate sedation time of 11 minutes. Under real-time ultrasound guidance, the right IJ vein was accessed with a 21 gauge micropuncture needle; the needle tip within the vein was confirmed with ultrasound image documentation. Needle exchanged over the 018 guidewire for transitional dilator, which allowed advancement of a Benson wire into the IVC. Over this, an MPA catheter was advanced. A Palindrome 19 hemodialysis catheter was tunneled from the right anterior chest wall approach to the right IJ dermatotomy site. The MPA catheter was exchanged over an Amplatz wire for serial vascular dilators which allow placement of a peel-away sheath, through which the catheter was advanced under intermittent fluoroscopy, positioned with its tips in the proximal and midright atrium. Spot chest radiograph confirms good catheter position. No pneumothorax. Catheter was flushed and primed per protocol. Catheter secured externally with O Prolene sutures. The right IJ dermatotomy site was closed with Dermabond. COMPLICATIONS: COMPLICATIONS None immediate FLUOROSCOPY: Radiation Exposure Index (as provided by the fluoroscopic device): 5 mGy air Kerma COMPARISON:  None Available. IMPRESSION: 1. Technically successful placement of  tunneled right IJ hemodialysis catheter with ultrasound and fluoroscopic guidance. Ready for routine use. ACCESS: Remains  approachable for percutaneous intervention as needed. Electronically Signed   By: Corlis Leak M.D.   On: 11/04/2022 12:19   IR US Guide Vasc Access Right  Result Date: 11/04/2022 CLINICAL DATA:  Renal failure, needs durable venous access for hemodialysis EXAM: TUNNELED HEMODIALYSIS CATHETER PLACEMENT WITH ULTRASOUND AND FLUOROSCOPIC GUIDANCE TECHNIQUE: The procedure, risks, benefits, and alternatives were explained to the patient. Questions regarding the procedure were encouraged and answered. The patient understands and consents to the procedure. As antibiotic prophylaxis, vancomycin 1 g was ordered pre-procedure and administered intravenously within one hour of incision.Patency of the right IJ vein was confirmed with ultrasound with image documentation. An appropriate skin site was determined. Region was prepped using maximum barrier technique including cap and mask, sterile gown, sterile gloves, large sterile sheet, and Chlorhexidine as cutaneous antisepsis. The region was infiltrated locally with 1% lidocaine. Intravenous Fentanyl and Versed 1mg  were administered as conscious sedation during continuous monitoring of the patient's level of consciousness and physiological / cardiorespiratory status by the radiology RN, with a total moderate sedation time of 11 minutes. Under real-time ultrasound guidance, the right IJ vein was accessed with a 21 gauge micropuncture needle; the needle tip within the vein was confirmed with ultrasound image documentation. Needle exchanged over the 018 guidewire for transitional dilator, which allowed advancement of a Benson wire into the IVC. Over this, an MPA catheter was advanced. A Palindrome 19 hemodialysis catheter was tunneled from the right anterior chest wall approach to the right IJ dermatotomy site. The MPA catheter was exchanged over an Amplatz wire for serial vascular dilators which allow placement of a peel-away sheath, through which the catheter was advanced  under intermittent fluoroscopy, positioned with its tips in the proximal and midright atrium. Spot chest radiograph confirms good catheter position. No pneumothorax. Catheter was flushed and primed per protocol. Catheter secured externally with O Prolene sutures. The right IJ dermatotomy site was closed with Dermabond. COMPLICATIONS: COMPLICATIONS None immediate FLUOROSCOPY: Radiation Exposure Index (as provided by the fluoroscopic device): 5 mGy air Kerma COMPARISON:  None Available. IMPRESSION: 1. Technically successful placement of tunneled right IJ hemodialysis catheter with ultrasound and fluoroscopic guidance. Ready for routine use. ACCESS: Remains approachable for percutaneous intervention as needed. Electronically Signed   By: Corlis Leak M.D.   On: 11/04/2022 12:19     LOS: 11 days   Jeoffrey Massed, MD  [facility]    To contact the attending provider between 7A-7P or the covering provider during after hours 7P-7A, please log into the web site [email or web address] and access using universal [place] password for that web site. If you do not have the password, please call the hospital operator.  11/05/2022, 12:54 PM

## 2022-11-05 NOTE — Progress Notes (Signed)
Physical Therapy Treatment Patient Details Name: Troy Johnston MRN: [id number] DOB: [birth year] Today's Date: 11/05/2022   History of Present Illness 72 yo male admitted 7/23 with SOB and shock. 7/24 PEA arrest with ROSC after 10 min ACLS, NSTEMI. Intubated 7/24-7/25. 7/25 - 7/27 CRRT. PMhx: T2DM, CKD, HTN, prostate CA    PT Comments  Patient agreeable to work on gait with a cane and states he has multiple canes at home. Patient did very well and met all previous goals with goals updated.     If plan is discharge home, recommend the following: A little help with walking and/or transfers;A little help with bathing/dressing/bathroom;Assistance with cooking/housework;Assist for transportation   Can travel by private vehicle        Equipment Recommendations  None recommended by PT    Recommendations for Other Services       Precautions / Restrictions Precautions Precautions: Fall Precaution Comments: Enteric Required Braces or Orthoses: Other Brace Other Brace: pt reports he uses L ankle brace Restrictions Weight Bearing Restrictions: No     Mobility  Bed Mobility Overal bed mobility: Modified Independent Bed Mobility: Supine to Sit     Supine to sit: Modified independent (Device/Increase time), HOB elevated     General bed mobility comments: pt using bed features/rails    Transfers Overall transfer level: Modified independent Equipment used: Straight cane Transfers: Sit to/from Stand Sit to Stand: Modified independent (Device/Increase time)           General transfer comment: no imbalance with holding cane    Ambulation/Gait Ambulation/Gait assistance: Min guard Gait Distance (Feet): 250 Feet Assistive device: Straight cane Gait Pattern/deviations: Step-through pattern, Decreased stride length, Wide base of support, Staggering left       General Gait Details: pt initially using cane in his left hand with incr left lateral sway and decr shift over RLE; had  pt switch can to his right hand and he walked more in midline; no imbalance with use of cane and left ankle brace (pt demonstrated able to don brace)   Stairs             Wheelchair Mobility     Tilt Bed    Modified Rankin (Stroke Patients Only)       Balance Overall balance assessment: Needs assistance Sitting-balance support: No upper extremity supported, Feet supported Sitting balance-Leahy Scale: Good Sitting balance - Comments: EOB   Standing balance support: No upper extremity supported, During functional activity Standing balance-Leahy Scale: Fair                              Cognition Arousal/Alertness: Awake/alert Behavior During Therapy: WFL for tasks assessed/performed Overall Cognitive Status: Within Functional Limits for tasks assessed                                          Exercises      General Comments        Pertinent Vitals/Pain Pain Assessment Pain Assessment: No/denies pain Faces Pain Scale: No hurt    Home Living                          Prior Function            PT Goals (current goals can now be found in the care plan section)  Acute Rehab PT Goals Patient Stated Goal: return home PT Goal Formulation: With patient Time For Goal Achievement: 11/19/22 Potential to Achieve Goals: Good Progress towards PT goals: Goals met and updated - see care plan    Frequency    Min 1X/week      PT Plan Current plan remains appropriate    Co-evaluation              AM-PAC PT "6 Clicks" Mobility   Outcome Measure  Help needed turning from your back to your side while in a flat bed without using bedrails?: None Help needed moving from lying on your back to sitting on the side of a flat bed without using bedrails?: None Help needed moving to and from a bed to a chair (including a wheelchair)?: A Little Help needed standing up from a chair using your arms (e.g., wheelchair or bedside  chair)?: None Help needed to walk in hospital room?: A Little Help needed climbing 3-5 steps with a railing? : A Little 6 Click Score: 21    End of Session Equipment Utilized During Treatment: Gait belt Activity Tolerance: Patient tolerated treatment well Patient left: with call bell/phone within reach;in bed (sitting EOB per pt's request; bed alarm not set on arrival)   PT Visit Diagnosis: Other abnormalities of gait and mobility (R26.89);Muscle weakness (generalized) (M62.81);Difficulty in walking, not elsewhere classified (R26.2)     Time: 2952-8413 PT Time Calculation (min) (ACUTE ONLY): 25 min  Charges:    $Gait Training: 23-37 mins PT General Charges $$ ACUTE PT VISIT: 1 Visit                      Jerolyn Center, PT Acute Rehabilitation Services  Office [phone]    Zena Amos 11/05/2022, 10:42 AM

## 2022-11-05 NOTE — Progress Notes (Addendum)
ANTICOAGULATION CONSULT NOTE - Follow Up  Pharmacy Consult for Heparin>apixaban Indication: afib  Allergies  Allergen Reactions   Ferrous Sulfate Other (See Comments)   Penicillins Hives and Other (See Comments)    Has patient had a PCN reaction causing immediate rash, facial/tongue/throat swelling, SOB or lightheadedness with hypotension: no Has patient had a PCN reaction causing severe rash involving mucus membranes or skin necrosis: no Has patient had a PCN reaction that required hospitalization: yes Has patient had a PCN reaction occurring within the last 10 years: no If all of the above answers are "NO", then may proceed with Cephalosporin use.    Patient Measurements: Height: 5\' 8"  (172.7 cm) Weight: 102.2 kg (225 lb 5 oz) IBW/kg (Calculated) : 68.4 Heparin Dosing Weight: 88.3 kg  Vital Signs: Temp: 98.6 F (37 C) (08/03 0855) Temp Source: Oral (08/03 1228) BP: 161/61 (08/03 0855) Pulse Rate: 65 (08/03 0855)  Labs: Recent Labs    11/03/22 0840 11/03/22 0842 11/04/22 0227 11/05/22 0330  HGB 8.1*  --  8.2* 8.9*  HCT 25.5*  --  26.6* 28.8*  PLT 169  --  166 233  HEPARINUNFRC  --  0.49 0.64 0.67  CREATININE 3.52*  --  3.82* 3.23*    Estimated Creatinine Clearance: 23.6 mL/min (A) (by C-G formula based on SCr of 3.23 mg/dL (H)).   Medical History: Past Medical History:  Diagnosis Date   Diabetes mellitus without complication (HCC)    GERD (gastroesophageal reflux disease)    Hypertension    Prostate cancer (HCC)     Medications:  Medications Prior to Admission  Medication Sig Dispense Refill Last Dose   allopurinol (ZYLOPRIM) 100 MG tablet Take 50 mg by mouth every other day. For high uric acid level   unk   Alogliptin Benzoate 12.5 MG TABS Take 6.25 mg by mouth every morning. For diabetes (replaces metformin)   unk   atorvastatin (LIPITOR) 20 MG tablet Take 20 mg by mouth at bedtime. For cholesterol   unk   carvedilol (COREG) 25 MG tablet Take 12.5 mg by  mouth 2 (two) times daily with a meal.   un   glipiZIDE (GLUCOTROL) 10 MG tablet Take 5 mg by mouth 2 (two) times daily before a meal. For diabetes   unk   hydrALAZINE (APRESOLINE) 100 MG tablet Take 100 mg by mouth every 8 (eight) hours.   unk   losartan (COZAAR) 100 MG tablet Take 100 mg by mouth daily.   unk   Scheduled:   apixaban  5 mg Oral BID   Chlorhexidine Gluconate Cloth  6 each Topical Daily   Chlorhexidine Gluconate Cloth  6 each Topical Q0600   docusate sodium  100 mg Oral BID   feeding supplement  237 mL Oral TID BM   insulin aspart  0-15 Units Subcutaneous TID WC   insulin aspart  0-5 Units Subcutaneous QHS   [START ON 11/06/2022] insulin glargine-yfgn  6 Units Subcutaneous Daily   multivitamin  1 tablet Oral QHS   pantoprazole  40 mg Oral QHS   polyethylene glycol  17 g Oral Daily   sodium chloride flush  10-40 mL Intracatheter Q12H   Infusions:   sodium chloride Stopped (10/30/22 1501)   sodium chloride     PRN: Place/Maintain arterial line **AND** sodium chloride, acetaminophen, docusate sodium, mouth rinse, polyethylene glycol, sodium chloride flush  Assessment: 71 yom with history of DM, prostate cancer remission, PUD and GIB presenting with SOB. Heparin per pharmacy consult placed for  chest pain/ACS - planned 48 hrs of treatment for NSTEMI, but now heparin continuing for new afib. Patient is not on anticoagulation PTA.  Heparin held 7/24 AM after cardiac arrest. Cleared by CCM to restart later in the day 7/24. 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 S/p CRRT 7/25-7/27. Monitoring for renal recovery.  Switch to apixaban today. Age<80, wt>60kg, ESRD  Goal of Therapy:  Monitor platelets by anticoagulation protocol: Yes   Plan:  Dc heparin Apixaban 5mg  PO BID Rx will follow peripherally  Ulyses Southward, PharmD, BCIDP, AAHIVP, CPP Infectious Disease Pharmacist 11/05/2022 1:00  PM   Addendum  Pt is now off of abx. Ok to replace iron with Ferrlecit 250mg  IV qday x4 per Dr. Jerral Ralph.  Ulyses Southward, PharmD, BCIDP, AAHIVP, CPP Infectious Disease Pharmacist 11/05/2022 1:10 PM

## 2022-11-06 DIAGNOSIS — N179 Acute kidney failure, unspecified: Secondary | ICD-10-CM | POA: Diagnosis not present

## 2022-11-06 DIAGNOSIS — N189 Chronic kidney disease, unspecified: Secondary | ICD-10-CM | POA: Diagnosis not present

## 2022-11-06 LAB — GLUCOSE, CAPILLARY
Glucose-Capillary: 106 mg/dL — ABNORMAL HIGH (ref 70–99)
Glucose-Capillary: 77 mg/dL (ref 70–99)
Glucose-Capillary: 84 mg/dL (ref 70–99)
Glucose-Capillary: 100 mg/dL — ABNORMAL HIGH (ref 70–99)
Glucose-Capillary: 142 mg/dL — ABNORMAL HIGH (ref 70–99)

## 2022-11-06 NOTE — Progress Notes (Signed)
[facility] Progress Note   Assessment/ Plan:   Troy Johnston is a/an 72 y.o. male with a past medical history significant for HTN, DM2, CKD, admitted for septic shock and enterobacter bacteremia.         AKI on CKD IV:  likely hemodynamically mediated in the setting of shock.              - started CRRT on 7/25 and stopped on 7/27             -Temporary dialysis catheter in place, appreciate help from CCM             -IV Lasix trial 7/28, did well and Cr plateaued--however Cr has been inching up day by day  - IV Lasix 7/30  - HD 7/31, 8/2  - s/p [facility] 11/04/22  - will need CLIP- will d/w renal navigator  - next HD Monday 8/5   2.  Shock: septic with enterobacter bacteremia             - abx per primary             - TTE no obvious vegetation              -No longer requiring pressors    3.  Elevated troponins/ NSTEMI/PEA arrest/Afib w/ RVR             - cardiology following             - anticoagulation per primary--> now getting switched from hep gtt to Eliquis   4.  Acute hypoxic RF:             - now extubated and much improved   5.  Elevated LFTs; improving             - most likely d/t tissue ischemia   6.  Thrombocytopenia: stable             - likely in setting of septic shock  Subjective:    Seen in room.  No complaints.   Objective:   BP (!) 176/60 (BP Location: Left Arm)   Pulse 63   Temp 98.4 F (36.9 C) (Oral)   Resp 12   Ht 5\' 8"  (1.727 m)   Wt 98.2 kg   SpO2 96%   BMI 32.92 kg/m   Intake/Output Summary (Last 24 hours) at 11/06/2022 0911 Last data filed at 11/06/2022 0854 Gross per 24 hour  Intake 3 ml  Output --  Net 3 ml    Weight change:   Physical Exam: Gen:NAD CVS: RRR Resp: muffled at bases Abd: soft Ext: no LE edema ACCESS: R internal jugular tunneled HD cath  Imaging: No results found.  Labs: BMET Recent Labs  Lab 10/31/22 0447 11/01/22 0404 11/02/22 0004 11/03/22 0840 11/04/22 0227 11/05/22 0330  NA  135 135 135 132* 136 134*  K 3.3* 3.8 4.1 3.7 3.9 4.2  CL 101 100 99 97* 101 98  CO2 23 23 24 28 24 26   GLUCOSE 192* 153* 168* 121* 108* 93  BUN 81* 87* 86* 47* 49* 30*  CREATININE 3.97* 4.32* 4.63* 3.52* 3.82* 3.23*  CALCIUM 7.5* 7.7* 7.8* 7.5* 8.0* 8.3*  PHOS 4.2 4.6  --   --  5.0* 4.3   CBC Recent Labs  Lab 11/02/22 0004 11/03/22 0840 11/04/22 0227 11/05/22 0330  WBC 9.1 6.1 5.9 5.3  HGB 8.7* 8.1* 8.2* 8.9*  HCT 27.2* 25.5* 26.6* 28.8*  MCV 85.0  84.2 85.0 87.5  PLT 141* 169 166 233    Medications:     apixaban  5 mg Oral BID   Chlorhexidine Gluconate Cloth  6 each Topical Daily   Chlorhexidine Gluconate Cloth  6 each Topical Q0600   docusate sodium  100 mg Oral BID   feeding supplement  237 mL Oral TID BM   insulin aspart  0-15 Units Subcutaneous TID WC   insulin aspart  0-5 Units Subcutaneous QHS   insulin glargine-yfgn  6 Units Subcutaneous Daily   multivitamin  1 tablet Oral QHS   pantoprazole  40 mg Oral QHS   polyethylene glycol  17 g Oral Daily   sodium chloride flush  10-40 mL Intracatheter Q12H   Bufford Buttner MD 11/06/2022, 9:11 AM

## 2022-11-06 NOTE — Plan of Care (Signed)

## 2022-11-06 NOTE — Progress Notes (Signed)
PROGRESS NOTE        PATIENT DETAILS Name: Troy Johnston Age: 72 y.o. Sex: male Date of Birth: 1950/04/07 Admit Date: 10/25/2022 Admitting Physician Oretha Milch, MD BJY:NWGNFA, Lenn Sink  Brief Summary: Patient is a 72 y.o.  male with history of CKD 4, HTN, DM-2 who presented with dyspnea/altered mental status-found to have multifactorial shock (septic and cardiogenic), AKI-Hospital course complicated by PEA arrest-with ROSC after 10 minutes.  Stabilized in the ICU-evaluated by advanced heart failure team-and nephrology-subsequently transferred to [facility] on 7/30.  See below for further details.  Significant events: 07/23>>: Admission to ICU 07/24>> AF-RVR >> amio gtt ,Placement of HD cath, PEA arrest ,intubated ,ROSC after 10 minutes , EF low at 20% 07/25>> Patient extubated, started CRRT , hypotensive and given amiodarone again 07/26>> weaned off Levophed 07/27>> CRRT stopped 07/30>> transferred to [facility] 07/31>> worsening creatinine-underwent HD.  Significant studies: 7/23>> CXR: Possible retrocardiac opacities 7/23>> renal ultrasound: Mild right-sided hydronephrosis 7/24>> echo: EF 20-25%, RV systolic function severely reduced. 7/29>> repeat limited echo: EF 40-45%, RV systolic function normal  Significant microbiology data: 7/23>> COVID/influenza/RSV PCR: Negative 7/23>> blood culture: Enterobacter cloacae 7/24>> stool C. difficile: Negative 7/24>> stool GI pathogen panel: Norovirus  Procedures: 7/24-7/25>> ETT 8/02>> tunneled dialysis catheter by IR  Consults: Cardiology Nephrology PCCM  Subjective: No complaints-no new issues overnight.    Objective: Vitals: Blood pressure (!) 176/60, pulse 63, temperature 98.4 F (36.9 C), temperature source Oral, resp. rate 12, height 5\' 8"  (1.727 m), weight 98.2 kg, SpO2 96%.   Exam: Gen Exam:Alert awake-not in any distress HEENT:atraumatic, normocephalic Chest: B/L clear to auscultation  anteriorly CVS:S1S2 regular Abdomen:soft non tender, non distended Extremities:no edema Neurology: Non focal Skin: no rash  Pertinent Labs/Radiology:    Latest Ref Rng & Units 11/05/2022    3:30 AM 11/04/2022    2:27 AM 11/03/2022    8:40 AM  CBC  WBC 4.0 - 10.5 K/uL 5.3  5.9  6.1   Hemoglobin 13.0 - 17.0 g/dL 8.9  8.2  8.1   Hematocrit 39.0 - 52.0 % 28.8  26.6  25.5   Platelets 150 - 400 K/uL 233  166  169     Lab Results  Component Value Date   NA 134 (L) 11/05/2022   K 4.2 11/05/2022   CL 98 11/05/2022   CO2 26 11/05/2022      Assessment/Plan: Multifactorial shock-septic and cardiogenic shock Enterobacter cloacae bacteremia Norovirus infection Shock physiology significantly better-BP stable Completed course of antibiotics. Continue to monitor off antibiotics  Acute biventricular systolic heart failure Volume status improved with dialysis. Repeat echo on 7/29 with improvement in LVEF and RV systolic function Cardiology had contemplated starting GDMT medications-but since patient has been started on intermittent hemodialysis-and at risk for blood pressure drops-cardiology holding off on starting GDMT meds.   PEA cardiac arrest V-fib s/p defibrillation Paroxysmal atrial fibrillation Cardiac arrest felt to be in the setting of septic shock Did not tolerate amiodarone-thankfully maintaining sinus rhythm Was on IV heparin-since no more procedures planned-stop IV heparin-transition to Eliquis.  AKI on CKD 4 Hemodynamically mediated in the setting of hypotension/shock Volume status stabilized-however renal function worsened-patient has been started on intermittent HD. TDC placed by IR on 8/2 Renal navigator following for outpatient HD placement. Continue to monitor for renal recovery.      Acute hypoxic respiratory failure  Intubated in the setting of cardiac arrest-extubated on 7/25-currently doing well on room air.  Shock liver Secondary to septic shock-significant  improvement in liver enzymes. Follow liver enzymes periodically  DM-2 (A1c 7.5 on 7/24) CBG stable but on the lower side-decrease Semglee to 6 units daily-remains on SSI.    Recent Labs    11/05/22 2102 11/06/22 0347 11/06/22 0809  GLUCAP 103* 106* 77     HLD Statin on hold due to elevated LFTs  HTN BP stable but slowly creeping up-reassess on 8/4-Coreg/hydralazine/losartan on hold for now.  Nutrition Status: Nutrition Problem: Increased nutrient needs Etiology: acute illness (AKI on CRRT) Signs/Symptoms: estimated needs Interventions: Ensure Enlive (each supplement provides 350kcal and 20 grams of protein), MVI, Liberalize Diet  Obesity: Estimated body mass index is 32.92 kg/m as calculated from the following:   Height as of this encounter: 5\' 8"  (1.727 m).   Weight as of this encounter: 98.2 kg.   Code status:   Code Status: DNR   DVT Prophylaxis: SCDs Start: 10/25/22 1759 IV Heparin apixaban (ELIQUIS) tablet 5 mg    Family Communication: Nephew-[phone] updated 8/2   Disposition Plan: Status is: Inpatient Remains inpatient appropriate because: Severity of illness   Planned Discharge Destination:Home health once outpatient HD chair arrangements have been made.  Hopefully in the next 1-2 days.   Diet: Diet Order             Diet regular Room service appropriate? Yes; Fluid consistency: Thin; Fluid restriction: 1200 mL Fluid  Diet effective now                     Antimicrobial agents: Anti-infectives (From admission, onward)    Start     Dose/Rate Route Frequency Ordered Stop   11/04/22 0819  vancomycin (VANCOCIN) IVPB 1000 mg/200 mL premix        over 60 Minutes Intravenous Continuous PRN 11/04/22 0819 11/04/22 0819   10/31/22 0600  meropenem (MERREM) 1 g in sodium chloride 0.9 % 100 mL IVPB        1 g 200 mL/hr over 30 Minutes Intravenous Every 24 hours 10/30/22 0835 11/01/22 0645   10/27/22 1400  meropenem (MERREM) 1 g in sodium  chloride 0.9 % 100 mL IVPB  Status:  Discontinued        1 g 200 mL/hr over 30 Minutes Intravenous Every 8 hours 10/27/22 1122 10/30/22 0835   10/26/22 1000  meropenem (MERREM) 1 g in sodium chloride 0.9 % 100 mL IVPB  Status:  Discontinued        1 g 200 mL/hr over 30 Minutes Intravenous Daily 10/26/22 0825 10/27/22 1122   10/26/22 0500  aztreonam (AZACTAM) 2 g in sodium chloride 0.9 % 100 mL IVPB  Status:  Discontinued        2 g 200 mL/hr over 30 Minutes Intravenous Every 12 hours 10/26/22 0325 10/26/22 0825   10/26/22 0415  vancomycin (VANCOREADY) IVPB 1500 mg/300 mL  Status:  Discontinued        1,500 mg 150 mL/hr over 120 Minutes Intravenous Every 24 hours 10/26/22 0325 10/26/22 0359   10/26/22 0415  metroNIDAZOLE (FLAGYL) IVPB 500 mg  Status:  Discontinued        500 mg 100 mL/hr over 60 Minutes Intravenous Every 8 hours 10/26/22 0325 10/26/22 0829   10/26/22 0357  vancomycin variable dose per unstable renal function (pharmacist dosing)  Status:  Discontinued         Does not apply  See admin instructions 10/26/22 0358 10/26/22 0829   10/25/22 1645  vancomycin (VANCOREADY) IVPB 2000 mg/400 mL        2,000 mg 200 mL/hr over 120 Minutes Intravenous  Once 10/25/22 1632 10/25/22 2132   10/25/22 1615  aztreonam (AZACTAM) 2 g in sodium chloride 0.9 % 100 mL IVPB        2 g 200 mL/hr over 30 Minutes Intravenous  Once 10/25/22 1607 10/25/22 1924   10/25/22 1615  metroNIDAZOLE (FLAGYL) IVPB 500 mg        500 mg 100 mL/hr over 60 Minutes Intravenous  Once 10/25/22 1607 10/25/22 1728   10/25/22 1615  vancomycin (VANCOCIN) IVPB 1000 mg/200 mL premix  Status:  Discontinued        1,000 mg 200 mL/hr over 60 Minutes Intravenous  Once 10/25/22 1607 10/25/22 1632        MEDICATIONS: Scheduled Meds:  apixaban  5 mg Oral BID   Chlorhexidine Gluconate Cloth  6 each Topical Daily   Chlorhexidine Gluconate Cloth  6 each Topical Q0600   docusate sodium  100 mg Oral BID   feeding supplement   237 mL Oral TID BM   insulin aspart  0-15 Units Subcutaneous TID WC   insulin aspart  0-5 Units Subcutaneous QHS   insulin glargine-yfgn  6 Units Subcutaneous Daily   multivitamin  1 tablet Oral QHS   pantoprazole  40 mg Oral QHS   polyethylene glycol  17 g Oral Daily   sodium chloride flush  10-40 mL Intracatheter Q12H   Continuous Infusions:  sodium chloride Stopped (10/30/22 1501)   sodium chloride     ferric gluconate (FERRLECIT) IVPB 250 mg (11/05/22 1805)   PRN Meds:.Place/Maintain arterial line **AND** sodium chloride, acetaminophen, docusate sodium, mouth rinse, polyethylene glycol, sodium chloride flush   I have personally reviewed following labs and imaging studies  LABORATORY DATA: CBC: Recent Labs  Lab 11/01/22 0404 11/02/22 0004 11/03/22 0840 11/04/22 0227 11/05/22 0330  WBC 14.9* 9.1 6.1 5.9 5.3  HGB 8.7* 8.7* 8.1* 8.2* 8.9*  HCT 26.4* 27.2* 25.5* 26.6* 28.8*  MCV 83.3 85.0 84.2 85.0 87.5  PLT 124* 141* 169 166 233    Basic Metabolic Panel: Recent Labs  Lab 10/31/22 0447 10/31/22 0448 11/01/22 0404 11/02/22 0004 11/03/22 0840 11/04/22 0227 11/05/22 0330  NA 135  --  135 135 132* 136 134*  K 3.3*  --  3.8 4.1 3.7 3.9 4.2  CL 101  --  100 99 97* 101 98  CO2 23  --  23 24 28 24 26   GLUCOSE 192*  --  153* 168* 121* 108* 93  BUN 81*  --  87* 86* 47* 49* 30*  CREATININE 3.97*  --  4.32* 4.63* 3.52* 3.82* 3.23*  CALCIUM 7.5*  --  7.7* 7.8* 7.5* 8.0* 8.3*  MG  --  2.4  --   --   --   --   --   PHOS 4.2  --  4.6  --   --  5.0* 4.3    GFR: Estimated Creatinine Clearance: 23.1 mL/min (A) (by C-G formula based on SCr of 3.23 mg/dL (H)).  Liver Function Tests: Recent Labs  Lab 10/31/22 0447 11/01/22 0404 11/03/22 0840 11/04/22 0227 11/05/22 0330  AST  --   --  36  --   --   ALT  --   --  98*  --   --   ALKPHOS  --   --  81  --   --  BILITOT  --   --  0.9  --   --   PROT  --   --  6.1*  --   --   ALBUMIN 2.1* 2.4* 2.2* 2.4* 2.6*   No results  for input(s): "LIPASE", "AMYLASE" in the last 168 hours. No results for input(s): "AMMONIA" in the last 168 hours.  Coagulation Profile: No results for input(s): "INR", "PROTIME" in the last 168 hours.  Cardiac Enzymes: No results for input(s): "CKTOTAL", "CKMB", "CKMBINDEX", "TROPONINI" in the last 168 hours.  BNP (last 3 results) No results for input(s): "PROBNP" in the last 8760 hours.  Lipid Profile: No results for input(s): "CHOL", "HDL", "LDLCALC", "TRIG", "CHOLHDL", "LDLDIRECT" in the last 72 hours.  Thyroid Function Tests: No results for input(s): "TSH", "T4TOTAL", "FREET4", "T3FREE", "THYROIDAB" in the last 72 hours.  Anemia Panel: No results for input(s): "VITAMINB12", "FOLATE", "FERRITIN", "TIBC", "IRON", "RETICCTPCT" in the last 72 hours.  Urine analysis:    Component Value Date/Time   COLORURINE YELLOW 10/14/2013 1506   APPEARANCEUR CLEAR 10/14/2013 1506   LABSPEC 1.017 10/14/2013 1506   PHURINE 5.0 10/14/2013 1506   GLUCOSEU NEGATIVE 10/14/2013 1506   HGBUR TRACE (A) 10/14/2013 1506   BILIRUBINUR NEGATIVE 10/14/2013 1506   KETONESUR NEGATIVE 10/14/2013 1506   PROTEINUR 30 (A) 10/14/2013 1506   UROBILINOGEN 0.2 10/14/2013 1506   NITRITE NEGATIVE 10/14/2013 1506   LEUKOCYTESUR NEGATIVE 10/14/2013 1506    Sepsis Labs: Lactic Acid, Venous    Component Value Date/Time   LATICACIDVEN 2.7 (HH) 10/27/2022 0812    MICROBIOLOGY: No results found for this or any previous visit (from the past 240 hour(s)).   RADIOLOGY STUDIES/RESULTS: No results found.   LOS: 12 days   Jeoffrey Massed, MD  [facility]    To contact the attending provider between 7A-7P or the covering provider during after hours 7P-7A, please log into the web site [email or web address] and access using universal [place] password for that web site. If you do not have the password, please call the hospital operator.  11/06/2022, 11:20 AM

## 2022-11-06 NOTE — Discharge Instructions (Signed)
Information on my medicine - ELIQUIS (apixaban)  This medication education was reviewed with me or my healthcare representative as part of my discharge preparation.  The pharmacist that spoke with me during my hospital stay was:  Merla Riches, [facility]  Why was Eliquis prescribed for you? Eliquis was prescribed for you to reduce the risk of a blood clot forming that can cause a stroke if you have a medical condition called atrial fibrillation (a type of irregular heartbeat).  What do You need to know about Eliquis ? Take your Eliquis TWICE DAILY - one tablet in the morning and one tablet in the evening with or without food. If you have difficulty swallowing the tablet whole please discuss with your pharmacist how to take the medication safely.  Take Eliquis exactly as prescribed by your doctor and DO NOT stop taking Eliquis without talking to the doctor who prescribed the medication.  Stopping may increase your risk of developing a stroke.  Refill your prescription before you run out.  After discharge, you should have regular check-up appointments with your healthcare provider that is prescribing your Eliquis.  In the future your dose may need to be changed if your kidney function or weight changes by a significant amount or as you get older.  What do you do if you miss a dose? If you miss a dose, take it as soon as you remember on the same day and resume taking twice daily.  Do not take more than one dose of ELIQUIS at the same time to make up a missed dose.  Important Safety Information A possible side effect of Eliquis is bleeding. You should call your healthcare provider right away if you experience any of the following: Bleeding from an injury or your nose that does not stop. Unusual colored urine (red or dark brown) or unusual colored stools (red or black). Unusual bruising for unknown reasons. A serious fall or if you hit your head (even if there is no bleeding).  Some  medicines may interact with Eliquis and might increase your risk of bleeding or clotting while on Eliquis. To help avoid this, consult your healthcare provider or pharmacist prior to using any new prescription or non-prescription medications, including herbals, vitamins, non-steroidal anti-inflammatory drugs (NSAIDs) and supplements.  This website has more information on Eliquis (apixaban): http://www.eliquis.com/eliquis/home

## 2022-11-07 DIAGNOSIS — N189 Chronic kidney disease, unspecified: Secondary | ICD-10-CM | POA: Diagnosis not present

## 2022-11-07 DIAGNOSIS — N179 Acute kidney failure, unspecified: Secondary | ICD-10-CM | POA: Diagnosis not present

## 2022-11-07 LAB — CBC WITH DIFFERENTIAL/PLATELET
Abs Immature Granulocytes: 0.02 10*3/uL (ref 0.00–0.07)
Basophils Absolute: 0.1 10*3/uL (ref 0.0–0.1)
Basophils Relative: 1 %
Eosinophils Absolute: 0.1 10*3/uL (ref 0.0–0.5)
Eosinophils Relative: 1 %
HCT: 25.7 % — ABNORMAL LOW (ref 39.0–52.0)
Hemoglobin: 7.8 g/dL — ABNORMAL LOW (ref 13.0–17.0)
Immature Granulocytes: 0 %
Lymphocytes Relative: 34 %
Lymphs Abs: 1.5 10*3/uL (ref 0.7–4.0)
MCH: 26.6 pg (ref 26.0–34.0)
MCHC: 30.4 g/dL (ref 30.0–36.0)
MCV: 87.7 fL (ref 80.0–100.0)
Monocytes Absolute: 0.6 10*3/uL (ref 0.1–1.0)
Monocytes Relative: 14 %
Neutro Abs: 2.2 10*3/uL (ref 1.7–7.7)
Neutrophils Relative %: 50 %
Platelets: 247 10*3/uL (ref 150–400)
RBC: 2.93 MIL/uL — ABNORMAL LOW (ref 4.22–5.81)
RDW: 14.3 % (ref 11.5–15.5)
WBC: 4.5 10*3/uL (ref 4.0–10.5)
nRBC: 0 % (ref 0.0–0.2)

## 2022-11-07 LAB — GLUCOSE, CAPILLARY
Glucose-Capillary: 88 mg/dL (ref 70–99)
Glucose-Capillary: 98 mg/dL (ref 70–99)
Glucose-Capillary: 137 mg/dL — ABNORMAL HIGH (ref 70–99)
Glucose-Capillary: 169 mg/dL — ABNORMAL HIGH (ref 70–99)

## 2022-11-07 MED ORDER — DARBEPOETIN ALFA 150 MCG/0.3ML IJ SOSY
150.0000 ug | PREFILLED_SYRINGE | INTRAMUSCULAR | Status: DC
  Administered 2022-11-07: 150 ug via SUBCUTANEOUS
  Filled 2022-11-07: qty 0.3

## 2022-11-07 MED ORDER — LOSARTAN POTASSIUM 50 MG PO TABS
50.0000 mg | ORAL_TABLET | Freq: Every day | ORAL | Status: DC
  Administered 2022-11-07 – 2022-11-08 (×2): 50 mg via ORAL
  Filled 2022-11-07 (×2): qty 1

## 2022-11-07 MED ORDER — HEPARIN SODIUM (PORCINE) 1000 UNIT/ML IJ SOLN
INTRAMUSCULAR | Status: AC
  Administered 2022-11-07: 1000 [IU]
  Filled 2022-11-07: qty 4

## 2022-11-07 NOTE — Plan of Care (Signed)
  Problem: Education: Goal: Knowledge of General Education information will improve Description: Including pain rating scale, medication(s)/side effects and non-pharmacologic comfort measures Outcome: Progressing   Problem: Health Behavior/Discharge Planning: Goal: Ability to manage health-related needs will improve Outcome: Progressing   Problem: Clinical Measurements: Goal: Ability to maintain clinical measurements within normal limits will improve Outcome: Progressing Goal: Will remain free from infection Outcome: Progressing Goal: Diagnostic test results will improve Outcome: Progressing Goal: Respiratory complications will improve Outcome: Progressing Goal: Cardiovascular complication will be avoided Outcome: Progressing   Problem: Activity: Goal: Risk for activity intolerance will decrease Outcome: Progressing   Problem: Nutrition: Goal: Adequate nutrition will be maintained Outcome: Progressing   Problem: Coping: Goal: Level of anxiety will decrease Outcome: Progressing   Problem: Elimination: Goal: Will not experience complications related to bowel motility Outcome: Progressing Goal: Will not experience complications related to urinary retention Outcome: Progressing   Problem: Pain Managment: Goal: General experience of comfort will improve Outcome: Progressing   Problem: Safety: Goal: Ability to remain free from injury will improve Outcome: Progressing   Problem: Skin Integrity: Goal: Risk for impaired skin integrity will decrease Outcome: Progressing   Problem: Education: Goal: Ability to describe self-care measures that may prevent or decrease complications (Diabetes Survival Skills Education) will improve Outcome: Progressing Goal: Individualized Educational Video(s) Outcome: Progressing   Problem: Coping: Goal: Ability to adjust to condition or change in health will improve Outcome: Progressing   Problem: Fluid Volume: Goal: Ability to  maintain a balanced intake and output will improve Outcome: Progressing   Problem: Health Behavior/Discharge Planning: Goal: Ability to identify and utilize available resources and services will improve Outcome: Progressing Goal: Ability to manage health-related needs will improve Outcome: Progressing   Problem: Metabolic: Goal: Ability to maintain appropriate glucose levels will improve Outcome: Progressing   Problem: Nutritional: Goal: Maintenance of adequate nutrition will improve Outcome: Progressing Goal: Progress toward achieving an optimal weight will improve Outcome: Progressing   Problem: Skin Integrity: Goal: Risk for impaired skin integrity will decrease Outcome: Progressing   Problem: Tissue Perfusion: Goal: Adequacy of tissue perfusion will improve Outcome: Progressing   Problem: Activity: Goal: Ability to tolerate increased activity will improve Outcome: Progressing   Problem: Respiratory: Goal: Ability to maintain a clear airway and adequate ventilation will improve Outcome: Progressing   Problem: Role Relationship: Goal: Method of communication will improve Outcome: Progressing   

## 2022-11-07 NOTE — Progress Notes (Signed)
Blood sugar 88 this morning. of orange juice drank by pt.

## 2022-11-07 NOTE — Progress Notes (Signed)
   11/07/22 1314  Vitals  Temp 97.8 F (36.6 C)  Pulse Rate 68  Resp 18  BP (!) 140/83  SpO2 96 %  O2 Device Room Air  Weight 93.8 kg  Type of Weight (S)  Post-Dialysis (Weight Bed)  Oxygen Therapy  Patient Activity (if Appropriate) In bed  Pulse Oximetry Type Continuous  Post Treatment  Dialyzer Clearance Clear  Duration of HD Treatment -hour(s) 3 hour(s)  Hemodialysis Intake (mL) 0 mL  Liters Processed 54  Fluid Removed (mL) 1000 mL  Tolerated HD Treatment Yes  Post-Hemodialysis Comments Pt. tolerated procedure well. VSS, admin medication per order and report called to 5W bedside RN Marko Stai   Received patient in bed to unit.  Alert and oriented.  Informed consent signed and in chart.   TX duration:3  Patient tolerated well.  Transported back to the room  Alert, without acute distress.  Hand-off given to patient's nurse.   Access used: Yes Access issues: No  Total UF removed: 1000 ml Medication(s) given: See MAR Post HD VS: See Above Grid Post HD weight: 93.8 kg   [facility]

## 2022-11-07 NOTE — Progress Notes (Signed)
Contacted [facility] admissions to request an update on pt's referral. Will await response.   Olivia Canter Renal Navigator [phone]

## 2022-11-07 NOTE — Procedures (Signed)
Patient was seen on dialysis and the procedure was supervised.  BFR 300  Via TDC BP is  174/75.   Patient appears to be tolerating treatment well  Troy Johnston 11/07/2022

## 2022-11-07 NOTE — Progress Notes (Signed)
PROGRESS NOTE        PATIENT DETAILS Name: Troy Johnston Age: 72 y.o. Sex: male Date of Birth: Nov 21, 1950 Admit Date: 10/25/2022 Admitting Physician Oretha Milch, MD ZOX:[id number], Lenn Sink  Brief Summary: Patient is a 72 y.o.  male with history of CKD 4, HTN, DM-2 who presented with dyspnea/altered mental status-found to have multifactorial shock (septic and cardiogenic), AKI-Hospital course complicated by PEA arrest-with ROSC after 10 minutes.  Stabilized in the ICU-evaluated by advanced heart failure team-and nephrology-subsequently transferred to [facility] on 7/30.  See below for further details.  Significant events: 07/23>>: Admission to ICU 07/24>> AF-RVR >> amio gtt ,Placement of HD cath, PEA arrest ,intubated ,ROSC after 10 minutes , EF low at 20% 07/25>> Patient extubated, started CRRT , hypotensive and given amiodarone again 07/26>> weaned off Levophed 07/27>> CRRT stopped 07/30>> transferred to [facility] 07/31>> worsening creatinine-underwent HD.  Significant studies: 7/23>> CXR: Possible retrocardiac opacities 7/23>> renal ultrasound: Mild right-sided hydronephrosis 7/24>> echo: EF 20-25%, RV systolic function severely reduced. 7/29>> repeat limited echo: EF 40-45%, RV systolic function normal  Significant microbiology data: 7/23>> COVID/influenza/RSV PCR: Negative 7/23>> blood culture: Enterobacter cloacae 7/24>> stool C. difficile: Negative 7/24>> stool GI pathogen panel: Norovirus  Procedures: 7/24-7/25>> ETT 8/02>> tunneled dialysis catheter by IR  Consults: Cardiology Nephrology PCCM  Subjective: No complaint  Objective: Vitals: Blood pressure (!) 172/83, pulse (!) 56, temperature 97.8 F (36.6 C), resp. rate 12, height 5\' 8"  (1.727 m), weight 95.6 kg, SpO2 100%.   Exam: Gen Exam:Alert awake-not in any distress HEENT:atraumatic, normocephalic Chest: B/L clear to auscultation anteriorly CVS:S1S2 regular Abdomen:soft non  tender, non distended Extremities:no edema Neurology: Non focal Skin: no rash  Pertinent Labs/Radiology:    Latest Ref Rng & Units 11/07/2022    9:48 AM 11/05/2022    3:30 AM 11/04/2022    2:27 AM  CBC  WBC 4.0 - 10.5 K/uL 4.5  5.3  5.9   Hemoglobin 13.0 - 17.0 g/dL 7.8  8.9  8.2   Hematocrit 39.0 - 52.0 % 25.7  28.8  26.6   Platelets 150 - 400 K/uL 247  233  166     Lab Results  Component Value Date   NA 134 (L) 11/07/2022   K 4.0 11/07/2022   CL 103 11/07/2022   CO2 22 11/07/2022      Assessment/Plan: Multifactorial shock-septic and cardiogenic shock Enterobacter cloacae bacteremia Norovirus infection Shock physiology significantly better-BP stable Completed course of antibiotics. Continue to monitor off antibiotics  Acute biventricular systolic heart failure Volume status improved with dialysis. Repeat echo on 7/29 with improvement in LVEF and RV systolic function Cardiology had contemplated starting GDMT medications-but since patient has been started on intermittent hemodialysis-and at risk for blood pressure drops-cardiology holding off on starting GDMT meds.   PEA cardiac arrest V-fib s/p defibrillation Paroxysmal atrial fibrillation Cardiac arrest felt to be in the setting of septic shock Did not tolerate amiodarone-thankfully maintaining sinus rhythm Was on IV heparin-since no more procedures planned-stop IV heparin-transition to Eliquis.  AKI on CKD 4 Hemodynamically mediated in the setting of hypotension/shock Volume status stabilized-however renal function worsened-patient has been started on intermittent HD. TDC placed by IR on 8/2 Renal navigator following for outpatient HD placement. Continue to monitor for renal recovery.      Acute hypoxic respiratory failure Intubated in the setting of cardiac arrest-extubated  on 7/25-currently doing well on room air.  Shock liver Secondary to septic shock-significant improvement in liver enzymes. Follow liver  enzymes periodically  DM-2 (A1c 7.5 on 7/24) CBG stable but on the lower side-decrease Semglee to 6 units daily-remains on SSI.    Recent Labs    11/06/22 1538 11/06/22 2137 11/07/22 0747  GLUCAP 100* 142* 88     HLD Statin on hold due to elevated LFTs  HTN BP stable but slowly creeping up-reassess on 8/4-Coreg/hydralazine/losartan on hold for now.  Nutrition Status: Nutrition Problem: Increased nutrient needs Etiology: acute illness (AKI on CRRT) Signs/Symptoms: estimated needs Interventions: Ensure Enlive (each supplement provides 350kcal and 20 grams of protein), MVI, Liberalize Diet  Obesity: Estimated body mass index is 32.05 kg/m as calculated from the following:   Height as of this encounter: 5\' 8"  (1.727 m).   Weight as of this encounter: 95.6 kg.   Code status:   Code Status: DNR   DVT Prophylaxis: SCDs Start: 10/25/22 1759 IV Heparin apixaban (ELIQUIS) tablet 5 mg    Family Communication: Nephew-[phone] updated 8/2   Disposition Plan: Status is: Inpatient Remains inpatient appropriate because: Severity of illness   Planned Discharge Destination:Home health once outpatient HD chair arrangements have been made.  Hopefully in the next 1-2 days.   Diet: Diet Order             Diet regular Room service appropriate? Yes; Fluid consistency: Thin; Fluid restriction: 1200 mL Fluid  Diet effective now                     Antimicrobial agents: Anti-infectives (From admission, onward)    Start     Dose/Rate Route Frequency Ordered Stop   11/04/22 0819  vancomycin (VANCOCIN) IVPB 1000 mg/200 mL premix        over 60 Minutes Intravenous Continuous PRN 11/04/22 0819 11/04/22 0819   10/31/22 0600  meropenem (MERREM) 1 g in sodium chloride 0.9 % 100 mL IVPB        1 g 200 mL/hr over 30 Minutes Intravenous Every 24 hours 10/30/22 0835 11/01/22 0645   10/27/22 1400  meropenem (MERREM) 1 g in sodium chloride 0.9 % 100 mL IVPB  Status:  Discontinued         1 g 200 mL/hr over 30 Minutes Intravenous Every 8 hours 10/27/22 1122 10/30/22 0835   10/26/22 1000  meropenem (MERREM) 1 g in sodium chloride 0.9 % 100 mL IVPB  Status:  Discontinued        1 g 200 mL/hr over 30 Minutes Intravenous Daily 10/26/22 0825 10/27/22 1122   10/26/22 0500  aztreonam (AZACTAM) 2 g in sodium chloride 0.9 % 100 mL IVPB  Status:  Discontinued        2 g 200 mL/hr over 30 Minutes Intravenous Every 12 hours 10/26/22 0325 10/26/22 0825   10/26/22 0415  vancomycin (VANCOREADY) IVPB 1500 mg/300 mL  Status:  Discontinued        1,500 mg 150 mL/hr over 120 Minutes Intravenous Every 24 hours 10/26/22 0325 10/26/22 0359   10/26/22 0415  metroNIDAZOLE (FLAGYL) IVPB 500 mg  Status:  Discontinued        500 mg 100 mL/hr over 60 Minutes Intravenous Every 8 hours 10/26/22 0325 10/26/22 0829   10/26/22 0357  vancomycin variable dose per unstable renal function (pharmacist dosing)  Status:  Discontinued         Does not apply See admin instructions 10/26/22 0358 10/26/22 1610  10/25/22 1645  vancomycin (VANCOREADY) IVPB 2000 mg/400 mL        2,000 mg 200 mL/hr over 120 Minutes Intravenous  Once 10/25/22 1632 10/25/22 2132   10/25/22 1615  aztreonam (AZACTAM) 2 g in sodium chloride 0.9 % 100 mL IVPB        2 g 200 mL/hr over 30 Minutes Intravenous  Once 10/25/22 1607 10/25/22 1924   10/25/22 1615  metroNIDAZOLE (FLAGYL) IVPB 500 mg        500 mg 100 mL/hr over 60 Minutes Intravenous  Once 10/25/22 1607 10/25/22 1728   10/25/22 1615  vancomycin (VANCOCIN) IVPB 1000 mg/200 mL premix  Status:  Discontinued        1,000 mg 200 mL/hr over 60 Minutes Intravenous  Once 10/25/22 1607 10/25/22 1632        MEDICATIONS: Scheduled Meds:  apixaban  5 mg Oral BID   Chlorhexidine Gluconate Cloth  6 each Topical Daily   Chlorhexidine Gluconate Cloth  6 each Topical Q0600   darbepoetin (ARANESP) injection - DIALYSIS  150 mcg Subcutaneous Q Mon-1800   docusate sodium  100 mg Oral  BID   feeding supplement  237 mL Oral TID BM   heparin sodium (porcine)       insulin aspart  0-15 Units Subcutaneous TID WC   insulin aspart  0-5 Units Subcutaneous QHS   insulin glargine-yfgn  6 Units Subcutaneous Daily   losartan  50 mg Oral QHS   multivitamin  1 tablet Oral QHS   pantoprazole  40 mg Oral QHS   polyethylene glycol  17 g Oral Daily   sodium chloride flush  10-40 mL Intracatheter Q12H   Continuous Infusions:  sodium chloride Stopped (10/30/22 1501)   sodium chloride     ferric gluconate (FERRLECIT) IVPB Stopped (11/06/22 1459)   PRN Meds:.Place/Maintain arterial line **AND** sodium chloride, acetaminophen, docusate sodium, heparin sodium (porcine), mouth rinse, polyethylene glycol, sodium chloride flush   I have personally reviewed following labs and imaging studies  LABORATORY DATA: CBC: Recent Labs  Lab 11/02/22 0004 11/03/22 0840 11/04/22 0227 11/05/22 0330 11/07/22 0948  WBC 9.1 6.1 5.9 5.3 4.5  NEUTROABS  --   --   --   --  2.2  HGB 8.7* 8.1* 8.2* 8.9* 7.8*  HCT 27.2* 25.5* 26.6* 28.8* 25.7*  MCV 85.0 84.2 85.0 87.5 87.7  PLT 141* 169 166 233 247    Basic Metabolic Panel: Recent Labs  Lab 11/01/22 0404 11/02/22 0004 11/03/22 0840 11/04/22 0227 11/05/22 0330 11/07/22 0947  NA 135 135 132* 136 134* 134*  K 3.8 4.1 3.7 3.9 4.2 4.0  CL 100 99 97* 101 98 103  CO2 23 24 28 24 26 22   GLUCOSE 153* 168* 121* 108* 93 139*  BUN 87* 86* 47* 49* 30* 35*  CREATININE 4.32* 4.63* 3.52* 3.82* 3.23* 4.31*  CALCIUM 7.7* 7.8* 7.5* 8.0* 8.3* 8.0*  PHOS 4.6  --   --  5.0* 4.3 4.1    GFR: Estimated Creatinine Clearance: 17.1 mL/min (A) (by C-G formula based on SCr of 4.31 mg/dL (H)).  Liver Function Tests: Recent Labs  Lab 11/01/22 0404 11/03/22 0840 11/04/22 0227 11/05/22 0330 11/07/22 0947  AST  --  36  --   --   --   ALT  --  98*  --   --   --   ALKPHOS  --  81  --   --   --   BILITOT  --  0.9  --   --   --  PROT  --  6.1*  --   --   --    ALBUMIN 2.4* 2.2* 2.4* 2.6* 2.4*   No results for input(s): "LIPASE", "AMYLASE" in the last 168 hours. No results for input(s): "AMMONIA" in the last 168 hours.  Coagulation Profile: No results for input(s): "INR", "PROTIME" in the last 168 hours.  Cardiac Enzymes: No results for input(s): "CKTOTAL", "CKMB", "CKMBINDEX", "TROPONINI" in the last 168 hours.  BNP (last 3 results) No results for input(s): "PROBNP" in the last 8760 hours.  Lipid Profile: No results for input(s): "CHOL", "HDL", "LDLCALC", "TRIG", "CHOLHDL", "LDLDIRECT" in the last 72 hours.  Thyroid Function Tests: No results for input(s): "TSH", "T4TOTAL", "FREET4", "T3FREE", "THYROIDAB" in the last 72 hours.  Anemia Panel: No results for input(s): "VITAMINB12", "FOLATE", "FERRITIN", "TIBC", "IRON", "RETICCTPCT" in the last 72 hours.  Urine analysis:    Component Value Date/Time   COLORURINE YELLOW 10/14/2013 1506   APPEARANCEUR CLEAR 10/14/2013 1506   LABSPEC 1.017 10/14/2013 1506   PHURINE 5.0 10/14/2013 1506   GLUCOSEU NEGATIVE 10/14/2013 1506   HGBUR TRACE (A) 10/14/2013 1506   BILIRUBINUR NEGATIVE 10/14/2013 1506   KETONESUR NEGATIVE 10/14/2013 1506   PROTEINUR 30 (A) 10/14/2013 1506   UROBILINOGEN 0.2 10/14/2013 1506   NITRITE NEGATIVE 10/14/2013 1506   LEUKOCYTESUR NEGATIVE 10/14/2013 1506    Sepsis Labs: Lactic Acid, Venous    Component Value Date/Time   LATICACIDVEN 2.7 (HH) 10/27/2022 0812    MICROBIOLOGY: No results found for this or any previous visit (from the past 240 hour(s)).   RADIOLOGY STUDIES/RESULTS: No results found.   LOS: 13 days   Jeoffrey Massed, MD  [facility]    To contact the attending provider between 7A-7P or the covering provider during after hours 7P-7A, please log into the web site [email or web address] and access using universal [place] password for that web site. If you do not have the password, please call the hospital operator.  11/07/2022, 12:09  PM

## 2022-11-07 NOTE — Plan of Care (Signed)
Problem: Education: Goal: Knowledge of General Education information will improve Description: Including pain rating scale, medication(s)/side effects and non-pharmacologic comfort measures Outcome: Progressing   Problem: Health Behavior/Discharge Planning: Goal: Ability to manage health-related needs will improve Outcome: Progressing   Problem: Clinical Measurements: Goal: Ability to maintain clinical measurements within normal limits will improve Outcome: Progressing Goal: Will remain free from infection Outcome: Progressing Goal: Diagnostic test results will improve Outcome: Progressing Goal: Respiratory complications will improve Outcome: Progressing Goal: Cardiovascular complication will be avoided Outcome: Progressing   Problem: Activity: Goal: Risk for activity intolerance will decrease Outcome: Progressing   Problem: Nutrition: Goal: Adequate nutrition will be maintained Outcome: Progressing   Problem: Coping: Goal: Level of anxiety will decrease Outcome: Progressing   Problem: Elimination: Goal: Will not experience complications related to bowel motility Outcome: Progressing Goal: Will not experience complications related to urinary retention Outcome: Progressing   Problem: Pain Managment: Goal: General experience of comfort will improve Outcome: Progressing   Problem: Safety: Goal: Ability to remain free from injury will improve Outcome: Progressing   Problem: Skin Integrity: Goal: Risk for impaired skin integrity will decrease Outcome: Progressing   Problem: Education: Goal: Ability to describe self-care measures that may prevent or decrease complications (Diabetes Survival Skills Education) will improve Outcome: Progressing Goal: Individualized Educational Video(s) Outcome: Progressing   Problem: Coping: Goal: Ability to adjust to condition or change in health will improve Outcome: Progressing   Problem: Fluid Volume: Goal: Ability to  maintain a balanced intake and output will improve Outcome: Progressing   Problem: Health Behavior/Discharge Planning: Goal: Ability to identify and utilize available resources and services will improve Outcome: Progressing Goal: Ability to manage health-related needs will improve Outcome: Progressing   Problem: Metabolic: Goal: Ability to maintain appropriate glucose levels will improve Outcome: Progressing   Problem: Nutritional: Goal: Maintenance of adequate nutrition will improve Outcome: Progressing Goal: Progress toward achieving an optimal weight will improve Outcome: Progressing   Problem: Skin Integrity: Goal: Risk for impaired skin integrity will decrease Outcome: Progressing   Problem: Tissue Perfusion: Goal: Adequacy of tissue perfusion will improve Outcome: Progressing   Problem: Activity: Goal: Ability to tolerate increased activity will improve Outcome: Progressing   Problem: Respiratory: Goal: Ability to maintain a clear airway and adequate ventilation will improve Outcome: Progressing   Problem: Role Relationship: Goal: Method of communication will improve Outcome: Progressing   Problem: Education: Goal: Knowledge of General Education information will improve Description: Including pain rating scale, medication(s)/side effects and non-pharmacologic comfort measures Outcome: Progressing   Problem: Health Behavior/Discharge Planning: Goal: Ability to manage health-related needs will improve Outcome: Progressing   Problem: Clinical Measurements: Goal: Ability to maintain clinical measurements within normal limits will improve Outcome: Progressing   Problem: Clinical Measurements: Goal: Will remain free from infection Outcome: Progressing   Problem: Clinical Measurements: Goal: Diagnostic test results will improve Outcome: Progressing   Problem: Clinical Measurements: Goal: Respiratory complications will improve Outcome: Progressing    Problem: Clinical Measurements: Goal: Cardiovascular complication will be avoided Outcome: Progressing   Problem: Coping: Goal: Level of anxiety will decrease Outcome: Progressing   Problem: Elimination: Goal: Will not experience complications related to bowel motility Outcome: Progressing   Problem: Elimination: Goal: Will not experience complications related to urinary retention Outcome: Progressing   Problem: Nutrition: Goal: Adequate nutrition will be maintained Outcome: Progressing   Problem: Pain Managment: Goal: General experience of comfort will improve Outcome: Progressing   Problem: Safety: Goal: Ability to remain free from injury will improve Outcome: Progressing  Problem: Skin Integrity: Goal: Risk for impaired skin integrity will decrease Outcome: Progressing   Problem: Coping: Goal: Ability to adjust to condition or change in health will improve Outcome: Progressing   Problem: Fluid Volume: Goal: Ability to maintain a balanced intake and output will improve Outcome: Progressing   Problem: Health Behavior/Discharge Planning: Goal: Ability to identify and utilize available resources and services will improve Outcome: Progressing   Problem: Metabolic: Goal: Ability to maintain appropriate glucose levels will improve Outcome: Progressing   Problem: Nutritional: Goal: Maintenance of adequate nutrition will improve Outcome: Progressing   Problem: Fluid Volume: Goal: Ability to maintain a balanced intake and output will improve Outcome: Progressing

## 2022-11-07 NOTE — Progress Notes (Signed)
[facility] Progress Note   Assessment/ Plan:   Troy Johnston is a/an 72 y.o. male with a past medical history significant for HTN, DM2, CKD, admitted for septic shock and enterobacter bacteremia.         AKI on CKD IV:  likely hemodynamically mediated in the setting of shock.              - started CRRT on 7/25 and stopped on 7/27                          -IV Lasix trial 7/28, did well and Cr plateaued--however Cr has been inching up day by day  - IV Lasix 7/30  - HD 7/31, 8/2  - s/p [facility] 11/04/22  - will need CLIP- sent message to renal navigator  - HD today -  will also call [facility] tomorrow for him for access    2.  Shock: septic with enterobacter bacteremia             - abx per primary-  has completed course             - TTE no obvious vegetation              -No longer requiring pressors    3.  Elevated troponins/ NSTEMI/PEA arrest/Afib w/ RVR             - cardiology following             - anticoagulation per primary--> now getting switched from hep gtt to Eliquis   4.  Acute hypoxic RF:             - now extubated and much improved     5.  Anemia -  on ferrlecit -  will also add ESA   6. Bones-  check PTH-  phos ok no binder so far   7. HTN/volume-  no BP meds at present-  was on 3 as OP-  will resume his losartan   Subjective:    Seen in dialysis -  no c/o's -  understands that dialysis will be long term    Objective:   BP (!) 174/75   Pulse 63   Temp 97.8 F (36.6 C)   Resp 17   Ht 5\' 8"  (1.727 m)   Wt 95.6 kg Comment: Bed Scale  SpO2 100%   BMI 32.05 kg/m   Intake/Output Summary (Last 24 hours) at 11/07/2022 1006 Last data filed at 11/06/2022 2200 Gross per 24 hour  Intake 280 ml  Output 300 ml  Net -20 ml    Weight change:   Physical Exam: Gen:NAD CVS: RRR Resp: muffled at bases Abd: soft Ext: no LE edema ACCESS: R internal jugular tunneled HD cath  Imaging: No results found.  Labs: BMET Recent Labs  Lab 11/01/22 0404  11/02/22 0004 11/03/22 0840 11/04/22 0227 11/05/22 0330 11/07/22 0947  NA 135 135 132* 136 134* 134*  K 3.8 4.1 3.7 3.9 4.2 4.0  CL 100 99 97* 101 98 103  CO2 23 24 28 24 26 22   GLUCOSE 153* 168* 121* 108* 93 139*  BUN 87* 86* 47* 49* 30* 35*  CREATININE 4.32* 4.63* 3.52* 3.82* 3.23* 4.31*  CALCIUM 7.7* 7.8* 7.5* 8.0* 8.3* 8.0*  PHOS 4.6  --   --  5.0* 4.3 4.1   CBC Recent Labs  Lab 11/03/22 0840 11/04/22 0227 11/05/22 0330 11/07/22 0948  WBC  6.1 5.9 5.3 4.5  NEUTROABS  --   --   --  2.2  HGB 8.1* 8.2* 8.9* 7.8*  HCT 25.5* 26.6* 28.8* 25.7*  MCV 84.2 85.0 87.5 87.7  PLT 169 166 233 247    Medications:     apixaban  5 mg Oral BID   Chlorhexidine Gluconate Cloth  6 each Topical Daily   Chlorhexidine Gluconate Cloth  6 each Topical Q0600   docusate sodium  100 mg Oral BID   feeding supplement  237 mL Oral TID BM   insulin aspart  0-15 Units Subcutaneous TID WC   insulin aspart  0-5 Units Subcutaneous QHS   insulin glargine-yfgn  6 Units Subcutaneous Daily   multivitamin  1 tablet Oral QHS   pantoprazole  40 mg Oral QHS   polyethylene glycol  17 g Oral Daily   sodium chloride flush  10-40 mL Intracatheter Q12H    A   11/07/2022, 10:06 AM

## 2022-11-08 DIAGNOSIS — N179 Acute kidney failure, unspecified: Secondary | ICD-10-CM | POA: Diagnosis not present

## 2022-11-08 DIAGNOSIS — N189 Chronic kidney disease, unspecified: Secondary | ICD-10-CM | POA: Diagnosis not present

## 2022-11-08 DIAGNOSIS — R570 Cardiogenic shock: Secondary | ICD-10-CM

## 2022-11-08 DIAGNOSIS — N186 End stage renal disease: Secondary | ICD-10-CM

## 2022-11-08 DIAGNOSIS — Z992 Dependence on renal dialysis: Secondary | ICD-10-CM

## 2022-11-08 LAB — GLUCOSE, CAPILLARY
Glucose-Capillary: 92 mg/dL (ref 70–99)
Glucose-Capillary: 147 mg/dL — ABNORMAL HIGH (ref 70–99)
Glucose-Capillary: 152 mg/dL — ABNORMAL HIGH (ref 70–99)
Glucose-Capillary: 142 mg/dL — ABNORMAL HIGH (ref 70–99)

## 2022-11-08 MED ORDER — CHLORHEXIDINE GLUCONATE CLOTH 2 % EX PADS: 6.0000 | MEDICATED_PAD | Freq: Every day | CUTANEOUS | Status: DC

## 2022-11-08 NOTE — Progress Notes (Addendum)
Contacted local clinic that is reviewing pt's case to inquire about status of pt's referral. Clinic to return call to navigator as soon as they can as to if pt can be accepted and what pt's schedule will be. Update provided to attending and will provide update to team when more details are available. Will assist as needed.   Olivia Canter Renal Navigator [phone]  Addendum at 1:26 pm: Case discussed with nephrologist. Pt is now considered ESRD. Contacted [facility] admissions and clinic manager at local clinic to provide an update in regards to change in pt's diagnosis. Pt has been accepted at [facility] [place] [facility]. [facility] to [facility] information but had to leave a message with this info and requested a return call. Will need to verify that [facility] is willing [facility] at [facility] [place] with ESRD diagnosis. Awaiting a return call from [place] HD CSW. Update provided to attending and nephrologist. Will discuss all arrangements with pt once [facility] confirms being agreeable to plan.   Addendum at 3:27 pm: Received a call from [place] HD CSW Delice Bison) to say that since pt is now considered ESRD then pt will need to go to [place] in order for the [place] to cover out-pt HD. If pt wants to receive HD in [place], then pt will need to use his insurance benefits for out-pt HD services. Met with pt at bedside to discuss the above. Pt prefers to receive out-pt HD in [place] and it be billed to his insurance. [facility]ed [facility] to make her aware that pt prefers to receive HD in [place] and it be billed to pt's insurance. Contacted [facility] admissions to make them aware that pt wants to receive HD in [place] and it be billed to pt's insurance. Awaiting response from [facility] admissions regarding how to proceed from here regarding change in payer source for out-pt HD.

## 2022-11-08 NOTE — Progress Notes (Signed)
[facility] Progress Note   Assessment/ Plan:   Troy Johnston is a/an 72 y.o. male with a past medical history significant for HTN, DM2, CKD, admitted for septic shock and enterobacter bacteremia.         AKI on CKD IV:  likely hemodynamically mediated in the setting of shock-  crt was 1.9 but that was 2.5 years before this presentation with crt of 8             - started CRRT on 7/25 and stopped on 7/27                          - Cr had been inching up day by day-  suspect he is at ESRD     -  resumed  HD 7/31, 8/2  - s/p [facility] 11/04/22  - will need CLIP- sent message to renal navigator-  is pending  - HD on MWF schedule here -   called [facility] for access -  next HD tomorrow via [facility]   2.  Shock: septic with enterobacter bacteremia             - abx per primary-  has completed course             - TTE no obvious vegetation              -No longer requiring pressors    3.  Elevated troponins/ NSTEMI/PEA arrest/Afib w/ RVR             - cardiology following             - anticoagulation per primary--> now getting switched from hep gtt to Eliquis   4.  Acute hypoxic RF:             - now extubated and much improved     5.  Anemia -  on ferrlecit -  also added ESA   6. Bones-  check PTH (pending) -  phos ok no binder so far   7. HTN/volume-  no BP meds at present-  was on 3 as OP-  have resumed his losartan - no change today   Subjective:    S/p HD yesterday-  tolerated well- removed 1000   Objective:   BP (!) 146/59 (BP Location: Left Arm)   Pulse 67   Temp 98.3 F (36.8 C) (Oral)   Resp 19   Ht 5\' 8"  (1.727 m)   Wt 94.1 kg   SpO2 97%   BMI 31.54 kg/m   Intake/Output Summary (Last 24 hours) at 11/08/2022 1010 Last data filed at 11/08/2022 0850 Gross per 24 hour  Intake 10 ml  Output 1000 ml  Net -990 ml    Weight change: -2.6 kg  Physical Exam: Gen:NAD CVS: RRR Resp: muffled at bases Abd: soft Ext: no LE edema ACCESS: R internal jugular tunneled  HD cath  Imaging: No results found.  Labs: BMET Recent Labs  Lab 11/02/22 0004 11/03/22 0840 11/04/22 0227 11/05/22 0330 11/07/22 0947  NA 135 132* 136 134* 134*  K 4.1 3.7 3.9 4.2 4.0  CL 99 97* 101 98 103  CO2 24 28 24 26 22   GLUCOSE 168* 121* 108* 93 139*  BUN 86* 47* 49* 30* 35*  CREATININE 4.63* 3.52* 3.82* 3.23* 4.31*  CALCIUM 7.8* 7.5* 8.0* 8.3* 8.0*  PHOS  --   --  5.0* 4.3 4.1   CBC Recent Labs  Lab 11/03/22 0840 11/04/22 0227 11/05/22 0330 11/07/22 0948  WBC 6.1 5.9 5.3 4.5  NEUTROABS  --   --   --  2.2  HGB 8.1* 8.2* 8.9* 7.8*  HCT 25.5* 26.6* 28.8* 25.7*  MCV 84.2 85.0 87.5 87.7  PLT 169 166 233 247    Medications:     apixaban  5 mg Oral BID   Chlorhexidine Gluconate Cloth  6 each Topical Daily   Chlorhexidine Gluconate Cloth  6 each Topical Q0600   darbepoetin (ARANESP) injection - DIALYSIS  150 mcg Subcutaneous Q Mon-1800   docusate sodium  100 mg Oral BID   feeding supplement  237 mL Oral TID BM   insulin aspart  0-15 Units Subcutaneous TID WC   insulin aspart  0-5 Units Subcutaneous QHS   insulin glargine-yfgn  6 Units Subcutaneous Daily   losartan  50 mg Oral QHS   multivitamin  1 tablet Oral QHS   pantoprazole  40 mg Oral QHS   polyethylene glycol  17 g Oral Daily   sodium chloride flush  10-40 mL Intracatheter Q12H    A   11/08/2022, 10:10 AM

## 2022-11-08 NOTE — Plan of Care (Signed)
  Problem: Education: Goal: Knowledge of General Education information will improve Description: Including pain rating scale, medication(s)/side effects and non-pharmacologic comfort measures Outcome: Progressing   Problem: Health Behavior/Discharge Planning: Goal: Ability to manage health-related needs will improve Outcome: Progressing   Problem: Clinical Measurements: Goal: Ability to maintain clinical measurements within normal limits will improve Outcome: Progressing Goal: Will remain free from infection Outcome: Progressing Goal: Diagnostic test results will improve Outcome: Progressing Goal: Respiratory complications will improve Outcome: Progressing Goal: Cardiovascular complication will be avoided Outcome: Progressing   Problem: Activity: Goal: Risk for activity intolerance will decrease Outcome: Progressing   Problem: Nutrition: Goal: Adequate nutrition will be maintained Outcome: Progressing   Problem: Coping: Goal: Level of anxiety will decrease Outcome: Progressing   Problem: Elimination: Goal: Will not experience complications related to bowel motility Outcome: Progressing Goal: Will not experience complications related to urinary retention Outcome: Progressing   Problem: Pain Managment: Goal: General experience of comfort will improve Outcome: Progressing   Problem: Safety: Goal: Ability to remain free from injury will improve Outcome: Progressing   Problem: Skin Integrity: Goal: Risk for impaired skin integrity will decrease Outcome: Progressing   Problem: Education: Goal: Ability to describe self-care measures that may prevent or decrease complications (Diabetes Survival Skills Education) will improve Outcome: Progressing Goal: Individualized Educational Video(s) Outcome: Progressing   Problem: Coping: Goal: Ability to adjust to condition or change in health will improve Outcome: Progressing   Problem: Fluid Volume: Goal: Ability to  maintain a balanced intake and output will improve Outcome: Progressing   Problem: Health Behavior/Discharge Planning: Goal: Ability to identify and utilize available resources and services will improve Outcome: Progressing Goal: Ability to manage health-related needs will improve Outcome: Progressing   Problem: Metabolic: Goal: Ability to maintain appropriate glucose levels will improve Outcome: Progressing   Problem: Nutritional: Goal: Maintenance of adequate nutrition will improve Outcome: Progressing Goal: Progress toward achieving an optimal weight will improve Outcome: Progressing   Problem: Skin Integrity: Goal: Risk for impaired skin integrity will decrease Outcome: Progressing   Problem: Tissue Perfusion: Goal: Adequacy of tissue perfusion will improve Outcome: Progressing   Problem: Activity: Goal: Ability to tolerate increased activity will improve Outcome: Progressing   Problem: Respiratory: Goal: Ability to maintain a clear airway and adequate ventilation will improve Outcome: Progressing   Problem: Role Relationship: Goal: Method of communication will improve Outcome: Progressing   

## 2022-11-08 NOTE — Progress Notes (Signed)
Physical Therapy Treatment Patient Details Name: Troy Johnston MRN: [id number] DOB: [birth year] Today's Date: 11/08/2022   History of Present Illness 72 yo male admitted 7/23 with SOB and shock. 7/24 PEA arrest with ROSC after 10 min ACLS, NSTEMI. Intubated 7/24-7/25. 7/25 - 7/27 CRRT. PMhx: T2DM, CKD, HTN, prostate CA    PT Comments  Pt progressing well towards his physical therapy goals, exhibiting improved activity tolerance and balance. Pt ambulating 300 ft with no assistive device or physical assist. Due to continued mild dynamic imbalance, continued to recommend cane for negotiating unlevel surfaces; pt verbalized understanding and stated he has multiple in various locations around house and car. Will continue to follow acutely.    If plan is discharge home, recommend the following: A little help with bathing/dressing/bathroom;Assistance with cooking/housework;Assist for transportation   Can travel by private vehicle        Equipment Recommendations  None recommended by PT    Recommendations for Other Services       Precautions / Restrictions Precautions Precautions: Fall Restrictions Weight Bearing Restrictions: No     Mobility  Bed Mobility               General bed mobility comments: OOB in chair    Transfers Overall transfer level: Modified independent Equipment used: None                    Ambulation/Gait Ambulation/Gait assistance: Min guard Gait Distance (Feet): 300 Feet Assistive device: None Gait Pattern/deviations: Step-through pattern, Decreased stride length, Wide base of support, Decreased stance time - left       General Gait Details: One episode of minor lateral LOB, which pt was able to correct without physical assist.   Stairs             Wheelchair Mobility     Tilt Bed    Modified Rankin (Stroke Patients Only)       Balance Overall balance assessment: Mild deficits observed, not formally tested                                           Cognition Arousal/Alertness: Awake/alert Behavior During Therapy: WFL for tasks assessed/performed Overall Cognitive Status: Within Functional Limits for tasks assessed                                          Exercises      General Comments        Pertinent Vitals/Pain Pain Assessment Pain Assessment: Faces Faces Pain Scale: No hurt    Home Living                          Prior Function            PT Goals (current goals can now be found in the care plan section) Acute Rehab PT Goals Patient Stated Goal: return home Potential to Achieve Goals: Good Progress towards PT goals: Progressing toward goals    Frequency    Min 1X/week      PT Plan Current plan remains appropriate    Co-evaluation              AM-PAC PT "6 Clicks" Mobility   Outcome Measure  Help needed turning from  your back to your side while in a flat bed without using bedrails?: None Help needed moving from lying on your back to sitting on the side of a flat bed without using bedrails?: None Help needed moving to and from a bed to a chair (including a wheelchair)?: None Help needed standing up from a chair using your arms (e.g., wheelchair or bedside chair)?: None Help needed to walk in hospital room?: A Little Help needed climbing 3-5 steps with a railing? : A Little 6 Click Score: 22    End of Session   Activity Tolerance: Patient tolerated treatment well Patient left: in chair;with call bell/phone within reach Nurse Communication: Mobility status PT Visit Diagnosis: Other abnormalities of gait and mobility (R26.89);Muscle weakness (generalized) (M62.81);Difficulty in walking, not elsewhere classified (R26.2)     Time: 9604-5409 PT Time Calculation (min) (ACUTE ONLY): 14 min  Charges:    $Therapeutic Activity: 8-22 mins PT General Charges $$ ACUTE PT VISIT: 1 Visit                     Lillia Pauls, PT, DPT Acute Rehabilitation Services Office [phone]    Norval Morton 11/08/2022, 2:49 PM

## 2022-11-08 NOTE — Consult Note (Addendum)
Hospital Consult   Reason for Consult:  dialysis access Requesting Physician:  Dr. Kathrene Bongo MRN #:  [id number]  History of Present Illness: This is a 72 y.o. male with past medical history significant for diabetes mellitus, hypertension, CKD.  He was admitted with septic and cardiogenic shock complicated by PEA arrest with ROSC after 10 minutes.  He has progressed to end-stage renal disease on hemodialysis.  Right IJ TDC has been placed by interventional radiology.  He is being seen in consultation for evaluation for permanent dialysis access.  He is right arm dominant and would prefer access placement in his left arm.  He does not have a pacemaker.  He has already been transition from IV heparin to Eliquis due to atrial fibrillation.  He has not had any vein mapping.  Past Medical History:  Diagnosis Date   Diabetes mellitus without complication (HCC)    GERD (gastroesophageal reflux disease)    Hypertension    Prostate cancer (HCC)     Past Surgical History:  Procedure Laterality Date   IR FLUORO GUIDE CV LINE RIGHT  11/04/2022   IR US GUIDE VASC ACCESS RIGHT  11/04/2022    Allergies  Allergen Reactions   Ferrous Sulfate Other (See Comments)   Penicillins Hives and Other (See Comments)    Has patient had a PCN reaction causing immediate rash, facial/tongue/throat swelling, SOB or lightheadedness with hypotension: no Has patient had a PCN reaction causing severe rash involving mucus membranes or skin necrosis: no Has patient had a PCN reaction that required hospitalization: yes Has patient had a PCN reaction occurring within the last 10 years: no If all of the above answers are "NO", then may proceed with Cephalosporin use.    Prior to Admission medications   Medication Sig Start Date End Date Taking? Authorizing Provider  allopurinol (ZYLOPRIM) 100 MG tablet Take 50 mg by mouth every other day. For high uric acid level   Yes [provider]  Alogliptin Benzoate 12.5  MG TABS Take 6.25 mg by mouth every morning. For diabetes (replaces metformin)   Yes [provider]  atorvastatin (LIPITOR) 20 MG tablet Take 20 mg by mouth at bedtime. For cholesterol   Yes [provider]  carvedilol (COREG) 25 MG tablet Take 12.5 mg by mouth 2 (two) times daily with a meal. 07/08/22  Yes [provider]  glipiZIDE (GLUCOTROL) 10 MG tablet Take 5 mg by mouth 2 (two) times daily before a meal. For diabetes   Yes [provider]  hydrALAZINE (APRESOLINE) 100 MG tablet Take 100 mg by mouth every 8 (eight) hours.   Yes [provider]  losartan (COZAAR) 100 MG tablet Take 100 mg by mouth daily.   Yes [provider]    Social History   Socioeconomic History   Marital status: Single    Spouse name: Not on file   Number of children: Not on file   Years of education: Not on file   Highest education level: Not on file  Occupational History   Not on file  Tobacco Use   Smoking status: Never    Passive exposure: Never   Smokeless tobacco: Never  Vaping Use   Vaping status: Never Used  Substance and Sexual Activity   Alcohol use: No   Drug use: No   Sexual activity: Not on file  Other Topics Concern   Not on file  Social History Narrative   Not on file   Social Determinants of Health  Financial Resource Strain: Low Risk  (10/27/2022)   Overall Financial Resource Strain (CARDIA)    Difficulty of Paying Living Expenses: Not hard at all  Food Insecurity: No Food Insecurity (10/27/2022)   Hunger Vital Sign    Worried About Running Out of Food in the Last Year: Never true    Ran Out of Food in the Last Year: Never true  Transportation Needs: No Transportation Needs (10/27/2022)   PRAPARE - Administrator, Civil Service (Medical): No    Lack of Transportation (Non-Medical): No  Physical Activity: Not on file  Stress: Not on file  Social Connections: Not on file  Intimate Partner Violence: Not At Risk  (10/27/2022)   Humiliation, Afraid, Rape, and Kick questionnaire    Fear of Current or Ex-Partner: No    Emotionally Abused: No    Physically Abused: No    Sexually Abused: No     No family history on file.  ROS: Otherwise negative unless mentioned in HPI  Physical Examination  Vitals:   11/07/22 1314 11/07/22 1614  BP: (!) 140/83 (!) 146/59  Pulse: 68 67  Resp: 18 19  Temp: 97.8 F (36.6 C) 98.3 F (36.8 C)  SpO2: 96% 97%   Body mass index is 31.54 kg/m.  General:  WDWN in NAD Gait: Not observed HENT: WNL, normocephalic Pulmonary: normal non-labored breathing, without Rales, rhonchi,  wheezing Cardiac: regular, without  Murmurs, rubs or gallops; without carotid bruits Abdomen:  soft, NT/ND, no masses Skin: without rashes Vascular Exam/Pulses: L radial 2+; R radial 1+ Extremities: without ischemic changes, without Gangrene , without cellulitis; without open wounds;  Musculoskeletal: no muscle wasting or atrophy  Neurologic: A&O X 3;  No focal weakness or paresthesias are detected; speech is fluent/normal Psychiatric:  The pt has Normal affect. Lymph:  Unremarkable  CBC    Component Value Date/Time   WBC 4.5 11/07/2022 0948   RBC 2.93 (L) 11/07/2022 0948   HGB 7.8 (L) 11/07/2022 0948   HCT 25.7 (L) 11/07/2022 0948   PLT 247 11/07/2022 0948   MCV 87.7 11/07/2022 0948   MCH 26.6 11/07/2022 0948   MCHC 30.4 11/07/2022 0948   RDW 14.3 11/07/2022 0948   LYMPHSABS 1.5 11/07/2022 0948   MONOABS 0.6 11/07/2022 0948   EOSABS 0.1 11/07/2022 0948   BASOSABS 0.1 11/07/2022 0948    BMET    Component Value Date/Time   NA 134 (L) 11/07/2022 0947   K 4.0 11/07/2022 0947   CL 103 11/07/2022 0947   CO2 22 11/07/2022 0947   GLUCOSE 139 (H) 11/07/2022 0947   BUN 35 (H) 11/07/2022 0947   CREATININE 4.31 (H) 11/07/2022 0947   CALCIUM 8.0 (L) 11/07/2022 0947   GFRNONAA 14 (L) 11/07/2022 0947   GFRAA 30 (L) 11/14/2019 0445    COAGS: No results found for: "INR",  "PROTIME"   Non-Invasive Vascular Imaging:   Vein map pending   ASSESSMENT/PLAN: This is a 72 y.o. male with end-stage renal disease on hemodialysis being seen in consultation for evaluation for permanent dialysis access  -Mr. Troy Johnston is a 72 year old male admitted with septic and cardiogenic shock complicated by PEA arrest with ROSC after 10 minutes.  He has been recovering however has progressed to end-stage renal disease on hemodialysis.  He has already had a right IJ TDC placed by interventional radiology.  He is right arm dominant and would prefer access placement in his left arm.  Vein mapping has been ordered for bilateral upper extremities.  Ok to continue L forearm IV for now.  Given his significant cardiac event, he may be best served with outpatient dialysis access placement however will discuss this with on-call vascular surgeon Dr. Edilia Bo.  It should also be noted the patient has already been transition from IV heparin to Eliquis for atrial fibrillation.   Emilie Rutter PA-C [facility] [phone]  \I have interviewed the patient and examined the patient. I agree with the findings by the PA.  Vein mapping pending.  I will make further recommendations pending these results.  Given multifactorial septic and cardiogenic shock with Enterobacter cloaca bacteremia I would agree that it may be best to let him fully recover and arrange access as an outpatient.  He is currently on Eliquis.  Cari Caraway, MD

## 2022-11-08 NOTE — Progress Notes (Signed)
New Dialysis Start    Patient identified as new dialysis start. Kidney Education packet assembled and given. Discussed the following items with patient:     Current medications and possible changes once started:  Discussed that patient's medications may change over time.  Ex; hypertension medications and diabetes medication.  Nephrologists will adjust as needed.   Fluid restrictions reviewed:  32 oz daily goal:  All liquids count; soups, ice, jello, fruits. Will also refer dietitian.   Phosphorus and potassium: Handout given showing high potassium and phosphorus foods.  Alternative food and drink options given. Will also refer dietitian.   Family support:  Son at bedside, very supportive.   Outpatient Clinic Resources:  Discussed roles of Outpatient clinic staff and advised to make a list of needs, if any, to talk with outpatient staff if needed.   Care plan schedule: Informed patient of Care Plans in outpatient setting and to participate in the care plan.  An invitation would be given from outpatient clinic.    Dialysis Access Options:  Reviewed access options with patients. Discussed in detail about care at home with new AVG & AVF. Reviewed checking bruit and thrill. If dialysis catheter present, educated that patient could not take showers.  Catheter dressing changes were to be done by outpatient clinic staff only   Home therapy options:  Educated patient about home therapy options:  PD vs home hemo.     Patient verbalized understanding, very inquisitive. Hopeful that he will be able to follow the diet. Son is curious about treatment lengths and days. Both were very grateful of the visit and conversation. Will continue to round on patient during admission.    Jean Rosenthal Dialysis Nurse Coordinator [phone]

## 2022-11-08 NOTE — Progress Notes (Signed)
PROGRESS NOTE        PATIENT DETAILS Name: Troy Johnston Age: 72 y.o. Sex: male Date of Birth: 06/18/1950 Admit Date: 10/25/2022 Admitting Physician Oretha Milch, MD ERD:[id number], Lenn Sink  Brief Summary: Patient is a 72 y.o.  male with history of CKD 4, HTN, DM-2 who presented with dyspnea/altered mental status-found to have multifactorial shock (septic and cardiogenic), AKI-Hospital course complicated by PEA arrest-with ROSC after 10 minutes.  Stabilized in the ICU-evaluated by advanced heart failure team-and nephrology-subsequently transferred to [facility] on 7/30.  See below for further details.  Significant events: 07/23>>: Admission to ICU 07/24>> AF-RVR >> amio gtt ,Placement of HD cath, PEA arrest ,intubated ,ROSC after 10 minutes , EF low at 20% 07/25>> Patient extubated, started CRRT , hypotensive and given amiodarone again 07/26>> weaned off Levophed 07/27>> CRRT stopped 07/30>> transferred to [facility] 07/31>> worsening creatinine-underwent HD.  Significant studies: 7/23>> CXR: Possible retrocardiac opacities 7/23>> renal ultrasound: Mild right-sided hydronephrosis 7/24>> echo: EF 20-25%, RV systolic function severely reduced. 7/29>> repeat limited echo: EF 40-45%, RV systolic function normal  Significant microbiology data: 7/23>> COVID/influenza/RSV PCR: Negative 7/23>> blood culture: Enterobacter cloacae 7/24>> stool C. difficile: Negative 7/24>> stool GI pathogen panel: Norovirus  Procedures: 7/24-7/25>> ETT 8/02>> tunneled dialysis catheter by IR  Consults: Cardiology Nephrology PCCM Vascular surgery  Subjective: No complaints-lying comfortably in bed-awaiting outpatient HD arrangement.  Objective: Vitals: Blood pressure (!) 147/57, pulse 66, temperature 98.9 F (37.2 C), resp. rate 18, height 5\' 8"  (1.727 m), weight 94.1 kg, SpO2 97%.   Exam: Gen Exam:Alert awake-not in any distress HEENT:atraumatic, normocephalic Chest:  B/L clear to auscultation anteriorly CVS:S1S2 regular Abdomen:soft non tender, non distended Extremities:no edema Neurology: Non focal Skin: no rash  Pertinent Labs/Radiology:    Latest Ref Rng & Units 11/07/2022    9:48 AM 11/05/2022    3:30 AM 11/04/2022    2:27 AM  CBC  WBC 4.0 - 10.5 K/uL 4.5  5.3  5.9   Hemoglobin 13.0 - 17.0 g/dL 7.8  8.9  8.2   Hematocrit 39.0 - 52.0 % 25.7  28.8  26.6   Platelets 150 - 400 K/uL 247  233  166     Lab Results  Component Value Date   NA 134 (L) 11/07/2022   K 4.0 11/07/2022   CL 103 11/07/2022   CO2 22 11/07/2022      Assessment/Plan: Multifactorial shock-septic and cardiogenic shock Enterobacter cloacae bacteremia Norovirus infection Shock physiology significantly better-BP stable Completed course of antibiotics. Continue to monitor off antibiotics  Acute biventricular systolic heart failure Volume status improved with dialysis. Repeat echo on 7/29 with improvement in LVEF and RV systolic function Cardiology had contemplated starting GDMT medications-but since patient has been started on intermittent hemodialysis-and at risk for blood pressure drops-cardiology holding off on starting GDMT meds.   PEA cardiac arrest V-fib s/p defibrillation Paroxysmal atrial fibrillation Cardiac arrest felt to be in the setting of septic shock Did not tolerate amiodarone-thankfully maintaining sinus rhythm Was on IV heparin-since no more procedures planned-stop IV heparin-transition to Eliquis.  AKI on CKD 4-now likely ESRD  AKI hemodynamically mediated-in the setting of hypotension/shock Per nephrology-now likely ESRD Outpatient arrangements being made [facility] placed 8/2 Vascular surgery planning permanent access as an outpatient Awaiting outpatient HD chair.   Acute hypoxic respiratory failure Intubated in the setting of cardiac arrest-extubated on  7/25-currently doing well on room air.  Shock liver Secondary to septic shock-significant  improvement in liver enzymes. Follow liver enzymes periodically  DM-2 (A1c 7.5 on 7/24) CBG stable but on the lower side-decrease Semglee to 6 units daily-remains on SSI.    Recent Labs    11/07/22 2058 11/08/22 0838 11/08/22 1229  GLUCAP 169* 92 147*     HLD Statin on hold due to elevated LFTs  HTN BP stable but slowly creeping up-reassess on 8/4-Coreg/hydralazine/losartan on hold for now.  Nutrition Status: Nutrition Problem: Increased nutrient needs Etiology: acute illness (AKI on CRRT) Signs/Symptoms: estimated needs Interventions: Ensure Enlive (each supplement provides 350kcal and 20 grams of protein), MVI, Liberalize Diet  Obesity: Estimated body mass index is 31.54 kg/m as calculated from the following:   Height as of this encounter: 5\' 8"  (1.727 m).   Weight as of this encounter: 94.1 kg.   Code status:   Code Status: DNR   DVT Prophylaxis: SCDs Start: 10/25/22 1759 IV Heparin apixaban (ELIQUIS) tablet 5 mg    Family Communication: Nephew-[phone] updated at bedside   Disposition Plan: Status is: Inpatient Remains inpatient appropriate because: Severity of illness   Planned Discharge Destination:Home health once outpatient HD chair arrangements have been made.  Hopefully in the next 1-2 days.   Diet: Diet Order             Diet regular Room service appropriate? Yes; Fluid consistency: Thin; Fluid restriction: 1200 mL Fluid  Diet effective now                     Antimicrobial agents: Anti-infectives (From admission, onward)    Start     Dose/Rate Route Frequency Ordered Stop   11/04/22 0819  vancomycin (VANCOCIN) IVPB 1000 mg/200 mL premix        over 60 Minutes Intravenous Continuous PRN 11/04/22 0819 11/04/22 0819   10/31/22 0600  meropenem (MERREM) 1 g in sodium chloride 0.9 % 100 mL IVPB        1 g 200 mL/hr over 30 Minutes Intravenous Every 24 hours 10/30/22 0835 11/01/22 0645   10/27/22 1400  meropenem (MERREM) 1 g in sodium  chloride 0.9 % 100 mL IVPB  Status:  Discontinued        1 g 200 mL/hr over 30 Minutes Intravenous Every 8 hours 10/27/22 1122 10/30/22 0835   10/26/22 1000  meropenem (MERREM) 1 g in sodium chloride 0.9 % 100 mL IVPB  Status:  Discontinued        1 g 200 mL/hr over 30 Minutes Intravenous Daily 10/26/22 0825 10/27/22 1122   10/26/22 0500  aztreonam (AZACTAM) 2 g in sodium chloride 0.9 % 100 mL IVPB  Status:  Discontinued        2 g 200 mL/hr over 30 Minutes Intravenous Every 12 hours 10/26/22 0325 10/26/22 0825   10/26/22 0415  vancomycin (VANCOREADY) IVPB 1500 mg/300 mL  Status:  Discontinued        1,500 mg 150 mL/hr over 120 Minutes Intravenous Every 24 hours 10/26/22 0325 10/26/22 0359   10/26/22 0415  metroNIDAZOLE (FLAGYL) IVPB 500 mg  Status:  Discontinued        500 mg 100 mL/hr over 60 Minutes Intravenous Every 8 hours 10/26/22 0325 10/26/22 0829   10/26/22 0357  vancomycin variable dose per unstable renal function (pharmacist dosing)  Status:  Discontinued         Does not apply See admin instructions 10/26/22 0358 10/26/22 1027  10/25/22 1645  vancomycin (VANCOREADY) IVPB 2000 mg/400 mL        2,000 mg 200 mL/hr over 120 Minutes Intravenous  Once 10/25/22 1632 10/25/22 2132   10/25/22 1615  aztreonam (AZACTAM) 2 g in sodium chloride 0.9 % 100 mL IVPB        2 g 200 mL/hr over 30 Minutes Intravenous  Once 10/25/22 1607 10/25/22 1924   10/25/22 1615  metroNIDAZOLE (FLAGYL) IVPB 500 mg        500 mg 100 mL/hr over 60 Minutes Intravenous  Once 10/25/22 1607 10/25/22 1728   10/25/22 1615  vancomycin (VANCOCIN) IVPB 1000 mg/200 mL premix  Status:  Discontinued        1,000 mg 200 mL/hr over 60 Minutes Intravenous  Once 10/25/22 1607 10/25/22 1632        MEDICATIONS: Scheduled Meds:  apixaban  5 mg Oral BID   Chlorhexidine Gluconate Cloth  6 each Topical Daily   Chlorhexidine Gluconate Cloth  6 each Topical Q0600   [START ON 11/09/2022] Chlorhexidine Gluconate Cloth  6 each  Topical Q0600   darbepoetin (ARANESP) injection - DIALYSIS  150 mcg Subcutaneous Q Mon-1800   docusate sodium  100 mg Oral BID   feeding supplement  237 mL Oral TID BM   insulin aspart  0-15 Units Subcutaneous TID WC   insulin aspart  0-5 Units Subcutaneous QHS   insulin glargine-yfgn  6 Units Subcutaneous Daily   losartan  50 mg Oral QHS   multivitamin  1 tablet Oral QHS   pantoprazole  40 mg Oral QHS   polyethylene glycol  17 g Oral Daily   sodium chloride flush  10-40 mL Intracatheter Q12H   Continuous Infusions:  sodium chloride Stopped (10/30/22 1501)   sodium chloride     PRN Meds:.Place/Maintain arterial line **AND** sodium chloride, acetaminophen, docusate sodium, mouth rinse, polyethylene glycol, sodium chloride flush   I have personally reviewed following labs and imaging studies  LABORATORY DATA: CBC: Recent Labs  Lab 11/02/22 0004 11/03/22 0840 11/04/22 0227 11/05/22 0330 11/07/22 0948  WBC 9.1 6.1 5.9 5.3 4.5  NEUTROABS  --   --   --   --  2.2  HGB 8.7* 8.1* 8.2* 8.9* 7.8*  HCT 27.2* 25.5* 26.6* 28.8* 25.7*  MCV 85.0 84.2 85.0 87.5 87.7  PLT 141* 169 166 233 247    Basic Metabolic Panel: Recent Labs  Lab 11/02/22 0004 11/03/22 0840 11/04/22 0227 11/05/22 0330 11/07/22 0947  NA 135 132* 136 134* 134*  K 4.1 3.7 3.9 4.2 4.0  CL 99 97* 101 98 103  CO2 24 28 24 26 22   GLUCOSE 168* 121* 108* 93 139*  BUN 86* 47* 49* 30* 35*  CREATININE 4.63* 3.52* 3.82* 3.23* 4.31*  CALCIUM 7.8* 7.5* 8.0* 8.3* 8.0*  PHOS  --   --  5.0* 4.3 4.1    GFR: Estimated Creatinine Clearance: 17 mL/min (A) (by C-G formula based on SCr of 4.31 mg/dL (H)).  Liver Function Tests: Recent Labs  Lab 11/03/22 0840 11/04/22 0227 11/05/22 0330 11/07/22 0947  AST 36  --   --   --   ALT 98*  --   --   --   ALKPHOS 81  --   --   --   BILITOT 0.9  --   --   --   PROT 6.1*  --   --   --   ALBUMIN 2.2* 2.4* 2.6* 2.4*   No results for input(s): "LIPASE", "  AMYLASE" in the last  168 hours. No results for input(s): "AMMONIA" in the last 168 hours.  Coagulation Profile: No results for input(s): "INR", "PROTIME" in the last 168 hours.  Cardiac Enzymes: No results for input(s): "CKTOTAL", "CKMB", "CKMBINDEX", "TROPONINI" in the last 168 hours.  BNP (last 3 results) No results for input(s): "PROBNP" in the last 8760 hours.  Lipid Profile: No results for input(s): "CHOL", "HDL", "LDLCALC", "TRIG", "CHOLHDL", "LDLDIRECT" in the last 72 hours.  Thyroid Function Tests: No results for input(s): "TSH", "T4TOTAL", "FREET4", "T3FREE", "THYROIDAB" in the last 72 hours.  Anemia Panel: No results for input(s): "VITAMINB12", "FOLATE", "FERRITIN", "TIBC", "IRON", "RETICCTPCT" in the last 72 hours.  Urine analysis:    Component Value Date/Time   COLORURINE YELLOW 10/14/2013 1506   APPEARANCEUR CLEAR 10/14/2013 1506   LABSPEC 1.017 10/14/2013 1506   PHURINE 5.0 10/14/2013 1506   GLUCOSEU NEGATIVE 10/14/2013 1506   HGBUR TRACE (A) 10/14/2013 1506   BILIRUBINUR NEGATIVE 10/14/2013 1506   KETONESUR NEGATIVE 10/14/2013 1506   PROTEINUR 30 (A) 10/14/2013 1506   UROBILINOGEN 0.2 10/14/2013 1506   NITRITE NEGATIVE 10/14/2013 1506   LEUKOCYTESUR NEGATIVE 10/14/2013 1506    Sepsis Labs: Lactic Acid, Venous    Component Value Date/Time   LATICACIDVEN 2.7 (HH) 10/27/2022 0812    MICROBIOLOGY: No results found for this or any previous visit (from the past 240 hour(s)).   RADIOLOGY STUDIES/RESULTS: No results found.   LOS: 14 days   Jeoffrey Massed, MD  [facility]    To contact the attending provider between 7A-7P or the covering provider during after hours 7P-7A, please log into the web site [email or web address] and access using universal [place] password for that web site. If you do not have the password, please call the hospital operator.  11/08/2022, 2:15 PM

## 2022-11-09 ENCOUNTER — Inpatient Hospital Stay (HOSPITAL_COMMUNITY): Payer: No Typology Code available for payment source

## 2022-11-09 ENCOUNTER — Other Ambulatory Visit (HOSPITAL_COMMUNITY): Payer: Self-pay

## 2022-11-09 DIAGNOSIS — J9601 Acute respiratory failure with hypoxia: Secondary | ICD-10-CM | POA: Diagnosis not present

## 2022-11-09 DIAGNOSIS — N186 End stage renal disease: Secondary | ICD-10-CM | POA: Diagnosis not present

## 2022-11-09 DIAGNOSIS — N179 Acute kidney failure, unspecified: Secondary | ICD-10-CM | POA: Diagnosis not present

## 2022-11-09 DIAGNOSIS — I469 Cardiac arrest, cause unspecified: Secondary | ICD-10-CM | POA: Diagnosis not present

## 2022-11-09 DIAGNOSIS — E872 Acidosis, unspecified: Secondary | ICD-10-CM | POA: Diagnosis not present

## 2022-11-09 LAB — RENAL FUNCTION PANEL
Albumin: 2.4 g/dL — ABNORMAL LOW (ref 3.5–5.0)
Anion gap: 11 (ref 5–15)
BUN: 37 mg/dL — ABNORMAL HIGH (ref 8–23)
CO2: 22 mmol/L (ref 22–32)
Calcium: 8.4 mg/dL — ABNORMAL LOW (ref 8.9–10.3)
Chloride: 102 mmol/L (ref 98–111)
Creatinine, Ser: 4.39 mg/dL — ABNORMAL HIGH (ref 0.61–1.24)
GFR, Estimated: 13 mL/min — ABNORMAL LOW (ref >60)
Glucose, Bld: 99 mg/dL (ref 70–99)
Phosphorus: 4 mg/dL (ref 2.5–4.6)
Potassium: 3.9 mmol/L (ref 3.5–5.1)
Sodium: 135 mmol/L (ref 135–145)

## 2022-11-09 LAB — CBC
HCT: 25.8 % — ABNORMAL LOW (ref 39.0–52.0)
Hemoglobin: 7.9 g/dL — ABNORMAL LOW (ref 13.0–17.0)
MCH: 26.4 pg (ref 26.0–34.0)
MCHC: 30.6 g/dL (ref 30.0–36.0)
MCV: 86.3 fL (ref 80.0–100.0)
Platelets: 244 10*3/uL (ref 150–400)
RBC: 2.99 MIL/uL — ABNORMAL LOW (ref 4.22–5.81)
RDW: 14.3 % (ref 11.5–15.5)
WBC: 4.7 10*3/uL (ref 4.0–10.5)
nRBC: 0 % (ref 0.0–0.2)

## 2022-11-09 LAB — GLUCOSE, CAPILLARY: Glucose-Capillary: 93 mg/dL (ref 70–99)

## 2022-11-09 MED ORDER — ALTEPLASE 2 MG IJ SOLR: 2.0000 mg | Freq: Once | INTRAMUSCULAR | Status: DC | PRN

## 2022-11-09 MED ORDER — ANTICOAGULANT SODIUM CITRATE 4% (200MG/5ML) IV SOLN: 5.0000 mL | Status: DC | PRN

## 2022-11-09 MED ORDER — ALOGLIPTIN BENZOATE 12.5 MG PO TABS: 6.2500 mg | ORAL_TABLET | Freq: Every morning | ORAL | Status: DC

## 2022-11-09 MED ORDER — HEPARIN SODIUM (PORCINE) 1000 UNIT/ML IJ SOLN
INTRAMUSCULAR | Status: AC
  Administered 2022-11-09: 1000 [IU]
  Filled 2022-11-09: qty 4

## 2022-11-09 MED ORDER — HEPARIN SODIUM (PORCINE) 1000 UNIT/ML DIALYSIS
1000.0000 [IU] | INTRAMUSCULAR | Status: DC | PRN
  Filled 2022-11-09: qty 1

## 2022-11-09 MED ORDER — APIXABAN 5 MG PO TABS
5.0000 mg | ORAL_TABLET | Freq: Two times a day (BID) | ORAL | 3 refills | Status: AC
  Filled 2022-11-09 – 2022-12-09 (×2): qty 60, 30d supply, fill #0
  Filled 2023-03-13: qty 60, 30d supply, fill #1

## 2022-11-09 NOTE — Progress Notes (Addendum)
Contacted [facility] admissions to request an update on pt's final approval/acceptance due to payer change. Awaiting a response.   Olivia Canter Renal Navigator [phone]  Addendum at 10:35 am: Pt has been accepted at [facility] on MWF 11:55 chair time. Pt can start on Friday and will need to arrive at 11:00 to complete paperwork prior to treatment. Met with pt while pt receiving HD to discuss this option vs TCU option since pt now considered ESRD per nephrologist. Pt prefers to proceed with referral to [facility] at d/c. Update provided to attending, nephrologist, and pt's RN. Plan is for likely d/c later today after vein mapping. Arrangements added to pt's AVS and schedule letter provided to pt as well. Contacted renal NP to request that orders be sent to clinic. Advised clinic of pt's likely d/c today and that pt should start on Friday.

## 2022-11-09 NOTE — Progress Notes (Addendum)
[facility] Progress Note   Assessment/ Plan:   Troy Johnston is a/an 72 y.o. male with a past medical history significant for HTN, DM2, CKD, admitted for septic shock and enterobacter bacteremia.         AKI on CKD IV:  likely hemodynamically mediated in the setting of shock-  crt was 1.9 but that was 2.5 years before this presentation with crt of 8             - started CRRT on 7/25 and stopped on 7/27                          - Cr had been inching up day by day since CRRT stopped-  suspect he is at ESRD     -  resumed  HD 7/31, 8/2  - s/p [facility] 11/04/22  - will need CLIP- sent message to renal navigator-  is pending  - HD on MWF schedule here -   called [facility] for access, looking at possible OP placement  -  next HD today via [facility]   2.  Shock: septic with enterobacter bacteremia             - abx per primary-  has completed course             - TTE no obvious vegetation              -No longer requiring pressors    3.  Elevated troponins/ NSTEMI/PEA arrest/Afib w/ RVR             - cardiology following             - anticoagulation per primary--> now getting switched from hep gtt to Eliquis   4.  Acute hypoxic RF:             - now extubated and much improved     5.  Anemia -  on ferrlecit -  also added ESA - still low  6. Bones-  check PTH (pending) -  phos ok no binder so far   7. HTN/volume-  no BP meds at present-  was on 3 as OP-  have resumed his losartan - no change today   Subjective:    No major issues overnight-  appreciate [facility]-  thinking access will be OP later given what he has gone thru-  renal navigator now looking in [place] for clinic -  seen in HD   Objective:   BP (!) 147/45 (BP Location: Left Arm)   Pulse 65   Temp 97.7 F (36.5 C) (Oral)   Resp 17   Ht 5\' 8"  (1.727 m)   Wt 94.1 kg   SpO2 99%   BMI 31.54 kg/m   Intake/Output Summary (Last 24 hours) at 11/09/2022 0752 Last data filed at 11/08/2022 1659 Gross per 24 hour  Intake 790  ml  Output 500 ml  Net 290 ml    Weight change:   Physical Exam: Gen:NAD CVS: RRR Resp: muffled at bases Abd: soft Ext: no LE edema ACCESS: R internal jugular tunneled HD cath  Imaging: No results found.  Labs: BMET Recent Labs  Lab 11/03/22 0840 11/04/22 0227 11/05/22 0330 11/07/22 0947 11/09/22 0723  NA 132* 136 134* 134* 135  K 3.7 3.9 4.2 4.0 3.9  CL 97* 101 98 103 102  CO2 28 24 26 22 22   GLUCOSE 121* 108* 93 139* 99  BUN  47* 49* 30* 35* 37*  CREATININE 3.52* 3.82* 3.23* 4.31* 4.39*  CALCIUM 7.5* 8.0* 8.3* 8.0* 8.4*  PHOS  --  5.0* 4.3 4.1 4.0   CBC Recent Labs  Lab 11/04/22 0227 11/05/22 0330 11/07/22 0948 11/09/22 0719  WBC 5.9 5.3 4.5 4.7  NEUTROABS  --   --  2.2  --   HGB 8.2* 8.9* 7.8* 7.9*  HCT 26.6* 28.8* 25.7* 25.8*  MCV 85.0 87.5 87.7 86.3  PLT 166 233 247 244    Medications:     apixaban  5 mg Oral BID   Chlorhexidine Gluconate Cloth  6 each Topical Daily   Chlorhexidine Gluconate Cloth  6 each Topical Q0600   Chlorhexidine Gluconate Cloth  6 each Topical Q0600   darbepoetin (ARANESP) injection - DIALYSIS  150 mcg Subcutaneous Q Mon-1800   docusate sodium  100 mg Oral BID   feeding supplement  237 mL Oral TID BM   insulin aspart  0-15 Units Subcutaneous TID WC   insulin aspart  0-5 Units Subcutaneous QHS   insulin glargine-yfgn  6 Units Subcutaneous Daily   losartan  50 mg Oral QHS   multivitamin  1 tablet Oral QHS   pantoprazole  40 mg Oral QHS   polyethylene glycol  17 g Oral Daily   sodium chloride flush  10-40 mL Intracatheter Q12H    A   11/09/2022, 7:52 AM

## 2022-11-09 NOTE — TOC Transition Note (Addendum)
Transition of Care [facility]) - CM/SW Discharge Note   Patient Details  Name: Troy Johnston MRN: [id number] Date of Birth: 1950/12/02  Transition of [facility] CM/SW Contact:  Gordy Clement, RN Phone Number: 11/09/2022, 11:13 AM   Clinical Narrative:     Patient will DC to home today  He lives with Nephew  Patient will receive OPHD services at [facility] on MWF 11:55 chair time.  A shower chair has been requested from the [place] to be delivered to home. [facility] will provide HHPT and OT.  Family to transport home. No additional TOC needs    CM has called Taryn at the [place] [phone])  per "TOC HANDOFF" with DC date and information that a fax has been sent to request a shower chair be delivered to the home      Barriers to Discharge: Continued Medical Work up   Patient Goals and CMS Choice      Discharge Placement                         Discharge Plan and Services Additional resources added to the After Visit Summary for                                       Social Determinants of Health (SDOH) Interventions SDOH Screenings   Food Insecurity: No Food Insecurity (10/27/2022)  Housing: Low Risk  (10/27/2022)  Transportation Needs: No Transportation Needs (10/27/2022)  Utilities: Not At Risk (10/27/2022)  Alcohol Screen: Low Risk  (10/27/2022)  Financial Resource Strain: Low Risk  (10/27/2022)  Tobacco Use: Low Risk  (10/27/2022)  Health Literacy: Adequate Health Literacy (10/27/2022)     Readmission Risk Interventions     No data to display

## 2022-11-09 NOTE — Progress Notes (Signed)
Upper extremity venous mapping completed. Please see CV Procedures for preliminary results.  Shona Simpson, RVT 11/09/22 3:23 PM

## 2022-11-09 NOTE — Progress Notes (Signed)
POST HD TX NOTE  11/09/22 1209  Vitals  Temp 98.1 F (36.7 C)  Temp Source Oral  BP (!) 147/58  MAP (mmHg) 82  BP Location Left Arm  BP Method Automatic  Patient Position (if appropriate) Lying  Pulse Rate 67  Pulse Rate Source Monitor  ECG Heart Rate 68  Resp 18  Oxygen Therapy  SpO2 100 %  O2 Device Room Air  Pulse Oximetry Type Continuous  During Treatment Monitoring  Blood Flow Rate (mL/min) 0 mL/min  Arterial Pressure (mmHg) -60.8 mmHg  Venous Pressure (mmHg) 34.74 mmHg  TMP (mmHg) -5.05 mmHg  Ultrafiltration Rate (mL/min) 481 mL/min  Dialysate Flow Rate (mL/min) 300 ml/min  Intra-Hemodialysis Comments (S)   (post HD tx VS check)  Dialysate Potassium Concentration 3  Dialysate Calcium Concentration 2.5  Post Treatment  Dialyzer Clearance Lightly streaked  Duration of HD Treatment -hour(s) 3.5 hour(s)  Hemodialysis Intake (mL) 0 mL  Liters Processed 84  Fluid Removed (mL) 1000.09 mL  Tolerated HD Treatment Yes  Post-Hemodialysis Comments (S)  tx completed w/o problme, UF goal met, blood rinsed back, VSS. Medication Admin: Heparin Dwells 3200 units  Hemodialysis Catheter Right Internal jugular Double lumen Permanent (Tunneled)  Placement Date/Time: 11/04/22 0849   Placed prior to admission: No  Serial / Lot #: [id number]  Expiration Date: 04/04/27  Time Out: Correct patient;Correct site;Correct procedure  Maximum sterile barrier precautions: Hand hygiene;Cap;Mask;Sterile gown...  Site Condition No complications  Blue Lumen Status Heparin locked;Dead end cap in place  Red Lumen Status Heparin locked;Dead end cap in place  Purple Lumen Status N/A  Catheter fill solution Heparin 1000 units/ml  Catheter fill volume (Arterial) 1.6 cc  Catheter fill volume (Venous) 1.6  Dressing Type Transparent  Dressing Status Antimicrobial disc in place;Clean, Dry, Intact  Drainage Description None  Dressing Change Due 11/12/22  Post treatment catheter status Capped and Clamped

## 2022-11-09 NOTE — Progress Notes (Signed)
Nutrition Follow-up  DOCUMENTATION CODES:   Not applicable  INTERVENTION:  Continue Regular diet as ordered Ensure Enlive po TID, each supplement provides 350 kcal and 20 grams of protein. Renal MVI with minerals daily "Nutrition for People on Dialysis" handout provided for patient  NUTRITION DIAGNOSIS:   Increased nutrient needs related to acute illness (AKI on CRRT) as evidenced by estimated needs. - remains applicable  GOAL:   Patient will meet greater than or equal to 90% of their needs - goal unmet, progressing  MONITOR:   PO intake, Supplement acceptance, Labs, I & O's, Weight trends  REASON FOR ASSESSMENT:   Consult Diet education (new ESRD)  ASSESSMENT:   72 year old male who presented to the ED on 7/23 with SOB. PMH of T2DM, prostate cancer in remission, PUD, GI bleed, CKD stage IV, GERD, HTN. Pt admitted with septic vs cardiogenic shock, AKI on CKD stage IV.  07/24 - cardiac arrest, intubated, found to have bacteremia and new systolic CHF 07/25 - CRRT start, extubated 07/26 - diet advanced to renal with 1200 ml fluid restriction 07/29 - transferred to floor 07/31 - initiated iHD 08/02 - s/p [facility] placement  Pt planned for discharge today  Spoke with pt at bedside. He endorses ongoing poor appetite. Provided handout on nutrition recommendations for dialysis. Encouraged adequate protein intake with all meals/snacks, encouraged ongoing use of nutrition supplements to augment intake.   Pt states that he has been managing his diabetes/blood sugar via diet PTA. Had lots of great questions about his health which RD recommended pt ask Nephrologist.   Informed pt that he will be followed by a Dietitian at the dialysis center.   No updated meal completions to review since 7/29. Continues to consume at least 2 Ensure daily.   Edema: mild generalized, mild pitting BUE/BLE  Medications: colace, SSI 0-15 units TID, SSI 0-5 units at bedtime, semglee 6 units daily,  rena-vit, protonix, miralax  Labs: BUN 37, Cr 4.39, albumin 2.4, GFR 13, CBG's 92-152 x24 hours  Last HD today (08/07) Net UF: 1L Post HD weight: 98.5 kg  Diet Order:   Diet Order             Diet - low sodium heart healthy           Diet Carb Modified           Diet regular Room service appropriate? Yes; Fluid consistency: Thin; Fluid restriction: 1200 mL Fluid  Diet effective now                   EDUCATION NEEDS:   Education needs have been addressed  Skin:  Skin Assessment: Reviewed RN Assessment  Last BM:  8/6  Height:   Ht Readings from Last 1 Encounters:  10/26/22 5\' 8"  (1.727 m)    Weight:   Wt Readings from Last 1 Encounters:  11/09/22 98.5 kg    Ideal Body Weight:  70 kg  BMI:  Body mass index is 33.02 kg/m.  Estimated Nutritional Needs:   Kcal:  2100-2300  Protein:  120-140 grams  Fluid:  >1.8 L  Drusilla Kanner, RDN, LDN Clinical Nutrition

## 2022-11-09 NOTE — Discharge Summary (Signed)
PATIENT DETAILS Name: Troy Johnston Age: 72 y.o. Sex: male Date of Birth: [birth year] MRN: [id number]. Admitting Physician: Oretha Milch, MD OVF:IEPPIR, Lenn Sink  Admit Date: 10/25/2022 Discharge date: 11/09/2022  Recommendations for Outpatient Follow-up:  Follow up with PCP in 1-2 weeks Please obtain CMP/CBC in one week Please ensure follow-up with nephrology and cardiology.   Admitted From:  Home  Disposition: Home health   Discharge Condition: good  CODE STATUS:   Code Status: DNR   Diet recommendation:  Diet Order             Diet - low sodium heart healthy           Diet Carb Modified           Diet regular Room service appropriate? Yes; Fluid consistency: Thin; Fluid restriction: 1200 mL Fluid  Diet effective now                    Brief Summary: Patient is a 72 y.o.  male with history of CKD 4, HTN, DM-2 who presented with dyspnea/altered mental status-found to have multifactorial shock (septic and cardiogenic), AKI-Hospital course complicated by PEA arrest-with ROSC after 10 minutes.  Stabilized in the ICU-evaluated by advanced heart failure team-and nephrology-subsequently transferred to [facility] on 7/30.  See below for further details.   Significant events: 07/23>>: Admission to ICU 07/24>> AF-RVR >> amio gtt ,Placement of HD cath, PEA arrest ,intubated ,ROSC after 10 minutes , EF low at 20% 07/25>> Patient extubated, started CRRT , hypotensive and given amiodarone again 07/26>> weaned off Levophed 07/27>> CRRT stopped 07/30>> transferred to [facility] 07/31>> worsening creatinine-underwent HD.   Significant studies: 7/23>> CXR: Possible retrocardiac opacities 7/23>> renal ultrasound: Mild right-sided hydronephrosis 7/24>> echo: EF 20-25%, RV systolic function severely reduced. 7/29>> repeat limited echo: EF 40-45%, RV systolic function normal   Significant microbiology data: 7/23>> COVID/influenza/RSV PCR: Negative 7/23>> blood culture:  Enterobacter cloacae 7/24>> stool C. difficile: Negative 7/24>> stool GI pathogen panel: Norovirus   Procedures: 7/24-7/25>> ETT 8/02>> tunneled dialysis catheter by IR   Consults: Cardiology Nephrology PCCM Vascular surgery  Brief Hospital Course: Multifactorial shock-septic and cardiogenic shock Enterobacter cloacae bacteremia Norovirus infection Shock physiology significantly better-BP stable Completed course of antibiotics. Continue to monitor off antibiotics   Acute biventricular systolic heart failure Volume status improved with dialysis. Repeat echo on 7/29 with improvement in LVEF and RV systolic function Cardiology had contemplated starting GDMT medications-but since patient has been started on intermittent hemodialysis-and at risk for blood pressure drops-cardiology holding off on starting GDMT meds.    PEA cardiac arrest V-fib s/p defibrillation Paroxysmal atrial fibrillation Cardiac arrest felt to be in the setting of septic shock Did not tolerate amiodarone-thankfully maintaining sinus rhythm Was on IV heparin-since no more procedures planned-stop IV heparin-transition to Eliquis.   AKI on CKD 4-now likely ESRD  AKI hemodynamically mediated-in the setting of hypotension/shock Per nephrology-now likely ESRD Outpatient arrangements have been [facility] placed 8/2 Vascular surgery planning permanent access as an outpatient   Acute hypoxic respiratory failure Intubated in the setting of cardiac arrest-extubated on 7/25-currently doing well on room air.   Shock liver Secondary to septic shock-significant improvement in liver enzymes. Follow liver enzymes periodically   DM-2 (A1c 7.5 on 7/24) CBG stable on SSI/Semglee during this hospitalization Need to hold glipizide-discussed with pharmacy-resume alogliptin on discharge. Follow-up with PCP for further optimization.     HLD Patient is statins were held-due to elevated LFTs-this will be resumed on  discharge.    HTN BP fluctuating-all antihypertensives on hold-reassess as an outpatient.     Nutrition Status: Nutrition Problem: Increased nutrient needs Etiology: acute illness (AKI on CRRT) Signs/Symptoms: estimated needs Interventions: Ensure Enlive (each supplement provides 350kcal and 20 grams of protein), MVI, Liberalize Diet   Obesity: Estimated body mass index is 31.54 kg/m as calculated from the following:   Height as of this encounter: 5\' 8"  (1.727 m).   Weight as of this encounter: 94.1 kg.   Discharge Diagnoses:  Principal Problem:   Acute kidney injury superimposed on chronic kidney disease (HCC) Active Problems:   Cardiac arrest, cause unspecified (HCC)   Septic shock (HCC)   Acute respiratory failure with hypoxia [facility])   Discharge Instructions:  Activity:  As tolerated with Full fall precautions use walker/cane & assistance as needed  Discharge Instructions     (HEART FAILURE PATIENTS) Call MD:  Anytime you have any of the following symptoms: 1) 3 pound weight gain in 24 hours or 5 pounds in 1 week 2) shortness of breath, with or without a dry hacking cough 3) swelling in the hands, feet or stomach 4) if you have to sleep on extra pillows at night in order to breathe.   Complete by: As directed    Call MD for:  difficulty breathing, headache or visual disturbances   Complete by: As directed    Diet - low sodium heart healthy   Complete by: As directed    Diet Carb Modified   Complete by: As directed    Discharge instructions   Complete by: As directed    Follow with Primary MD  [facility] in 1-2 weeks  Follow-up with cardiology and nephrology as instructed.  Please get a complete blood count and chemistry panel checked by your Primary MD at your next visit, and again as instructed by your Primary MD.  Get Medicines reviewed and adjusted: Please take all your medications with you for your next visit with your Primary MD  Laboratory/radiological  data: Please request your Primary MD to go over all hospital tests and procedure/radiological results at the follow up, please ask your Primary MD to get all Hospital records sent to his/her office.  In some cases, they will be blood work, cultures and biopsy results pending at the time of your discharge. Please request that your primary care M.D. follows up on these results.  Also Note the following: If you experience worsening of your admission symptoms, develop shortness of breath, life threatening emergency, suicidal or homicidal thoughts you must seek medical attention immediately by calling 911 or calling your MD immediately  if symptoms less severe.  You must read complete instructions/literature along with all the possible adverse reactions/side effects for all the Medicines you take and that have been prescribed to you. Take any new Medicines after you have completely understood and accpet all the possible adverse reactions/side effects.   Do not drive when taking Pain medications or sleeping medications (Benzodaizepines)  Do not take more than prescribed Pain, Sleep and Anxiety Medications. It is not advisable to combine anxiety,sleep and pain medications without talking with your primary care practitioner  Special Instructions: If you have smoked or chewed Tobacco  in the last 2 yrs please stop smoking, stop any regular Alcohol  and or any Recreational drug use.  Wear Seat belts while driving.  Please note: You were cared for by a hospitalist during your hospital stay. Once you are discharged, your primary care physician will  handle any further medical issues. Please note that NO REFILLS for any discharge medications will be authorized once you are discharged, as it is imperative that you return to your primary care physician (or establish a relationship with a primary care physician if you do not have one) for your post hospital discharge needs so that they can reassess your need for  medications and monitor your lab values.   Increase activity slowly   Complete by: As directed    No wound care   Complete by: As directed       Allergies as of 11/09/2022       Reactions   Ferrous Sulfate Other (See Comments)   Penicillins Hives, Other (See Comments)   Has patient had a PCN reaction causing immediate rash, facial/tongue/throat swelling, SOB or lightheadedness with hypotension: no Has patient had a PCN reaction causing severe rash involving mucus membranes or skin necrosis: no Has patient had a PCN reaction that required hospitalization: yes Has patient had a PCN reaction occurring within the last 10 years: no If all of the above answers are "NO", then may proceed with Cephalosporin use.        Medication List     STOP taking these medications    carvedilol 25 MG tablet Commonly known as: COREG   glipiZIDE 10 MG tablet Commonly known as: GLUCOTROL   hydrALAZINE 100 MG tablet Commonly known as: APRESOLINE   losartan 100 MG tablet Commonly known as: COZAAR       TAKE these medications    allopurinol 100 MG tablet Commonly known as: ZYLOPRIM Take 50 mg by mouth every other day. For high uric acid level   Alogliptin Benzoate 12.5 MG Tabs Take 6.25 mg by mouth every morning. For diabetes (replaces metformin)   apixaban 5 MG Tabs tablet Commonly known as: ELIQUIS Take 1 tablet (5 mg total) by mouth 2 (two) times daily.   atorvastatin 20 MG tablet Commonly known as: LIPITOR Take 20 mg by mouth at bedtime. For cholesterol        Follow-up Information     [facility] Follow up.   Specialty: Home Health Services Why: Someone will call you to schedule first home visit Contact information: [address] [place] [address] [phone]         Thomasene Ripple, DO Follow up.   Specialty: Cardiology Why: The office will call with a follow-up appointment. Contact information: [address] [place]  [address] [phone]         [facility] Follow up.   Specialty: Cardiology Why: Hospital Follow-up in the Advanced Heart Failure Clinic at [facility] An, Entrance C  11/22/22 at 9:30 AM Contact information: [address] [place] [address] [phone]        Ginette Otto, [facility]. Go on 11/11/2022.   Why: Schedule is Monday,Wednesday,Friday with 11:55 chair time.  On Friday, please arrive at 11:00 am to complete paperwork prior to treatment. Contact information: [address] [place] [address] [phone]                Allergies  Allergen Reactions   Ferrous Sulfate Other (See Comments)   Penicillins Hives and Other (See Comments)    Has patient had a PCN reaction causing immediate rash, facial/tongue/throat swelling, SOB or lightheadedness with hypotension: no Has patient had a PCN reaction causing severe rash involving mucus membranes or skin  necrosis: no Has patient had a PCN reaction that required hospitalization: yes Has patient had a PCN reaction occurring within the last 10 years: no If all of the above answers are "NO", then may proceed with Cephalosporin use.     Other Procedures/Studies: IR Fluoro Guide CV Line Right  Result Date: 11/04/2022 CLINICAL DATA:  Renal failure, needs durable venous access for hemodialysis EXAM: TUNNELED HEMODIALYSIS CATHETER PLACEMENT WITH ULTRASOUND AND FLUOROSCOPIC GUIDANCE TECHNIQUE: The procedure, risks, benefits, and alternatives were explained to the patient. Questions regarding the procedure were encouraged and answered. The patient understands and consents to the procedure. As antibiotic prophylaxis, vancomycin 1 g was ordered pre-procedure and administered intravenously within one hour of incision.Patency of the right IJ vein was confirmed with ultrasound with image documentation. An appropriate skin site was determined.  Region was prepped using maximum barrier technique including cap and mask, sterile gown, sterile gloves, large sterile sheet, and Chlorhexidine as cutaneous antisepsis. The region was infiltrated locally with 1% lidocaine. Intravenous Fentanyl and Versed 1mg  were administered as conscious sedation during continuous monitoring of the patient's level of consciousness and physiological / cardiorespiratory status by the radiology RN, with a total moderate sedation time of 11 minutes. Under real-time ultrasound guidance, the right IJ vein was accessed with a 21 gauge micropuncture needle; the needle tip within the vein was confirmed with ultrasound image documentation. Needle exchanged over the 018 guidewire for transitional dilator, which allowed advancement of a Benson wire into the IVC. Over this, an MPA catheter was advanced. A Palindrome 19 hemodialysis catheter was tunneled from the right anterior chest wall approach to the right IJ dermatotomy site. The MPA catheter was exchanged over an Amplatz wire for serial vascular dilators which allow placement of a peel-away sheath, through which the catheter was advanced under intermittent fluoroscopy, positioned with its tips in the proximal and midright atrium. Spot chest radiograph confirms good catheter position. No pneumothorax. Catheter was flushed and primed per protocol. Catheter secured externally with O Prolene sutures. The right IJ dermatotomy site was closed with Dermabond. COMPLICATIONS: COMPLICATIONS None immediate FLUOROSCOPY: Radiation Exposure Index (as provided by the fluoroscopic device): 5 mGy air Kerma COMPARISON:  None Available. IMPRESSION: 1. Technically successful placement of tunneled right IJ hemodialysis catheter with ultrasound and fluoroscopic guidance. Ready for routine use. ACCESS: Remains approachable for percutaneous intervention as needed. Electronically Signed   By: Corlis Leak M.D.   On: 11/04/2022 12:19   IR US Guide Vasc Access  Right  Result Date: 11/04/2022 CLINICAL DATA:  Renal failure, needs durable venous access for hemodialysis EXAM: TUNNELED HEMODIALYSIS CATHETER PLACEMENT WITH ULTRASOUND AND FLUOROSCOPIC GUIDANCE TECHNIQUE: The procedure, risks, benefits, and alternatives were explained to the patient. Questions regarding the procedure were encouraged and answered. The patient understands and consents to the procedure. As antibiotic prophylaxis, vancomycin 1 g was ordered pre-procedure and administered intravenously within one hour of incision.Patency of the right IJ vein was confirmed with ultrasound with image documentation. An appropriate skin site was determined. Region was prepped using maximum barrier technique including cap and mask, sterile gown, sterile gloves, large sterile sheet, and Chlorhexidine as cutaneous antisepsis. The region was infiltrated locally with 1% lidocaine. Intravenous Fentanyl and Versed 1mg  were administered as conscious sedation during continuous monitoring of the patient's level of consciousness and physiological / cardiorespiratory status by the radiology RN, with a total moderate sedation time of 11 minutes. Under real-time ultrasound guidance, the right IJ vein was accessed with a 21 gauge micropuncture needle;  the needle tip within the vein was confirmed with ultrasound image documentation. Needle exchanged over the 018 guidewire for transitional dilator, which allowed advancement of a Benson wire into the IVC. Over this, an MPA catheter was advanced. A Palindrome 19 hemodialysis catheter was tunneled from the right anterior chest wall approach to the right IJ dermatotomy site. The MPA catheter was exchanged over an Amplatz wire for serial vascular dilators which allow placement of a peel-away sheath, through which the catheter was advanced under intermittent fluoroscopy, positioned with its tips in the proximal and midright atrium. Spot chest radiograph confirms good catheter position. No  pneumothorax. Catheter was flushed and primed per protocol. Catheter secured externally with O Prolene sutures. The right IJ dermatotomy site was closed with Dermabond. COMPLICATIONS: COMPLICATIONS None immediate FLUOROSCOPY: Radiation Exposure Index (as provided by the fluoroscopic device): 5 mGy air Kerma COMPARISON:  None Available. IMPRESSION: 1. Technically successful placement of tunneled right IJ hemodialysis catheter with ultrasound and fluoroscopic guidance. Ready for routine use. ACCESS: Remains approachable for percutaneous intervention as needed. Electronically Signed   By: Corlis Leak M.D.   On: 11/04/2022 12:19   ECHOCARDIOGRAM LIMITED  Result Date: 10/31/2022    ECHOCARDIOGRAM LIMITED REPORT   Patient Name:   Troy Johnston Date of Exam: 10/31/2022 Medical Rec #:  [id number]        Height:       68.0 in Accession #:    [id number]       Weight:       220.5 lb Date of Birth:  01/11/1951        BSA:          2.130 m Patient Age:    71 years         BP:           127/50 mmHg Patient Gender: M                HR:           69 bpm. Exam Location:  Inpatient Procedure: Limited Echo, Limited Color Doppler and Intracardiac Opacification            Agent Indications:    CHF-Acute Systolic I50.21  History:        Patient has prior history of Echocardiogram examinations, most                 recent 10/26/2022.  Sonographer:    Lucendia Herrlich Referring Phys: [id number] LINDSAY NICOLE FINCH IMPRESSIONS  1. Left ventricular ejection fraction, by estimation, is 40 to 45%. The left ventricle has mildly decreased function. The left ventricle has no regional wall motion abnormalities. There is moderate concentric left ventricular hypertrophy. Left ventricular diastolic parameters are consistent with Grade I diastolic dysfunction (impaired relaxation).  2. Right ventricular systolic function is normal. The right ventricular size is normal.  3. The mitral valve is normal in structure. No evidence of mitral valve  regurgitation. No evidence of mitral stenosis.  4. The aortic valve is normal in structure. There is mild calcification of the aortic valve. Aortic valve regurgitation is not visualized. No aortic stenosis is present.  5. The inferior vena cava is normal in size with greater than 50% respiratory variability, suggesting right atrial pressure of 3 mmHg. FINDINGS  Left Ventricle: Left ventricular ejection fraction, by estimation, is 40 to 45%. The left ventricle has mildly decreased function. The left ventricle has no regional wall motion abnormalities. Definity contrast agent was given IV to delineate the  left ventricular endocardial borders. The left ventricular internal cavity size was normal in size. There is moderate concentric left ventricular hypertrophy. Left ventricular diastolic parameters are consistent with Grade I diastolic dysfunction (impaired relaxation). Right Ventricle: The right ventricular size is normal. No increase in right ventricular wall thickness. Right ventricular systolic function is normal. Left Atrium: Left atrial size was normal in size. Right Atrium: Right atrial size was normal in size. Pericardium: There is no evidence of pericardial effusion. Mitral Valve: The mitral valve is normal in structure. No evidence of mitral valve stenosis. Tricuspid Valve: The tricuspid valve is normal in structure. Tricuspid valve regurgitation is not demonstrated. No evidence of tricuspid stenosis. Aortic Valve: The aortic valve is normal in structure. There is mild calcification of the aortic valve. There is mild aortic valve annular calcification. Aortic valve regurgitation is not visualized. No aortic stenosis is present. Pulmonic Valve: The pulmonic valve was normal in structure. Pulmonic valve regurgitation is not visualized. No evidence of pulmonic stenosis. Aorta: The aortic root is normal in size and structure. Venous: The inferior vena cava is normal in size with greater than 50% respiratory  variability, suggesting right atrial pressure of 3 mmHg. IAS/Shunts: No atrial level shunt detected by color flow Doppler. LEFT VENTRICLE PLAX 2D LVIDd:         5.80 cm LVIDs:         4.80 cm LV PW:         1.00 cm LV IVS:        1.30 cm LVOT diam:     2.20 cm LVOT Area:     3.80 cm  IVC IVC diam: 1.70 cm LEFT ATRIUM         Index LA diam:    4.20 cm 1.97 cm/m   AORTA Ao Root diam: 3.10 cm Ao Asc diam:  2.90 cm TRICUSPID VALVE TR Peak grad:   6.7 mmHg TR Vmax:        129.00 cm/s  SHUNTS Systemic Diam: 2.20 cm Aditya Sabharwal Electronically signed by Dorthula Nettles Signature Date/Time: 10/31/2022/5:34:24 PM    Final    DG Abd 1 View  Result Date: 10/26/2022 CLINICAL DATA:  Check feeding catheter placement EXAM: ABDOMEN - 1 VIEW COMPARISON:  None Available. FINDINGS: Gastric catheter is noted within the stomach. No free air is seen. No obstructive changes are noted. IMPRESSION: Gastric catheter within the stomach. Electronically Signed   By: Alcide Clever M.D.   On: 10/26/2022 21:44   ECHOCARDIOGRAM COMPLETE  Result Date: 10/26/2022    ECHOCARDIOGRAM REPORT   Patient Name:   CHADI MIDYETTE Date of Exam: 10/26/2022 Medical Rec #:  [id number]        Height:       68.0 in Accession #:    [id number]       Weight:       222.9 lb Date of Birth:  July 09, 1950        BSA:          2.140 m Patient Age:    71 years         BP:           110/79 mmHg Patient Gender: M                HR:           103 bpm. Exam Location:  Inpatient Procedure: 2D Echo, Cardiac Doppler and Color Doppler STAT ECHO         REPORT CONTAINS  CRITICAL RESULT  Findings communicated to Dr. Servando Salina and Dr. [name]. Indications:    R00.9* Unspecified abnormalities of heart beat. Cardiac arrest  History:        Patient has no prior history of Echocardiogram examinations.                 Abnormal ECG; Arrythmias:Cardiac Arrest.  Sonographer:    Sheralyn Boatman RDCS Referring Phys: [id number] RAKESH V ALVA  Sonographer Comments: Patient is obese and echo performed with  patient supine and on artificial respirator. Dr. [name] present in room. Stat echo delayed. Patient was not stable. IMPRESSIONS  1. Left ventricular ejection fraction, by estimation, is 20 to 25%. The left ventricle has severely decreased function. The left ventricle demonstrates global hypokinesis. There is moderate concentric left ventricular hypertrophy. Left ventricular diastolic function could not be evaluated.  2. Right ventricular systolic function is severely reduced. The right ventricular size is normal. Tricuspid regurgitation signal is inadequate for assessing PA pressure.  3. Left atrial size was mildly dilated.  4. Right atrial size was mildly dilated.  5. The mitral valve is normal in structure. Trivial mitral valve regurgitation. No evidence of mitral stenosis.  6. The aortic valve is tricuspid. There is mild calcification of the aortic valve. There is mild thickening of the aortic valve. Aortic valve regurgitation is not visualized. No aortic stenosis is present. Comparison(s): No prior Echocardiogram. Conclusion(s)/Recommendation(s): Global ventricular dysfunction with normal ventricular sizes, no significant valve disease. FINDINGS  Left Ventricle: Left ventricular ejection fraction, by estimation, is 20 to 25%. The left ventricle has severely decreased function. The left ventricle demonstrates global hypokinesis. The left ventricular internal cavity size was normal in size. There is moderate concentric left ventricular hypertrophy. Left ventricular diastolic function could not be evaluated due to atrial fibrillation. Left ventricular diastolic function could not be evaluated. Right Ventricle: The right ventricular size is normal. No increase in right ventricular wall thickness. Right ventricular systolic function is severely reduced. Tricuspid regurgitation signal is inadequate for assessing PA pressure. Left Atrium: Left atrial size was mildly dilated. Right Atrium: Right atrial size was mildly  dilated. Pericardium: Trivial pericardial effusion is present. Mitral Valve: The mitral valve is normal in structure. Trivial mitral valve regurgitation. No evidence of mitral valve stenosis. Tricuspid Valve: The tricuspid valve is normal in structure. Tricuspid valve regurgitation is trivial. No evidence of tricuspid stenosis. Aortic Valve: The aortic valve is tricuspid. There is mild calcification of the aortic valve. There is mild thickening of the aortic valve. Aortic valve regurgitation is not visualized. No aortic stenosis is present. Pulmonic Valve: The pulmonic valve was not well visualized. Pulmonic valve regurgitation is trivial. No evidence of pulmonic stenosis. Aorta: The aortic root and ascending aorta are structurally normal, with no evidence of dilitation. Venous: IVC assessment for right atrial pressure unable to be performed due to mechanical ventilation. IAS/Shunts: The atrial septum is grossly normal.  LEFT VENTRICLE PLAX 2D LVIDd:         4.20 cm LVIDs:         3.40 cm LV PW:         1.55 cm LV IVS:        1.75 cm LVOT diam:     2.20 cm LV SV:         42 LV SV Index:   20 LVOT Area:     3.80 cm  LV Volumes (MOD) LV vol d, MOD A4C: 84.7 ml LV vol s, MOD A4C: 67.4 ml LV  SV MOD A4C:     84.7 ml RIGHT VENTRICLE         IVC TAPSE (M-mode): 0.8 cm  IVC diam: 2.10 cm LEFT ATRIUM             Index        RIGHT ATRIUM           Index LA diam:        3.90 cm 1.82 cm/m   RA Area:     19.70 cm LA Vol (A2C):   76.7 ml 35.84 ml/m  RA Volume:   50.90 ml  23.78 ml/m LA Vol (A4C):   64.6 ml 30.19 ml/m LA Biplane Vol: 72.3 ml 33.78 ml/m  AORTIC VALVE LVOT Vmax:   92.20 cm/s LVOT Vmean:  58.533 cm/s LVOT VTI:    0.111 m  AORTA Ao Root diam: 2.95 cm Ao Asc diam:  3.30 cm MITRAL VALVE MV Area (PHT): 5.13 cm    SHUNTS MV Decel Time: 148 msec    Systemic VTI:  0.11 m MV E velocity: 64.50 cm/s  Systemic Diam: 2.20 cm Jodelle Red MD Electronically signed by Jodelle Red MD Signature Date/Time:  10/26/2022/10:56:27 AM    Final    Portable Chest x-ray  Result Date: 10/26/2022 CLINICAL DATA:  Intubation EXAM: PORTABLE CHEST 1 VIEW COMPARISON:  10/26/2022 FINDINGS: Interval placement of endotracheal tube with distal tip tube terminating approximately 4.0 cm above the carina. Right IJ central venous catheter remains positioned in the SVC. Stable cardiomegaly. Pulmonary vascular congestion. Similar bilateral interstitial prominence. No pneumothorax. IMPRESSION: Interval placement of endotracheal tube with distal tip tube terminating approximately 4.0 cm above the carina. Electronically Signed   By: Duanne Guess D.O.   On: 10/26/2022 10:40   DG CHEST PORT 1 VIEW  Result Date: 10/26/2022 CLINICAL DATA:  72 year old male status post central line placement. EXAM: PORTABLE CHEST 1 VIEW COMPARISON:  Chest x-ray 10/25/2022. FINDINGS: Right internal jugular central venous catheter with tip terminating in the distal superior vena cava. Lung volumes are low. Probable bibasilar subsegmental atelectasis. Possible trace left pleural effusion. No pneumothorax. No definite right pleural effusion. Mild cephalization of the pulmonary vasculature, without frank pulmonary edema. Mild cardiomegaly. Upper mediastinal contours are distorted by patient's rotation to the left. IMPRESSION: 1. Support apparatus, as above. 2. Low lung volumes with probable bibasilar subsegmental atelectasis and trace left pleural effusion. 3. Cardiomegaly with pulmonary venous congestion, but no frank pulmonary edema. Electronically Signed   By: Trudie Reed M.D.   On: 10/26/2022 06:07   US RENAL  Result Date: 10/25/2022 CLINICAL DATA:  Acute kidney injury. EXAM: RENAL / URINARY TRACT ULTRASOUND COMPLETE COMPARISON:  None Available. FINDINGS: Right Kidney: Renal measurements: 11.1 cm x 6.1 cm x 4.6 cm = volume: 164.38 mL. Echogenicity within normal limits. There is mild right-sided hydronephrosis. A 4.1 cm x 3.0 cm x 3.6 cm simple cyst  is seen within the lower pole of the right kidney. Left Kidney: Renal measurements: 10.1 cm x 5.1 cm x 4.1 cm = volume: 112.15 mL. Echogenicity within normal limits. No mass or hydronephrosis visualized. Bladder: The urinary bladder is poorly visualized. Other: None. IMPRESSION: 1. Large, simple right renal cyst. 2. Mild right-sided hydronephrosis. Electronically Signed   By: Aram Candela M.D.   On: 10/25/2022 20:11   DG Chest Port 1 View  Result Date: 10/25/2022 CLINICAL DATA:  Shortness of breath. EXAM: PORTABLE CHEST 1 VIEW COMPARISON:  11/14/2019. FINDINGS: Enlarged cardiac silhouette. Low lung volume study with  possible retrocardiac opacities. No visible pleural effusions or pneumothorax. No acute bony abnormality. Left shoulder osteoarthritis. IMPRESSION: 1. Low lung volume study with possible retrocardiac opacities, most likely atelectasis. If there is concern for infection, dedicated PA and lateral radiographs could better assess. 2. Cardiomegaly. Electronically Signed   By: Feliberto Harts M.D.   On: 10/25/2022 16:36     TODAY-DAY OF DISCHARGE:  Subjective:   Madelaine Etienne today has no headache,no chest abdominal pain,no new weakness tingling or numbness, feels much better wants to go home today.   Objective:   Blood pressure (!) 143/63, pulse 69, temperature 98.7 F (37.1 C), temperature source Oral, resp. rate 17, height 5\' 8"  (1.727 m), weight 95.4 kg, SpO2 100%.  Intake/Output Summary (Last 24 hours) at 11/09/2022 1057 Last data filed at 11/09/2022 0900 Gross per 24 hour  Intake 540 ml  Output -346.49 ml  Net 886.49 ml   Filed Weights   11/07/22 1314 11/08/22 0500 11/09/22 0811  Weight: 93.8 kg 94.1 kg 95.4 kg    Exam: Awake Alert, Oriented *3, No new F.N deficits, Normal affect [place].AT,PERRAL Supple Neck,No JVD, No cervical lymphadenopathy appriciated.  Symmetrical Chest wall movement, Good air movement bilaterally, CTAB RRR,No Gallops,Rubs or new Murmurs, No  Parasternal Heave +ve B.Sounds, Abd Soft, Non tender, No organomegaly appriciated, No rebound -guarding or rigidity. No Cyanosis, Clubbing or edema, No new Rash or bruise   PERTINENT RADIOLOGIC STUDIES: No results found.   PERTINENT LAB RESULTS: CBC: Recent Labs    11/07/22 0948 11/09/22 0719  WBC 4.5 4.7  HGB 7.8* 7.9*  HCT 25.7* 25.8*  PLT 247 244   CMET CMP     Component Value Date/Time   NA 135 11/09/2022 0723   K 3.9 11/09/2022 0723   CL 102 11/09/2022 0723   CO2 22 11/09/2022 0723   GLUCOSE 99 11/09/2022 0723   BUN 37 (H) 11/09/2022 0723   CREATININE 4.39 (H) 11/09/2022 0723   CALCIUM 8.4 (L) 11/09/2022 0723   PROT 6.1 (L) 11/03/2022 0840   ALBUMIN 2.4 (L) 11/09/2022 0723   AST 36 11/03/2022 0840   ALT 98 (H) 11/03/2022 0840   ALKPHOS 81 11/03/2022 0840   BILITOT 0.9 11/03/2022 0840   GFRNONAA 13 (L) 11/09/2022 0723    GFR Estimated Creatinine Clearance: 16.8 mL/min (A) (by C-G formula based on SCr of 4.39 mg/dL (H)). No results for input(s): "LIPASE", "AMYLASE" in the last 72 hours. No results for input(s): "CKTOTAL", "CKMB", "CKMBINDEX", "TROPONINI" in the last 72 hours. Invalid input(s): "POCBNP" No results for input(s): "DDIMER" in the last 72 hours. No results for input(s): "HGBA1C" in the last 72 hours. No results for input(s): "CHOL", "HDL", "LDLCALC", "TRIG", "CHOLHDL", "LDLDIRECT" in the last 72 hours. No results for input(s): "TSH", "T4TOTAL", "T3FREE", "THYROIDAB" in the last 72 hours.  Invalid input(s): "FREET3" No results for input(s): "VITAMINB12", "FOLATE", "FERRITIN", "TIBC", "IRON", "RETICCTPCT" in the last 72 hours. Coags: No results for input(s): "INR" in the last 72 hours.  Invalid input(s): "PT" Microbiology: No results found for this or any previous visit (from the past 240 hour(s)).  FURTHER DISCHARGE INSTRUCTIONS:  Get Medicines reviewed and adjusted: Please take all your medications with you for your next visit with your  Primary MD  Laboratory/radiological data: Please request your Primary MD to go over all hospital tests and procedure/radiological results at the follow up, please ask your Primary MD to get all Hospital records sent to his/her office.  In some cases, they will be blood  work, cultures and biopsy results pending at the time of your discharge. Please request that your primary care M.D. goes through all the records of your hospital data and follows up on these results.  Also Note the following: If you experience worsening of your admission symptoms, develop shortness of breath, life threatening emergency, suicidal or homicidal thoughts you must seek medical attention immediately by calling 911 or calling your MD immediately  if symptoms less severe.  You must read complete instructions/literature along with all the possible adverse reactions/side effects for all the Medicines you take and that have been prescribed to you. Take any new Medicines after you have completely understood and accpet all the possible adverse reactions/side effects.   Do not drive when taking Pain medications or sleeping medications (Benzodaizepines)  Do not take more than prescribed Pain, Sleep and Anxiety Medications. It is not advisable to combine anxiety,sleep and pain medications without talking with your primary care practitioner  Special Instructions: If you have smoked or chewed Tobacco  in the last 2 yrs please stop smoking, stop any regular Alcohol  and or any Recreational drug use.  Wear Seat belts while driving.  Please note: You were cared for by a hospitalist during your hospital stay. Once you are discharged, your primary care physician will handle any further medical issues. Please note that NO REFILLS for any discharge medications will be authorized once you are discharged, as it is imperative that you return to your primary care physician (or establish a relationship with a primary care physician if you do  not have one) for your post hospital discharge needs so that they can reassess your need for medications and monitor your lab values.  Total Time spent coordinating discharge including counseling, education and face to face time equals greater than 30 minutes.  SignedJeoffrey Massed 11/09/2022 10:57 AM

## 2022-11-09 NOTE — Plan of Care (Signed)
Patient is discharging home with home health and family

## 2022-11-10 NOTE — TOC Transition Note (Signed)
Transition of Care - Initial Contact from Inpatient Facility  Date of discharge: 11/09/22 Date of contact: 11/10/22  Method: Phone Spoke to: Patient  Patient contacted to discuss transition of care from recent inpatient hospitalization. Patient was admitted to [facility] from 10/25/22-11/09/22 with discharge diagnosis of septic shock, enterobacteremia, , AKI on CKD IV, and s/p PEA arrest, ROSC obtained  The discharge medication list was reviewed. Patient understands the changes and has no concerns.   Patient will start to his outpatient HD unit on: 11/11/22 at [facility]  No other concerns at this time.   Salome Holmes, NP

## 2022-11-16 ENCOUNTER — Telehealth: Payer: Self-pay

## 2022-11-16 NOTE — Telephone Encounter (Signed)
Noted patient will need cardiac clearance after his recent hospitalization and already has an appointment to follow up with cardiology. Will fax clearance request to office.    ----- Message -----  From: Chuck Hint, MD  Sent: 11/10/2022   7:24 AM EDT  To: [facility] Clinical Pool  Subject: schedule                                       This patient was discharged.  He needs to be scheduled for a left AV fistula or AV graft in the next 2 to 3 weeks.  I am not sure what days he has dialysis.  CD

## 2022-11-17 ENCOUNTER — Telehealth: Payer: Self-pay

## 2022-11-17 NOTE — Telephone Encounter (Signed)
Pt has an appt with our HF Clinic 11/22/22. Per Edd Fabian, FNP ok to defer clearance to appt with HF.

## 2022-11-17 NOTE — Telephone Encounter (Signed)
Patient with diagnosis of afib on Eliquis for anticoagulation.    Procedure:  LEFT AV FISTULA AV GRAFT  Date of procedure: TBD   CHA2DS2-VASc Score = 4   This indicates a 4.8% annual risk of stroke. The patient's score is based upon: CHF History: 1 HTN History: 0 Diabetes History: 1 Stroke History: 0 Vascular Disease History: 1 Age Score: 1 Gender Score: 0      On HD Platelet count 244  Per office protocol, patient can hold Eliquis for 3 days prior to procedure.    **This guidance is not considered finalized until pre-operative APP has relayed final recommendations.**

## 2022-11-17 NOTE — Telephone Encounter (Signed)
    Primary Cardiologist:None  Chart reviewed as part of pre-operative protocol coverage. Because of Leopoldo Colaluca Felker's past medical history and time since last visit, he/she will require a follow-up visit in order to better assess preoperative cardiovascular risk.  Pre-op covering staff: - Please schedule  Office appointment and call patient to inform them. - Please contact requesting surgeon's office via preferred method (i.e, phone, fax) to inform them of need for appointment prior to surgery.  If applicable, this message will also be routed to pharmacy pool and/or primary cardiologist for input on holding anticoagulant/antiplatelet agent as requested below so that this information is available at time of patient's appointment.   Ronney Asters, NP  11/17/2022, 1:01 PM

## 2022-11-17 NOTE — Telephone Encounter (Signed)
   Pre-operative Risk Assessment    Patient Name: Troy Johnston  DOB: Aug 05, 1950 MRN: [id number]      Request for Surgical Clearance    Procedure:   LEFT AV FISTULA AV GRAFT  Date of Surgery:  Clearance TBD                                 Surgeon:  DR. DICKSON OR ANOTHER [facility] SURGEON Surgeon's Group or Practice Name:  [facility] Phone number:  [phone] Fax number:  [phone]   Type of Clearance Requested:   - Pharmacy:  Hold Apixaban (Eliquis) 48-72 HOURS PRIOR   Type of Anesthesia:  MAC   Additional requests/questions:    Signed, Michaelle Copas   11/17/2022, 10:44 AM

## 2022-11-22 ENCOUNTER — Encounter (HOSPITAL_COMMUNITY): Payer: Self-pay

## 2022-11-22 ENCOUNTER — Ambulatory Visit (HOSPITAL_COMMUNITY)
Admit: 2022-11-22 | Discharge: 2022-11-22 | Disposition: A | Discharge status: Home / Self Care | Payer: No Typology Code available for payment source | Attending: Family Medicine | Admitting: Family Medicine

## 2022-11-22 VITALS — BP 140/72 | HR 78 | Wt 209.0 lb

## 2022-11-22 DIAGNOSIS — I480 Paroxysmal atrial fibrillation: Secondary | ICD-10-CM

## 2022-11-22 DIAGNOSIS — I4891 Unspecified atrial fibrillation: Secondary | ICD-10-CM | POA: Diagnosis not present

## 2022-11-22 DIAGNOSIS — Z8546 Personal history of malignant neoplasm of prostate: Secondary | ICD-10-CM | POA: Insufficient documentation

## 2022-11-22 DIAGNOSIS — I252 Old myocardial infarction: Secondary | ICD-10-CM | POA: Diagnosis not present

## 2022-11-22 DIAGNOSIS — Z79899 Other long term (current) drug therapy: Secondary | ICD-10-CM | POA: Insufficient documentation

## 2022-11-22 DIAGNOSIS — Z7901 Long term (current) use of anticoagulants: Secondary | ICD-10-CM | POA: Diagnosis not present

## 2022-11-22 DIAGNOSIS — D649 Anemia, unspecified: Secondary | ICD-10-CM | POA: Diagnosis not present

## 2022-11-22 DIAGNOSIS — I132 Hypertensive heart and chronic kidney disease with heart failure and with stage 5 chronic kidney disease, or end stage renal disease: Secondary | ICD-10-CM | POA: Insufficient documentation

## 2022-11-22 DIAGNOSIS — Z8674 Personal history of sudden cardiac arrest: Secondary | ICD-10-CM | POA: Diagnosis not present

## 2022-11-22 DIAGNOSIS — N186 End stage renal disease: Secondary | ICD-10-CM

## 2022-11-22 DIAGNOSIS — R6521 Severe sepsis with septic shock: Secondary | ICD-10-CM | POA: Insufficient documentation

## 2022-11-22 DIAGNOSIS — Z01810 Encounter for preprocedural cardiovascular examination: Secondary | ICD-10-CM

## 2022-11-22 DIAGNOSIS — R7989 Other specified abnormal findings of blood chemistry: Secondary | ICD-10-CM | POA: Diagnosis not present

## 2022-11-22 DIAGNOSIS — D631 Anemia in chronic kidney disease: Secondary | ICD-10-CM

## 2022-11-22 DIAGNOSIS — I5022 Chronic systolic (congestive) heart failure: Secondary | ICD-10-CM

## 2022-11-22 DIAGNOSIS — E1122 Type 2 diabetes mellitus with diabetic chronic kidney disease: Secondary | ICD-10-CM | POA: Insufficient documentation

## 2022-11-22 DIAGNOSIS — D696 Thrombocytopenia, unspecified: Secondary | ICD-10-CM | POA: Diagnosis not present

## 2022-11-22 DIAGNOSIS — I4901 Ventricular fibrillation: Secondary | ICD-10-CM | POA: Insufficient documentation

## 2022-11-22 DIAGNOSIS — I2489 Other forms of acute ischemic heart disease: Secondary | ICD-10-CM | POA: Insufficient documentation

## 2022-11-22 DIAGNOSIS — Z992 Dependence on renal dialysis: Secondary | ICD-10-CM

## 2022-11-22 DIAGNOSIS — N179 Acute kidney failure, unspecified: Secondary | ICD-10-CM | POA: Diagnosis not present

## 2022-11-22 DIAGNOSIS — Z8711 Personal history of peptic ulcer disease: Secondary | ICD-10-CM | POA: Diagnosis not present

## 2022-11-22 DIAGNOSIS — E1169 Type 2 diabetes mellitus with other specified complication: Secondary | ICD-10-CM

## 2022-11-22 NOTE — Progress Notes (Signed)
ADVANCED HF CLINIC CONSULT NOTE  Primary Care: [facility] Cardiologist: Dr. Gala Romney  HPI: 72 y.o. male with history of CKD (baseline Scr 2.4), HTN, DM II, prostate cancer in remission, peptic ulcer disease w/ history of GI bleed, and new diagnosis of systolic heart failure. Follows with [facility] for most of his care.   Admitted 7/24 with septic vs CGS, AKI on CKD and severe metabolic acidosis. Creatinine 8.0, bicarb 10, LFTs elevated, lactic acid 6.8, hgb 10.4, platelets 74K, HS troponin [phone]. He required NE and ICU admission. Started on abx, no LHC due to CKD, felt HsTroponin d/t demand ischemia. He went into AF with RVR, requiring IV amiodarone, with subsequent increase in pressor requirements and worsening acidosis. Unfortunately suffered PEA arrest, achieved ROSC after 10 minutes, then developed Vfib and was defibrillated >> Afib with RVR, emergently intubated, required 3 pressors to maintain MAP. On meropenem for enterobacter cloacae bacteremia. Echo showed EF 20-25%, RV severely reduced, no significant valvular disease. He was started on CRRT, converted to NSR, and eventually extubated. Pressors weaned off and ultimately required [facility] placement, and transitioned to [facility]. Repeat ltd echo showed EF improved to 40-45%, RV normal. No GDMT given improved EF, and need to avoid hypotension. He was discharged home, weight 207 lbs, with plans for outpatient AVF.  Today he returns for post hospital HF follow up. Overall feeling fine. He is not SOB with walking on flat ground or getting around the house, no SOB pulling out trash cans. Denies palpitations, abnormal bleeding, CP, dizziness, edema, or PND/Orthopnea. Appetite ok. No fever or chills. Dry weight is 94 kgs. Taking all medications. Tolerating dialysis ok, no low BP.  Can not tolerate CPAP. Has been using alka-seltzer for constipation.  Cardiac Studies - Ltd Echo (7/24): EF 40-45%, moderate LVH, G1DD, normal RV  -  Echo (7/24): EF 20-25%, moderate concentric LVH, severely reduce RV  Review of Systems: [y] = yes, [ ]  = no   General: Weight gain [ ] ; Weight loss [ ] ; Anorexia [ ] ; Fatigue [ ] ; Fever [ ] ; Chills [ ] ; Weakness [ ]   Cardiac: Chest pain/pressure [ ] ; Resting SOB [ ] ; Exertional SOB [ ] ; Orthopnea [ ] ; Pedal Edema [ ] ; Palpitations [ ] ; Syncope [ ] ; Presyncope [ ] ; Paroxysmal nocturnal dyspnea[ ]   Pulmonary: Cough [ ] ; Wheezing[ ] ; Hemoptysis[ ] ; Sputum [ ] ; Snoring [place] ]  GI: Vomiting[ ] ; Dysphagia[ ] ; Melena[ ] ; Hematochezia [ ] ; Heartburn[ ] ; Abdominal pain [ ] ; Constipation [ ] ; Diarrhea [ ] ; BRBPR [ ]   GU: Hematuria[ ] ; Dysuria [ ] ; Nocturia[ ]   Vascular: Pain in legs with walking [ ] ; Pain in feet with lying flat [ ] ; Non-healing sores [ ] ; Stroke [ ] ; TIA [ ] ; Slurred speech [ ] ;  Neuro: Headaches[ ] ; Vertigo[ ] ; Seizures[ ] ; Paresthesias[ ] ;Blurred vision [ ] ; Diplopia [ ] ; Vision changes [ ]   Ortho/Skin: Arthritis [ ] ; Joint pain [ ] ; Muscle pain [ ] ; Joint swelling [ ] ; Back Pain [ ] ; Rash [ ]   Psych: Depression[ ] ; Anxiety[ ]   Heme: Bleeding problems [ ] ; Clotting disorders [ ] ; Anemia Cove.Etienne ]  Endocrine: Diabetes Cove.Etienne ]; Thyroid dysfunction[ ]   Past Medical History:  Diagnosis Date   Diabetes mellitus without complication (HCC)    GERD (gastroesophageal reflux disease)    Hypertension    Prostate cancer (HCC)    Current Outpatient Medications  Medication Sig Dispense Refill   allopurinol (ZYLOPRIM) 100 MG tablet Take 50  mg by mouth every other day. For high uric acid level     Alogliptin Benzoate 12.5 MG TABS Take 6.25 mg by mouth every morning. For diabetes (replaces metformin)     apixaban (ELIQUIS) 5 MG TABS tablet Take 1 tablet (5 mg total) by mouth 2 (two) times daily. 60 tablet 3   atorvastatin (LIPITOR) 20 MG tablet Take 20 mg by mouth at bedtime. For cholesterol     No current facility-administered medications for this encounter.   Allergies  Allergen Reactions    Ferrous Sulfate Other (See Comments)   Penicillins Hives and Other (See Comments)    Has patient had a PCN reaction causing immediate rash, facial/tongue/throat swelling, SOB or lightheadedness with hypotension: no Has patient had a PCN reaction causing severe rash involving mucus membranes or skin necrosis: no Has patient had a PCN reaction that required hospitalization: yes Has patient had a PCN reaction occurring within the last 10 years: no If all of the above answers are "NO", then may proceed with Cephalosporin use.   Social History   Socioeconomic History   Marital status: Single    Spouse name: Not on file   Number of children: Not on file   Years of education: Not on file   Highest education level: Not on file  Occupational History   Not on file  Tobacco Use   Smoking status: Never    Passive exposure: Never   Smokeless tobacco: Never  Vaping Use   Vaping status: Never Used  Substance and Sexual Activity   Alcohol use: No   Drug use: No   Sexual activity: Not on file  Other Topics Concern   Not on file  Social History Narrative   Not on file   Social Determinants of Health   Financial Resource Strain: Low Risk  (10/27/2022)   Overall Financial Resource Strain (CARDIA)    Difficulty of Paying Living Expenses: Not hard at all  Food Insecurity: No Food Insecurity (10/27/2022)   Hunger Vital Sign    Worried About Running Out of Food in the Last Year: Never true    Ran Out of Food in the Last Year: Never true  Transportation Needs: No Transportation Needs (10/27/2022)   PRAPARE - Administrator, Civil Service (Medical): No    Lack of Transportation (Non-Medical): No  Physical Activity: Not on file  Stress: Not on file  Social Connections: Not on file  Intimate Partner Violence: Not At Risk (10/27/2022)   Humiliation, Afraid, Rape, and Kick questionnaire    Fear of Current or Ex-Partner: No    Emotionally Abused: No    Physically Abused: No    Sexually  Abused: No    No family history on file.  BP (!) 140/72   Pulse 78   Wt 94.8 kg (209 lb)   SpO2 99%   BMI 31.78 kg/m   Wt Readings from Last 3 Encounters:  11/22/22 94.8 kg (209 lb)  11/09/22 98.5 kg (217 lb 2.5 oz)  11/13/19 95.3 kg (210 lb)   PHYSICAL EXAM: General:  NAD. No resp difficulty, walked into clinic HEENT: Normal Neck: Supple. No JVD. Carotids 2+ bilat; no bruits. No lymphadenopathy or thryomegaly appreciated. Cor: PMI nondisplaced. Regular rate & rhythm. No rubs, gallops or murmurs. Lungs: Clear, TDC looks OK Abdomen: Soft, nontender, nondistended. No hepatosplenomegaly. No bruits or masses. Good bowel sounds. Extremities: No cyanosis, clubbing, rash, edema Neuro: Alert & oriented x 3, cranial nerves grossly intact. Moves all 4  extremities w/o difficulty. Affect pleasant.  ECG (personally reviewed): NSR with PAC  ReDs: 38%  ASSESSMENT & PLAN 1. Chronic biventricular systolic CHF: - Recent admission for shock, likely septic + possible GCS. BC + for Enterobacter cloacae. - Suspect component of demand ischemia Type II NSTEMI, not candidate for cath with ESRD. - Echo (7/24): EF 20-25%, RV severely reduced, no significant valvular disease - Repeat ltd echo (7/24) EF improved 40-45%, RV ok  - NYHA II, volume managed by dialysis. GDMT limited by ESRD  - Avoid hypotension - Consider afterload reduction, will hold off until after AVF placement - If EF does not continue to improve, could consider LHC now that he is on iHD. - Repeat echo in 3 months   2. H/o Cardiac arrest: -  10/25/22-Became bradycardic and had PEA arrest, CPR with ROSC after 10 minutes.  - Then had Vfib s/p defibrillation >>Afib. - He does not have an ICD.   3. H/o Elevated troponin -> Type II NSTEMI - Peaked at 2,879, flat trend - Suspect demand ischemia as above.  - No chest pain.    4. ESRD - Scr baseline 2.4, peaked at 8.7. AKI in setting of shock. - CRRT stopped 7/27, transitioned to  iHD. Nonoliguric.   - now s/p [facility] 11/04/22, planning AVF (see below) - iHD MWF - Tolerating dialysis ok.   5. Atrial fibrillation  - New - Failed IV amiodarone due to hypotension - Converted to SR on 10/28/22.  - NSR on ECG today - Continue Eliquis, no bleeding issues - Unable to tolerate CPAP   6. DM II - A1c 7.5% - He is not on insulin.   7. Anemia History of iron deficiency - Renal disease likely contributory. - T sat 7%, consider IV iron    8. Thrombocytopenia - D/t septic shock - Platelets down to 35K, improved to 244k on most recent labs  9. Cardiac risk stratification - Moderate risk for AVF graft due to recent cardiac history. Recent ltd echo showed EF improved to 40-45% - OK for AVF, discussed with Dr. Gala Romney - Hold Eliquis x 3 days  Follow up in 4-6 weeks with PharmD for med titration (add BiDil if able), and 3 months with Dr. Gala Romney + echo  [place], FNP-BC 11/22/22

## 2022-11-22 NOTE — Progress Notes (Signed)
ReDS Vest / Clip - 11/22/22 0900       ReDS Vest / Clip   Station Marker D    Ruler Value 37    ReDS Value Range Moderate volume overload    ReDS Actual Value 38

## 2022-11-22 NOTE — Patient Instructions (Signed)
Medication Changes:  STOP TAKING ALKA SELTZER   OKAY TO TAKE MIRALAX OVER THE COUNTER FOR CONSTIPATION   Testing/Procedures:  Your physician has requested that you have an echocardiogram IN 3 MONTHS AS SCHEDULED. Echocardiography is a painless test that uses sound waves to create images of your heart. It provides your doctor with information about the size and shape of your heart and how well your heart's chambers and valves are working. This procedure takes approximately one hour. There are no restrictions for this procedure. Please do NOT wear cologne, perfume, aftershave, or lotions (deodorant is allowed). Please arrive 15 minutes prior to your appointment time.  Follow-Up in: 4-6 WEEKS WITH PHARMD AS SCHEDULED   THEN RETURN IN 3 MONTHS AS SCHEDULED TO SEE DR. Gala Romney   At the [facility], you and your health needs are our priority. We have a designated team specialized in the treatment of Heart Failure. This Care Team includes your primary Heart Failure Specialized Cardiologist (physician), Advanced Practice Providers (APPs- Physician Assistants and Nurse Practitioners), and Pharmacist who all work together to provide you with the care you need, when you need it.   You may see any of the following providers on your designated Care Team at your next follow up:  Dr. Arvilla Meres Dr. Marca Ancona Dr. Marcos Eke, NP Robbie Lis, [place] [facility] [place], [place] Brynda Peon, NP Karle Plumber, PharmD  Please be sure to bring in all your medications bottles to every appointment.   Need to Contact us:  If you have any questions or concerns before your next appointment please send [place] a message through [place] or call our office at [phone].    TO LEAVE A MESSAGE FOR THE NURSE SELECT OPTION 2, PLEASE LEAVE A MESSAGE INCLUDING: YOUR NAME DATE OF BIRTH CALL BACK NUMBER REASON FOR CALL**this is important as we prioritize the call  backs  YOU WILL RECEIVE A CALL BACK THE SAME DAY AS LONG AS YOU CALL BEFORE 4:00 PM

## 2022-12-01 ENCOUNTER — Telehealth: Payer: Self-pay

## 2022-12-01 NOTE — Telephone Encounter (Signed)
Attempted to reach patient to schedule left AV fistula vs AV graft. Left VM for patient to return call.

## 2022-12-07 ENCOUNTER — Ambulatory Visit: Payer: No Typology Code available for payment source | Admitting: General Practice

## 2022-12-15 ENCOUNTER — Other Ambulatory Visit (HOSPITAL_COMMUNITY): Payer: Self-pay

## 2022-12-15 NOTE — Progress Notes (Signed)
Advanced Heart Failure Clinic Note   Primary Care: [facility] Cardiologist: Dr. Gala Romney  HPI:  72 y.o. male with history of ESRD on iHD MWF, HTN, DM II, prostate cancer in remission, peptic ulcer disease w/ history of GI bleed, and new diagnosis of systolic heart failure. Follows with [facility] for most of his care.   Admitted 10/2022 with septic vs CGS, AKI on CKD and severe metabolic acidosis. Creatinine 8.0, bicarb 10, LFTs elevated, lactic acid 6.8, hgb 10.4, platelets 74K, HS troponin [phone]. He required NE and ICU admission. Started on abx, no LHC due to CKD, felt HsTroponin d/t demand ischemia. He went into AF with RVR, requiring IV amiodarone, with subsequent increase in pressor requirements and worsening acidosis. Unfortunately suffered PEA arrest, achieved ROSC after 10 minutes, then developed Vfib and was defibrillated >> Afib with RVR, emergently intubated, required 3 pressors to maintain MAP. On meropenem for enterobacter cloacae bacteremia. Echo showed EF 20-25%, RV severely reduced, no significant valvular disease. He was started on CRRT, converted to NSR, and eventually extubated. Pressors weaned off and ultimately required [facility] placement, and transitioned to [facility]. Repeat ltd echo showed EF improved to 40-45%, RV normal. No GDMT given improved EF, and need to avoid hypotension. He was discharged home, weight 207 lbs, with plans for outpatient AVF.   Presented to AHF Clnic for post hospital HF follow up 11/22/22. Overall was feeling fine. He was not SOB with walking on flat ground or getting around the house, no SOB pulling out trash cans. Denied palpitations, abnormal bleeding, CP, dizziness, edema, or PND/Orthopnea. Appetite was ok. No fever or chills. Dry weight was 94 kgs. Reported taking all medications. Was tolerating dialysis ok, no low BP.  Could not tolerate CPAP. Had been using alka-seltzer for constipation.  Today he returns to HF clinic for  pharmacist medication titration. At last visit with APP, no medication changes were made. However, alogliptin was recently changed to sitaglitin by the [place]. Additionally, amlodipine 5 mg daily was started by his dialysis technician. However, he has not started this yet because he didn't want to start any medication that was not approved by his cardiologist. He also was upset with the care provided at the dialysis center as they frequently did not explain the medications they were giving him or the rationale behind them. He also complained that they frequently told them they had not reviewed his records before making medication changes. This occurred when the physician wanted him to start amlodipine and also occurred when the nurse went to administer IV iron and he asked her to stop as iron is on his allergy list. He has not been happy with the care he received there and plans to transition to the [place] managing his dialysis. His BP at home is typically 140-144/80s. BP in dialysis has been in the 180s. BP in clinic today was 180/86. He attributed this to stress/anxiety after detailing the difficulties he has had with dialysis. His BP was 140/72 at his last office visit here. No dizziness or lightheadedness. No CP or palpitations. No SOB/DOE. Weight has been stable, volume managed by iHD. No LEE, PND or orthopnea. He does note a new tremor in his hands which he attributes to low blood sugar. He shared his fasting BG values, most in the 90s, with one value of 70. He plans to speak with his PCP at the [place] about this.    Of note, patient does not wish to go back  to his current dialysis center. [facility]. He does need a dialysis session tomorrow. [facility] told him to contact his provider to have dialysis orders sent locally or to go to the ED. Discussed with APP today, our office cannot provide dialysis orders. He was instructed to contact his nephrologist to provide any  orders or call [facility] back to see if they can start his care back earlier.    Has the patient been experiencing any side effects to the medications prescribed?  no  Does the patient have any problems obtaining medications due to transportation or finances?  [employer] Part D plan. Also managed by the [place].   Understanding of regimen: good Understanding of indications: good Potential of compliance: good Patient understands to avoid NSAIDs. Patient understands to avoid decongestants.    Pertinent Lab Values: 11/09/22: Serum creatinine 4.39, BUN 37, Potassium 3.9, Sodium 135  Vital Signs: Weight: 202.6 lbs (last clinic weight: 209 lbs) Blood pressure: 180/88, only decreased to 176/86 after waiting 15 minutes. Per patient, BP 144/88 at home and likely increased due to stress/anxiety during clinic related to discussing issues in dialysis.   Heart rate: 77   Assessment/Plan: 1. Chronic biventricular systolic CHF: - Recent admission for shock, likely septic + possible GCS. BC + for Enterobacter cloacae. - Suspect component of demand ischemia Type II NSTEMI, not candidate for cath with ESRD. - Echo (10/2022): EF 20-25%, RV severely reduced, no significant valvular disease - Repeat ltd echo (10/2022) EF improved 40-45%, RV ok  - NYHA II, volume managed by dialysis. GDMT limited by ESRD - Was planning to start Bidil today. However, another physician recently prescribed amlodipine 5 mg daily so will start this for now to prevent confusion. Plan to start Bidil in the future if BP allows. We discussed Bidil today in detail and patient is amenable to starting at a later date. Of note, he tolerated hydralazine up to 100 mg TID in the past.  - Avoid hypotension - If EF does not continue to improve, could consider LHC now that he is on iHD. - Repeat echo in 3 months   2. H/o Cardiac arrest: -  10/25/22-Became bradycardic and had PEA arrest, CPR with ROSC after 10 minutes.  - Then had Vfib s/p defibrillation  >>Afib. - He does not have an ICD.   3. H/o Elevated troponin -> Type II NSTEMI - Peaked at 2,879, flat trend - Suspect demand ischemia as above.  - No chest pain.    4. ESRD - Scr baseline 2.4, peaked at 8.7. AKI in setting of shock. - CRRT stopped 10/29/22, transitioned to iHD. Nonoliguric.   - now s/p [facility] 11/04/22, planning AVF  - iHD MWF - Tolerating dialysis ok however he is unsatisfied with his current care. It appears the [place] will start managing his dialysis per patient report.    5. Atrial fibrillation  - New - Failed IV amiodarone due to hypotension - Converted to SR on 10/28/22.  - NSR on ECG 11/28/22 - Continue Eliquis, no bleeding issues - Unable to tolerate CPAP  6. Hypertension - Blood pressure in clinic today 180/88, only decreased to 176/86 after waiting 15 minutes. Per patient, BP 144/80s at home and likely increased due to stress/anxiety during clinic related to discussing issues in dialysis.   -Will start amlodipine 5 mg daily today. Recently prescribed by a physician at dialysis but he did not want to start until confirming  with [place] it was safe to take.    7. DM II - A1c 7.5% - He is not on insulin.   8. Anemia History of iron deficiency - Renal disease likely contributory. - T sat 7%, consider IV iron    9. Thrombocytopenia - D/t septic shock - Platelets down to 35K, improved to 244k on most recent labs   10. Cardiac risk stratification - Moderate risk for AVF graft due to recent cardiac history. Recent ltd echo showed EF improved to 40-45% - OK for AVF, discussed with Dr. Gala Romney - Hold Eliquis x 3 days  Follow up 4 weeks with [facility]   Karle Plumber, PharmD, BCPS, BCCP, CPP Heart Failure Clinic Pharmacist [phone]

## 2022-12-19 NOTE — Progress Notes (Unsigned)
Cardiology Clinic Note   Patient Name: Troy Johnston Date of Encounter: 12/20/2022  Primary Care Provider:  [facility] Primary Cardiologist:  Maisie Fus, MD  Patient Profile    Troy Johnston 72 year old male presents the clinic today for follow-up evaluation of his chronic systolic CHF and paroxysmal atrial fibrillation.  Past Medical History    Past Medical History:  Diagnosis Date   Diabetes mellitus without complication (HCC)    GERD (gastroesophageal reflux disease)    Hypertension    Prostate cancer (HCC)    Past Surgical History:  Procedure Laterality Date   IR FLUORO GUIDE CV LINE RIGHT  11/04/2022   IR US GUIDE VASC ACCESS RIGHT  11/04/2022    Allergies  Allergies  Allergen Reactions   Ferrous Sulfate Other (See Comments)   Penicillins Hives and Other (See Comments)    Has patient had a PCN reaction causing immediate rash, facial/tongue/throat swelling, SOB or lightheadedness with hypotension: no Has patient had a PCN reaction causing severe rash involving mucus membranes or skin necrosis: no Has patient had a PCN reaction that required hospitalization: yes Has patient had a PCN reaction occurring within the last 10 years: no If all of the above answers are "NO", then may proceed with Cephalosporin use.    History of Present Illness    Troy Johnston has a PMH of chronic systolic CHF, cardiac arrest (sinus bradycardia and PEA, then V-fib arrest status post defibrillation to atrial fibrillation), NSTEMI, ESRD (HD Monday Wednesday Friday), atrial fibrillation, thrombocytopenia, and septic shock.  He was admitted 7/24 with sepsis versus cardiogenic shock, acute on chronic CKD, and metabolic acidosis.  He required ICU admission and received antibiotics.  His troponins were noted to be elevated however, it was felt that his troponins were elevated due to demand ischemia and no cardiac catheterization was performed.  He required IV amiodarone  after going into atrial fibrillation with RVR.  He suffered PEA arrest, then was noted to have ventricular fibrillation and received defibrillation which resulted in A-fib RVR.  He required intubation and vasopressor support.  His echocardiogram showed an LVEF of 20-25%.  He was started on CRRT.  He converted to sinus rhythm.  He received placement of temporary dialysis catheter and was transition to inpatient hemodialysis.  His follow-up echocardiogram showed improved EF of 40-45%.  His discharge weight was noted to be 270 pounds.  He was seen in follow-up by the advanced heart failure clinic.  During that time he reported feeling fine.  He denied shortness of breath with walking on flat ground.  He denied shortness of breath with increased physical activity such as moving trash cans.  He denied chest pain and lower extremity swelling.  He reported taking his medications as prescribed.  He was tolerating hemodialysis well.  He was not able to tolerate CPAP.  He was felt to be moderate risk for AV fistula graft placement.  Plan for holding Eliquis x 3 days was made.  He presents to the clinic today for follow-up evaluation and states he is frustrated with dialysis.  He reports that his father also had renal disease.  We reviewed his hospitalization and he expressed understanding.  We reviewed his cardiac medications and echocardiogram.  He continues to be very physically active and has been watching his diet closely.  I will give him a weight log, continue his current medication regimen, give diet instructions and plan follow-up in 4 months..  Today he denies chest  pain, shortness of breath, lower extremity edema, fatigue, palpitations, melena, hematuria, hemoptysis, diaphoresis, weakness, presyncope, syncope, orthopnea, and PND.   Home Medications    Prior to Admission medications   Medication Sig Start Date End Date Taking? Authorizing Provider  allopurinol (ZYLOPRIM) 100 MG tablet Take 50 mg by mouth  every other day. For high uric acid level    [provider]  Alogliptin Benzoate 12.5 MG TABS Take 6.25 mg by mouth every morning. For diabetes (replaces metformin) 11/09/22   Ghimire, Werner Lean, MD  apixaban (ELIQUIS) 5 MG TABS tablet Take 1 tablet (5 mg total) by mouth 2 (two) times daily. 11/09/22   Ghimire, Werner Lean, MD  atorvastatin (LIPITOR) 20 MG tablet Take 20 mg by mouth at bedtime. For cholesterol    [provider]    Family History    History reviewed. No pertinent family history. has no family status information on file.   Social History    Social History   Socioeconomic History   Marital status: Single    Spouse name: Not on file   Number of children: Not on file   Years of education: Not on file   Highest education level: Not on file  Occupational History   Not on file  Tobacco Use   Smoking status: Never    Passive exposure: Never   Smokeless tobacco: Never  Vaping Use   Vaping status: Never Used  Substance and Sexual Activity   Alcohol use: No   Drug use: No   Sexual activity: Not on file  Other Topics Concern   Not on file  Social History Narrative   Not on file   Social Determinants of Health   Financial Resource Strain: Low Risk  (10/27/2022)   Overall Financial Resource Strain (CARDIA)    Difficulty of Paying Living Expenses: Not hard at all  Food Insecurity: No Food Insecurity (10/27/2022)   Hunger Vital Sign    Worried About Running Out of Food in the Last Year: Never true    Ran Out of Food in the Last Year: Never true  Transportation Needs: No Transportation Needs (10/27/2022)   PRAPARE - Administrator, Civil Service (Medical): No    Lack of Transportation (Non-Medical): No  Physical Activity: Not on file  Stress: Not on file  Social Connections: Not on file  Intimate Partner Violence: Not At Risk (10/27/2022)   Humiliation, Afraid, Rape, and Kick questionnaire    Fear of Current or Ex-Partner: No    Emotionally  Abused: No    Physically Abused: No    Sexually Abused: No     Review of Systems    General:  No chills, fever, night sweats or weight changes.  Cardiovascular:  No chest pain, dyspnea on exertion, edema, orthopnea, palpitations, paroxysmal nocturnal dyspnea. Dermatological: No rash, lesions/masses Respiratory: No cough, dyspnea Urologic: No hematuria, dysuria Abdominal:   No nausea, vomiting, diarrhea, bright red blood per rectum, melena, or hematemesis Neurologic:  No visual changes, wkns, changes in mental status. All other systems reviewed and are otherwise negative except as noted above.  Physical Exam    VS:  BP 120/70 (BP Location: Left Arm, Patient Position: Sitting, Cuff Size: Normal)   Pulse 88   Ht 5\' 8"  (1.727 m)   Wt 205 lb (93 kg)   SpO2 98%   BMI 31.17 kg/m  , BMI Body mass index is 31.17 kg/m. GEN: Well nourished, well developed, in no acute distress. HEENT: normal. Neck:  Supple, no JVD, carotid bruits, or masses. Cardiac: RRR, no murmurs, rubs, or gallops. No clubbing, cyanosis, edema.  Radials/DP/PT 2+ and equal bilaterally.  Respiratory:  Respirations regular and unlabored, clear to auscultation bilaterally. GI: Soft, nontender, nondistended, BS + x 4. MS: no deformity or atrophy. Skin: warm and dry, no rash. Neuro:  Strength and sensation are intact. Psych: Normal affect.  Accessory Clinical Findings    Recent Labs: 10/25/2022: B Natriuretic Peptide 719.5 10/31/2022: Magnesium 2.4 11/03/2022: ALT 98 11/09/2022: BUN 37; Creatinine, Ser 4.39; Hemoglobin 7.9; Platelets 244; Potassium 3.9; Sodium 135   Recent Lipid Panel    Component Value Date/Time   CHOL  06/18/2007 0858    95        ATP III CLASSIFICATION:  <200     mg/dL   Desirable  737-106  mg/dL   Borderline High  >=269    mg/dL   High   TRIG 74 48/54/6270 0858   HDL 22 (L) 06/18/2007 0858   CHOLHDL 4.3 06/18/2007 0858   VLDL 15 06/18/2007 0858   LDLCALC  06/18/2007 0858    58        Total  Cholesterol/HDL:CHD Risk Coronary Heart Disease Risk Table                     Men   Women  1/2 Average Risk   3.4   3.3         ECG personally reviewed by me today-none today.     Echocardiogram 10/31/2022  IMPRESSIONS     1. Left ventricular ejection fraction, by estimation, is 40 to 45%. The  left ventricle has mildly decreased function. The left ventricle has no  regional wall motion abnormalities. There is moderate concentric left  ventricular hypertrophy. Left  ventricular diastolic parameters are consistent with Grade I diastolic  dysfunction (impaired relaxation).   2. Right ventricular systolic function is normal. The right ventricular  size is normal.   3. The mitral valve is normal in structure. No evidence of mitral valve  regurgitation. No evidence of mitral stenosis.   4. The aortic valve is normal in structure. There is mild calcification  of the aortic valve. Aortic valve regurgitation is not visualized. No  aortic stenosis is present.   5. The inferior vena cava is normal in size with greater than 50%  respiratory variability, suggesting right atrial pressure of 3 mmHg.   FINDINGS   Left Ventricle: Left ventricular ejection fraction, by estimation, is 40  to 45%. The left ventricle has mildly decreased function. The left  ventricle has no regional wall motion abnormalities. Definity contrast  agent was given IV to delineate the left  ventricular endocardial borders. The left ventricular internal cavity size  was normal in size. There is moderate concentric left ventricular  hypertrophy. Left ventricular diastolic parameters are consistent with  Grade I diastolic dysfunction (impaired  relaxation).   Right Ventricle: The right ventricular size is normal. No increase in  right ventricular wall thickness. Right ventricular systolic function is  normal.   Left Atrium: Left atrial size was normal in size.   Right Atrium: Right atrial size was normal in  size.   Pericardium: There is no evidence of pericardial effusion.   Mitral Valve: The mitral valve is normal in structure. No evidence of  mitral valve stenosis.   Tricuspid Valve: The tricuspid valve is normal in structure. Tricuspid  valve regurgitation is not demonstrated. No evidence of tricuspid  stenosis.  Aortic Valve: The aortic valve is normal in structure. There is mild  calcification of the aortic valve. There is mild aortic valve annular  calcification. Aortic valve regurgitation is not visualized. No aortic  stenosis is present.   Pulmonic Valve: The pulmonic valve was normal in structure. Pulmonic valve  regurgitation is not visualized. No evidence of pulmonic stenosis.   Aorta: The aortic root is normal in size and structure.   Venous: The inferior vena cava is normal in size with greater than 50%  respiratory variability, suggesting right atrial pressure of 3 mmHg.   IAS/Shunts: No atrial level shunt detected by color flow Doppler.       Assessment & Plan   1. Elevated troponin,  NSTEMI-no chest pain today.  During recent admission 7/24 he was noted to have troponins that peaked at 2879.  It was felt that his troponins were elevated due to demand ischemia.  Sustained cardiac arrest 10/25/2022.  He was noted to be bradycardic and had PEA arrest.  He received 10 minutes of CPR and ROSC was noted after 10 minutes.  He was noted to have ventricular fibrillation and underwent defibrillation to atrial fibrillation.  He later converted to sinus rhythm on 10/28/2022. No plans for ischemic evaluation Heart healthy low-sodium diet Increase physical activity as tolerated  Chronic systolic CHF-euvolemic.  Weight today 205.  Increase in physical activity.  Initial EF noted to be 20-25% with no significant valvular abnormalities.  RV was noted to be severely reduced.  Repeat echo 7/24 showed improved EF 40-45%. Following with advanced heart failure clinic with plan for repeat  echocardiogram around 11/24.  Atrial fibrillation-heart rate today 88.  Did not tolerate amiodarone therapy due to decreased blood pressure.  Denies bleeding issues.  Reports compliance with apixaban.  Not able to tolerate CPAP. Avoid triggers caffeine, chocolate, EtOH, dehydration etc.  End-stage renal disease-preparing for AV fistula placement.  HD Monday Wednesday Friday.  Tolerating well. Follows with nephrology  Disposition: Follow-up with Dr. Wyline Mood or me in 4 months.   Thomasene Ripple. Taesean Reth NP-C     12/20/2022, 3:32 PM [facility] [address] Office [phone] Fax [phone]    I spent 16 minutes examining this patient, reviewing medications, and using patient centered shared decision making involving her cardiac care.  Prior to her visit I spent greater than 20 minutes reviewing her past medical history,  medications, and prior cardiac tests.

## 2022-12-20 ENCOUNTER — Encounter: Payer: Self-pay | Admitting: General Practice

## 2022-12-20 ENCOUNTER — Ambulatory Visit: Payer: No Typology Code available for payment source | Attending: General Practice | Admitting: General Practice

## 2022-12-20 VITALS — BP 120/70 | HR 88 | Ht 68.0 in | Wt 205.0 lb

## 2022-12-20 DIAGNOSIS — N186 End stage renal disease: Secondary | ICD-10-CM

## 2022-12-20 DIAGNOSIS — R7989 Other specified abnormal findings of blood chemistry: Secondary | ICD-10-CM

## 2022-12-20 DIAGNOSIS — I5022 Chronic systolic (congestive) heart failure: Secondary | ICD-10-CM

## 2022-12-20 DIAGNOSIS — I480 Paroxysmal atrial fibrillation: Secondary | ICD-10-CM | POA: Diagnosis not present

## 2022-12-20 NOTE — Patient Instructions (Addendum)
Medication Instructions:  The current medical regimen is effective;  continue present plan and medications as directed. Please refer to the Current Medication list given to you today.  *If you need a refill on your cardiac medications before your next appointment, please call your pharmacy*  Lab Work: NONE If you have labs (blood work) drawn today and your tests are completely normal, you will receive your results only by:  MyChart Message (if you have MyChart) OR  A paper copy in the mail If you have any lab test that is abnormal or we need to change your treatment, we will call you to review the results.  Other Instructions CONTINUE WEIGHT LOG AND DIALYSIS DIET  Follow-Up: At [facility], you and your health needs are our priority.  As part of our continuing mission to provide you with exceptional heart care, we have created designated Provider Care Teams.  These Care Teams include your primary Cardiologist (physician) and Advanced Practice Providers (APPs -  Physician Assistants and Nurse Practitioners) who all work together to provide you with the care you need, when you need it.  Your next appointment:   4 month(s)  Provider:   Maisie Fus, MD

## 2022-12-29 ENCOUNTER — Ambulatory Visit (HOSPITAL_COMMUNITY)
Admission: RE | Admit: 2022-12-29 | Discharge: 2022-12-29 | Disposition: A | Discharge status: Home / Self Care | Payer: No Typology Code available for payment source | Source: Ambulatory Visit | Attending: Cardiology | Admitting: Cardiology

## 2022-12-29 VITALS — BP 176/86 | HR 77 | Wt 202.6 lb

## 2022-12-29 DIAGNOSIS — I252 Old myocardial infarction: Secondary | ICD-10-CM | POA: Insufficient documentation

## 2022-12-29 DIAGNOSIS — Z7901 Long term (current) use of anticoagulants: Secondary | ICD-10-CM | POA: Insufficient documentation

## 2022-12-29 DIAGNOSIS — E1122 Type 2 diabetes mellitus with diabetic chronic kidney disease: Secondary | ICD-10-CM | POA: Diagnosis not present

## 2022-12-29 DIAGNOSIS — I5022 Chronic systolic (congestive) heart failure: Secondary | ICD-10-CM | POA: Diagnosis not present

## 2022-12-29 DIAGNOSIS — Z8674 Personal history of sudden cardiac arrest: Secondary | ICD-10-CM | POA: Diagnosis not present

## 2022-12-29 DIAGNOSIS — D631 Anemia in chronic kidney disease: Secondary | ICD-10-CM | POA: Diagnosis not present

## 2022-12-29 DIAGNOSIS — Z8546 Personal history of malignant neoplasm of prostate: Secondary | ICD-10-CM | POA: Insufficient documentation

## 2022-12-29 DIAGNOSIS — I132 Hypertensive heart and chronic kidney disease with heart failure and with stage 5 chronic kidney disease, or end stage renal disease: Secondary | ICD-10-CM | POA: Insufficient documentation

## 2022-12-29 DIAGNOSIS — Z992 Dependence on renal dialysis: Secondary | ICD-10-CM | POA: Insufficient documentation

## 2022-12-29 DIAGNOSIS — I4891 Unspecified atrial fibrillation: Secondary | ICD-10-CM | POA: Diagnosis not present

## 2022-12-29 DIAGNOSIS — D696 Thrombocytopenia, unspecified: Secondary | ICD-10-CM | POA: Insufficient documentation

## 2022-12-29 DIAGNOSIS — I5082 Biventricular heart failure: Secondary | ICD-10-CM | POA: Diagnosis not present

## 2022-12-29 DIAGNOSIS — N186 End stage renal disease: Secondary | ICD-10-CM | POA: Insufficient documentation

## 2022-12-29 DIAGNOSIS — C61 Malignant neoplasm of prostate: Secondary | ICD-10-CM | POA: Diagnosis present

## 2022-12-29 DIAGNOSIS — I5020 Unspecified systolic (congestive) heart failure: Secondary | ICD-10-CM | POA: Diagnosis present

## 2022-12-29 MED ORDER — AMLODIPINE BESYLATE 5 MG PO TABS: 5.0000 mg | ORAL_TABLET | Freq: Every day | ORAL | 3 refills | Status: DC

## 2022-12-29 NOTE — Patient Instructions (Signed)
It was a pleasure seeing you today!  MEDICATIONS: -We are changing your medications today -Start amlodipine 5 mg (1 tablet) daily. You may have to ask the pharmacy to process the prescription if they have already returned it to stock.  -Call if you have questions about your medications.   NEXT APPOINTMENT: Return to clinic in 4 weeks with [facility].  In general, to take care of your heart failure: -Limit your fluid intake to 2 Liters (half-gallon) per day.   -Limit your salt intake to ideally 2-3 grams (2000-3000 mg) per day. -Weigh yourself daily and record, and bring that "weight diary" to your next appointment.  (Weight gain of 2-3 pounds in 1 day typically means fluid weight.) -The medications for your heart are to help your heart and help you live longer.   -Please contact us before stopping any of your heart medications.  Call the clinic at [phone] with questions or to reschedule future appointments.

## 2023-01-09 ENCOUNTER — Emergency Department (HOSPITAL_COMMUNITY)
Admission: EM | Admit: 2023-01-09 | Discharge: 2023-01-09 | Discharge status: Against Medical Advice | Payer: No Typology Code available for payment source | Attending: Emergency Medicine | Admitting: Emergency Medicine

## 2023-01-09 ENCOUNTER — Encounter (HOSPITAL_COMMUNITY): Payer: Self-pay

## 2023-01-09 ENCOUNTER — Other Ambulatory Visit: Payer: Self-pay

## 2023-01-09 DIAGNOSIS — Z4931 Encounter for adequacy testing for hemodialysis: Secondary | ICD-10-CM | POA: Diagnosis present

## 2023-01-09 DIAGNOSIS — Z5321 Procedure and treatment not carried out due to patient leaving prior to being seen by health care provider: Secondary | ICD-10-CM | POA: Diagnosis not present

## 2023-01-09 DIAGNOSIS — N186 End stage renal disease: Secondary | ICD-10-CM | POA: Insufficient documentation

## 2023-01-09 LAB — CBC
HCT: 41.1 % (ref 39.0–52.0)
Hemoglobin: 12.4 g/dL — ABNORMAL LOW (ref 13.0–17.0)
MCH: 27.7 pg (ref 26.0–34.0)
MCHC: 30.2 g/dL (ref 30.0–36.0)
MCV: 91.9 fL (ref 80.0–100.0)
Platelets: 167 10*3/uL (ref 150–400)
RBC: 4.47 MIL/uL (ref 4.22–5.81)
RDW: 14.3 % (ref 11.5–15.5)
WBC: 5.3 10*3/uL (ref 4.0–10.5)
nRBC: 0 % (ref 0.0–0.2)

## 2023-01-09 LAB — BASIC METABOLIC PANEL
Anion gap: 9 (ref 5–15)
BUN: 41 mg/dL — ABNORMAL HIGH (ref 8–23)
CO2: 23 mmol/L (ref 22–32)
Calcium: 9.4 mg/dL (ref 8.9–10.3)
Chloride: 107 mmol/L (ref 98–111)
Creatinine, Ser: 2.33 mg/dL — ABNORMAL HIGH (ref 0.61–1.24)
GFR, Estimated: 29 mL/min — ABNORMAL LOW (ref >60)
Glucose, Bld: 115 mg/dL — ABNORMAL HIGH (ref 70–99)
Potassium: 4.5 mmol/L (ref 3.5–5.1)
Sodium: 139 mmol/L (ref 135–145)

## 2023-01-09 NOTE — ED Triage Notes (Signed)
Pt came in via POV requesting dialysis since he has not been dialyzed since December 26, 2022. A/Ox4, denies pain or any SOB/CP. States he quit d/t his port burning the last time he went for a Tx.

## 2023-01-09 NOTE — ED Notes (Signed)
Pt left without being seen.

## 2023-01-09 NOTE — ED Provider Triage Note (Signed)
Emergency Medicine Provider Triage Evaluation Note  LOTHAR PREHN , a 72 y.o. male  was evaluated in triage.  Pt states that he has not had dialysis since 9/27 and needs it per the [place].  Review of Systems  Positive: Needs dialysis Negative: CP, SOB, weakness  Physical Exam  BP (!) 152/91 (BP Location: Left Arm)   Pulse 88   Temp 98.7 F (37.1 C) (Oral)   Resp 17   Ht 5\' 7"  (1.702 m)   Wt 93 kg   SpO2 100%   BMI 32.11 kg/m  Gen:   Awake, no distress   Resp:  Normal effort  MSK:   Moves extremities without difficulty  Other:    Medical Decision Making  Medically screening exam initiated at 4:07 PM.  Appropriate orders placed.  Guy Sandifer Riden was informed that the remainder of the evaluation will be completed by another provider, this initial triage assessment does not replace that evaluation, and the importance of remaining in the ED until their evaluation is complete.   Maxwell Marion, PA-C 01/09/23 [phone]

## 2023-01-16 ENCOUNTER — Telehealth (HOSPITAL_COMMUNITY): Payer: Self-pay | Admitting: Licensed Clinical Social Worker

## 2023-01-16 NOTE — Telephone Encounter (Signed)
CSW received call from pt requesting help scheduling to have his dialysis port removed.  Explained that he had called the [facility] but I would attempt to direct him.  Pt gets dialysis at the [facility] and they were going to work with him on this but he got the fistula placed at [facility] so would like the same team to take it out.  Appears as if pt had fistula placed during recent hospital stay by [facility] team so CSW suggested he start by calling them to inquire.  Burna Sis, LCSW Clinical Social Worker [facility] Desk#: [phone] Cell#: [phone]

## 2023-01-17 NOTE — Telephone Encounter (Signed)
Contacted patient to follow up on attempt to schedule surgery for dialysis access. Patient states that he is no longer on dialysis and does not need surgery. [facility] that was placed by [facility].

## 2023-01-26 ENCOUNTER — Encounter (HOSPITAL_COMMUNITY): Payer: Self-pay

## 2023-01-26 ENCOUNTER — Ambulatory Visit (HOSPITAL_COMMUNITY)
Admission: RE | Admit: 2023-01-26 | Discharge: 2023-01-26 | Disposition: A | Discharge status: Home / Self Care | Payer: No Typology Code available for payment source | Source: Ambulatory Visit | Attending: Physician Assistant | Admitting: Physician Assistant

## 2023-01-26 VITALS — BP 140/64 | HR 71 | Wt 202.4 lb

## 2023-01-26 DIAGNOSIS — N184 Chronic kidney disease, stage 4 (severe): Secondary | ICD-10-CM | POA: Insufficient documentation

## 2023-01-26 DIAGNOSIS — I5022 Chronic systolic (congestive) heart failure: Secondary | ICD-10-CM | POA: Insufficient documentation

## 2023-01-26 DIAGNOSIS — K279 Peptic ulcer, site unspecified, unspecified as acute or chronic, without hemorrhage or perforation: Secondary | ICD-10-CM | POA: Insufficient documentation

## 2023-01-26 DIAGNOSIS — Z8674 Personal history of sudden cardiac arrest: Secondary | ICD-10-CM | POA: Diagnosis not present

## 2023-01-26 DIAGNOSIS — I130 Hypertensive heart and chronic kidney disease with heart failure and stage 1 through stage 4 chronic kidney disease, or unspecified chronic kidney disease: Secondary | ICD-10-CM | POA: Insufficient documentation

## 2023-01-26 DIAGNOSIS — Z8546 Personal history of malignant neoplasm of prostate: Secondary | ICD-10-CM | POA: Insufficient documentation

## 2023-01-26 DIAGNOSIS — I5082 Biventricular heart failure: Secondary | ICD-10-CM

## 2023-01-26 DIAGNOSIS — I50814 Right heart failure due to left heart failure: Secondary | ICD-10-CM | POA: Insufficient documentation

## 2023-01-26 DIAGNOSIS — D631 Anemia in chronic kidney disease: Secondary | ICD-10-CM | POA: Insufficient documentation

## 2023-01-26 DIAGNOSIS — Z79899 Other long term (current) drug therapy: Secondary | ICD-10-CM | POA: Diagnosis not present

## 2023-01-26 DIAGNOSIS — I252 Old myocardial infarction: Secondary | ICD-10-CM | POA: Insufficient documentation

## 2023-01-26 DIAGNOSIS — K219 Gastro-esophageal reflux disease without esophagitis: Secondary | ICD-10-CM | POA: Diagnosis not present

## 2023-01-26 DIAGNOSIS — E1122 Type 2 diabetes mellitus with diabetic chronic kidney disease: Secondary | ICD-10-CM | POA: Diagnosis not present

## 2023-01-26 DIAGNOSIS — I480 Paroxysmal atrial fibrillation: Secondary | ICD-10-CM | POA: Diagnosis not present

## 2023-01-26 DIAGNOSIS — I469 Cardiac arrest, cause unspecified: Secondary | ICD-10-CM

## 2023-01-26 MED ORDER — ISOSORBIDE MONONITRATE ER 30 MG PO TB24: 15.0000 mg | ORAL_TABLET | Freq: Every day | ORAL | 5 refills | Status: DC

## 2023-01-26 MED ORDER — HYDRALAZINE HCL 25 MG PO TABS: 25.0000 mg | ORAL_TABLET | Freq: Three times a day (TID) | ORAL | 5 refills | Status: AC

## 2023-01-26 NOTE — Progress Notes (Addendum)
ADVANCED HF CLINIC NOTE  Primary Care: [facility] Cardiologist: Dr. Gala Romney  HPI: 72 y.o. male with history of chronic systolic CHF, PAF, CKD IV, HTN, DM II, prostate cancer in remission, peptic ulcer disease w/ history of GI bleed. Follows with [facility] for most of his care.   Admitted 7/24 with septic vs CGS, AKI on CKD and severe metabolic acidosis. He required ICU admission. Started on abx, no LHC due to CKD, felt HsTroponin d/t demand ischemia. He went into AF with RVR, requiring IV amiodarone, with subsequent increase in pressor requirements and worsening acidosis. Unfortunately suffered PEA arrest, achieved ROSC after 10 minutes, then developed Vfib and was defibrillated >> Afib with RVR, emergently intubated, required 3 pressors. On meropenem for enterobacter cloacae bacteremia. Echo showed EF 20-25%, RV severely reduced. He was started on CRRT, converted to NSR, and eventually extubated. Pressors weaned off and ultimately required [facility] placement, and transitioned to [facility]. Repeat ltd echo showed EF improved to 40-45%, RV normal. No GDMT given improved EF, and need to avoid hypotension. He was discharged home, weight 207 lbs, with plans for outpatient AVF.  He is here today for follow-up. He reports that he no longer needs HD and his dialysis catheter was removed on 10/18. Has been feeling great. Exercising regularly and walking up to 2-3 miles every other day. He tracks his BP, pulse, weight and fluid intake daily. His weight has stabilized around 202 lb, > 220 lb prior to hospitalization. Appetite slowly improving. No dyspnea, orthopnea, PND or lower extremity edema. BP tends to average 140s systolic before and after medications. He cannot tolerate CPAP.    Cardiac Studies - Ltd Echo (7/24): EF 40-45%, moderate LVH, G1DD, normal RV  - Echo (7/24): EF 20-25%, moderate concentric LVH, severely reduce RV  Past Medical History:  Diagnosis Date   Diabetes mellitus  without complication (HCC)    GERD (gastroesophageal reflux disease)    Hypertension    Prostate cancer (HCC)    Current Outpatient Medications  Medication Sig Dispense Refill   allopurinol (ZYLOPRIM) 100 MG tablet Take 50 mg by mouth every other day. For high uric acid level     amLODipine (NORVASC) 5 MG tablet Take 1 tablet (5 mg total) by mouth daily. 90 tablet 3   apixaban (ELIQUIS) 5 MG TABS tablet Take 1 tablet (5 mg total) by mouth 2 (two) times daily. 60 tablet 3   atorvastatin (LIPITOR) 20 MG tablet Take 20 mg by mouth at bedtime. For cholesterol     Cholecalciferol (VITAMIN D3) 1000 units CAPS Take 1 capsule by mouth daily at 6 (six) AM.     SITagliptin 25 MG TABS Take 25 mg by mouth daily.     No current facility-administered medications for this visit.   Allergies  Allergen Reactions   Ferrous Sulfate Other (See Comments)   Penicillins Hives and Other (See Comments)    Has patient had a PCN reaction causing immediate rash, facial/tongue/throat swelling, SOB or lightheadedness with hypotension: no Has patient had a PCN reaction causing severe rash involving mucus membranes or skin necrosis: no Has patient had a PCN reaction that required hospitalization: yes Has patient had a PCN reaction occurring within the last 10 years: no If all of the above answers are "NO", then may proceed with Cephalosporin use.   Social History   Socioeconomic History   Marital status: Single    Spouse name: Not on file   Number of children: Not on file  Years of education: Not on file   Highest education level: Not on file  Occupational History   Not on file  Tobacco Use   Smoking status: Never    Passive exposure: Never   Smokeless tobacco: Never  Vaping Use   Vaping status: Never Used  Substance and Sexual Activity   Alcohol use: No   Drug use: No   Sexual activity: Not on file  Other Topics Concern   Not on file  Social History Narrative   Not on file   Social Determinants  of Health   Financial Resource Strain: Low Risk  (10/27/2022)   Overall Financial Resource Strain (CARDIA)    Difficulty of Paying Living Expenses: Not hard at all  Food Insecurity: No Food Insecurity (10/27/2022)   Hunger Vital Sign    Worried About Running Out of Food in the Last Year: Never true    Ran Out of Food in the Last Year: Never true  Transportation Needs: No Transportation Needs (10/27/2022)   PRAPARE - Administrator, Civil Service (Medical): No    Lack of Transportation (Non-Medical): No  Physical Activity: Not on file  Stress: Not on file  Social Connections: Not on file  Intimate Partner Violence: Not At Risk (10/27/2022)   Humiliation, Afraid, Rape, and Kick questionnaire    Fear of Current or Ex-Partner: No    Emotionally Abused: No    Physically Abused: No    Sexually Abused: No    No family history on file.  There were no vitals taken for this visit.  Wt Readings from Last 3 Encounters:  01/09/23 93 kg (205 lb)  12/29/22 91.9 kg (202 lb 9.6 oz)  12/20/22 93 kg (205 lb)   PHYSICAL EXAM: General:  Well appearing. Ambulated into clinic. HEENT: normal Neck: supple. no JVD.  Cor: PMI nondisplaced. Regular rate & rhythm. No rubs, gallops or murmurs. Lungs: clear Abdomen: soft, nontender, nondistended.  Extremities: no cyanosis, clubbing, rash, edema Neuro: alert & orientedx3. Affect pleasant   ASSESSMENT & PLAN 1. Chronic biventricular systolic CHF: - Recent admission 07/24 for shock, likely septic + possible GCS. BC + for Enterobacter cloacae. Norovirus in stool. ? Septic cardiomyopathy. - Suspect component of demand ischemia Type II NSTEMI, not candidate for cath with AKI on CKD IV. - Echo (7/24): EF 20-25%, RV severely reduced, no significant valvular disease - Repeat ltd echo (7/24) EF improved 40-45%, RV ok  - NYHA II. Now off HD. Not requiring loop diuretic. - Add hydralazine 25 mg TID + imdur 15 mg daily - Can stop amlodipine if needed  to allow BP room for GDMT - Repeat echo in 3 months   2. H/o Cardiac arrest: - 10/25/22-Became bradycardic and had PEA arrest, CPR with ROSC after 10 minutes. Then had Vfib s/p defibrillation >>Afib. In setting of profound shock and acidosis. - He does not have an ICD.   3. H/o Elevated troponin -> Type II NSTEMI - Peaked at 2,879, flat trend during recent admit. This was in setting of shock. - Suspect demand ischemia as above.  - Continue statin. Not on aspirin d/t need for anticoagulation - No chest pain.    4. Recent AKI on CKD IV - Now off HD. Dialysis catheter removed - Scr 2.58 on labs at [facility] 10/21. This is near prior baseline - Following with Nephrology at [facility]   5. Paroxysmal atrial fibrillation  - New 07/24. In setting of shock.  - NSR on ECG 11/22/22 -  Regular on exam today - Continue Eliquis, no bleeding issues - Unable to tolerate CPAP   6. DM II - A1c 7.5% in 07/24 - He is not on insulin.   7. Anemia - Renal disease likely contributory. - Hgb improved to 12.4 on 10/7    Follow up: As scheduled 11/21 with Dr. Gala Romney with echo  Anna Genre, PA-C 01/26/23

## 2023-01-26 NOTE — Patient Instructions (Addendum)
No Labs done today.   START Hydralazine 25mg  (1 tablet) bym outh 3 times daily.   START Imdur 15mg  (1/2 tablet) by mouth daily.  IF YOUR TOP BLOOD PRESSURE NUMBER DROPS BELOW 110 AND YOU FEEL LIGHTHEADED STOP AMLODIPINE.  No other medication changes were made. Please continue all current medications as prescribed.  Your physician recommends that you keep your scheduled follow-up appointment in November  If you have any questions or concerns before your next appointment please send [place] a message through [place] or call our office at [phone].    TO LEAVE A MESSAGE FOR THE NURSE SELECT OPTION 2, PLEASE LEAVE A MESSAGE INCLUDING: YOUR NAME DATE OF BIRTH CALL BACK NUMBER REASON FOR CALL**this is important as we prioritize the call backs  YOU WILL RECEIVE A CALL BACK THE SAME DAY AS LONG AS YOU CALL BEFORE 4:00 PM   Do the following things EVERYDAY: Weigh yourself in the morning before breakfast. Write it down and keep it in a log. Take your medicines as prescribed Eat low salt foods--Limit salt (sodium) to 2000 mg per day.  Stay as active as you can everyday Limit all fluids for the day to less than 2 liters   At the [facility], you and your health needs are our priority. As part of our continuing mission to provide you with exceptional heart care, we have created designated Provider Care Teams. These Care Teams include your primary Cardiologist (physician) and Advanced Practice Providers (APPs- Physician Assistants and Nurse Practitioners) who all work together to provide you with the care you need, when you need it.   You may see any of the following providers on your designated Care Team at your next follow up: Dr Arvilla Meres Dr Marca Ancona Dr. Marcos Eke, NP Robbie Lis, [place] [facility] [place], [place] Brynda Peon, NP Karle Plumber, PharmD   Please be sure to bring in all your medications bottles to every appointment.     Thank you for choosing [facility]

## 2023-01-30 ENCOUNTER — Other Ambulatory Visit (HOSPITAL_COMMUNITY): Payer: Self-pay

## 2023-02-14 MED FILL — Medication: Qty: 1 | Status: AC

## 2023-02-23 ENCOUNTER — Ambulatory Visit (HOSPITAL_COMMUNITY)
Admission: RE | Admit: 2023-02-23 | Discharge: 2023-02-23 | Disposition: A | Discharge status: Home / Self Care | Payer: No Typology Code available for payment source | Source: Ambulatory Visit | Attending: Family Medicine | Admitting: Family Medicine

## 2023-02-23 ENCOUNTER — Encounter: Payer: Self-pay | Admitting: *Deleted

## 2023-02-23 ENCOUNTER — Ambulatory Visit (HOSPITAL_BASED_OUTPATIENT_CLINIC_OR_DEPARTMENT_OTHER)
Admission: RE | Admit: 2023-02-23 | Discharge: 2023-02-23 | Disposition: A | Discharge status: Home / Self Care | Payer: No Typology Code available for payment source | Source: Ambulatory Visit | Attending: Internal Medicine | Admitting: Internal Medicine

## 2023-02-23 ENCOUNTER — Encounter (HOSPITAL_COMMUNITY): Payer: Self-pay | Admitting: Internal Medicine

## 2023-02-23 VITALS — BP 142/86 | HR 58 | Wt 206.6 lb

## 2023-02-23 DIAGNOSIS — Z8674 Personal history of sudden cardiac arrest: Secondary | ICD-10-CM | POA: Diagnosis not present

## 2023-02-23 DIAGNOSIS — I5022 Chronic systolic (congestive) heart failure: Secondary | ICD-10-CM | POA: Diagnosis not present

## 2023-02-23 DIAGNOSIS — I480 Paroxysmal atrial fibrillation: Secondary | ICD-10-CM | POA: Diagnosis not present

## 2023-02-23 DIAGNOSIS — Z79899 Other long term (current) drug therapy: Secondary | ICD-10-CM | POA: Insufficient documentation

## 2023-02-23 DIAGNOSIS — I252 Old myocardial infarction: Secondary | ICD-10-CM | POA: Insufficient documentation

## 2023-02-23 DIAGNOSIS — Z7901 Long term (current) use of anticoagulants: Secondary | ICD-10-CM | POA: Diagnosis not present

## 2023-02-23 DIAGNOSIS — E1169 Type 2 diabetes mellitus with other specified complication: Secondary | ICD-10-CM | POA: Diagnosis not present

## 2023-02-23 DIAGNOSIS — Z006 Encounter for examination for normal comparison and control in clinical research program: Secondary | ICD-10-CM

## 2023-02-23 DIAGNOSIS — B952 Enterococcus as the cause of diseases classified elsewhere: Secondary | ICD-10-CM | POA: Diagnosis not present

## 2023-02-23 DIAGNOSIS — I5082 Biventricular heart failure: Secondary | ICD-10-CM | POA: Insufficient documentation

## 2023-02-23 DIAGNOSIS — Z7984 Long term (current) use of oral hypoglycemic drugs: Secondary | ICD-10-CM | POA: Insufficient documentation

## 2023-02-23 DIAGNOSIS — N184 Chronic kidney disease, stage 4 (severe): Secondary | ICD-10-CM | POA: Diagnosis not present

## 2023-02-23 DIAGNOSIS — I2489 Other forms of acute ischemic heart disease: Secondary | ICD-10-CM | POA: Insufficient documentation

## 2023-02-23 DIAGNOSIS — E1122 Type 2 diabetes mellitus with diabetic chronic kidney disease: Secondary | ICD-10-CM | POA: Insufficient documentation

## 2023-02-23 DIAGNOSIS — Z8711 Personal history of peptic ulcer disease: Secondary | ICD-10-CM | POA: Diagnosis not present

## 2023-02-23 DIAGNOSIS — I130 Hypertensive heart and chronic kidney disease with heart failure and stage 1 through stage 4 chronic kidney disease, or unspecified chronic kidney disease: Secondary | ICD-10-CM | POA: Insufficient documentation

## 2023-02-23 DIAGNOSIS — Z8546 Personal history of malignant neoplasm of prostate: Secondary | ICD-10-CM | POA: Insufficient documentation

## 2023-02-23 DIAGNOSIS — R7989 Other specified abnormal findings of blood chemistry: Secondary | ICD-10-CM | POA: Diagnosis not present

## 2023-02-23 LAB — ECHOCARDIOGRAM COMPLETE
Area-P 1/2: 3.12 cm2
Calc EF: 42.8 %
S' Lateral: 3.8 cm
Single Plane A2C EF: 47 %
Single Plane A4C EF: 42 %

## 2023-02-23 NOTE — Progress Notes (Signed)
  Echocardiogram 2D Echocardiogram has been performed.  Delcie Roch 02/23/2023, 11:23 AM

## 2023-02-23 NOTE — Progress Notes (Addendum)
ADVANCED HF CLINIC NOTE  Primary Care: [facility] Cardiologist: Dr. Gala Romney  HPI: 72 y.o. male with history of chronic systolic CHF, PAF, CKD IV, HTN, DM II, prostate cancer in remission, peptic ulcer disease w/ history of GI bleed. Follows with [facility] for most of his care.   Admitted 7/24 with septic vs CGS, AKI on CKD and severe metabolic acidosis. He required ICU admission. Started on abx, no LHC due to CKD, felt HsTroponin d/t demand ischemia. He went into AF with RVR, requiring IV amiodarone, with subsequent increase in pressor requirements and worsening acidosis. Unfortunately suffered PEA arrest, achieved ROSC after 10 minutes, then developed Vfib and was defibrillated >> Afib with RVR, emergently intubated, required 3 pressors. On meropenem for enterobacter cloacae bacteremia. Echo showed EF 20-25%, RV severely reduced. He was started on CRRT, converted to NSR, and eventually extubated. Pressors weaned off and ultimately required [facility] placement, and transitioned to [facility]. Repeat echo showed EF improved to 40-45%, RV normal. No GDMT given improved EF, and need to avoid hypotension. He was discharged home, weight 207 lbs, with plans for outpatient AVF.  He is here today for follow-up. Off HD. Walking 2 miles every other day. No CP or SOB. No edema, orthopnea or PND. Had to cut hydralazine back due to low BP. Follow vitals daily SBP 100-110  Follows with Nephrology at [facility]  Echo today 02/23/23 EF 45-50%   Cardiac Studies - Ltd Echo (7/24): EF 40-45%, moderate LVH, G1DD, normal RV  - Echo (7/24): EF 20-25%, moderate concentric LVH, severely reduce RV  Past Medical History:  Diagnosis Date   Diabetes mellitus without complication (HCC)    GERD (gastroesophageal reflux disease)    Hypertension    Prostate cancer (HCC)    Current Outpatient Medications  Medication Sig Dispense Refill   allopurinol (ZYLOPRIM) 100 MG tablet Take 50 mg by mouth  every other day. For high uric acid level     amLODipine (NORVASC) 5 MG tablet Take 1 tablet (5 mg total) by mouth daily. 90 tablet 3   apixaban (ELIQUIS) 5 MG TABS tablet Take 1 tablet (5 mg total) by mouth 2 (two) times daily. 60 tablet 3   atorvastatin (LIPITOR) 20 MG tablet Take 20 mg by mouth at bedtime. For cholesterol     isosorbide mononitrate (IMDUR) 30 MG 24 hr tablet Take 0.5 tablets (15 mg total) by mouth daily. 30 tablet 5   SITagliptin 25 MG TABS Take 25 mg by mouth daily.     hydrALAZINE (APRESOLINE) 25 MG tablet Take 1 tablet (25 mg total) by mouth 3 (three) times daily. (Patient not taking: Reported on 02/23/2023) 90 tablet 5   No current facility-administered medications for this encounter.   Allergies  Allergen Reactions   Ferrous Sulfate Other (See Comments)   Penicillins Hives and Other (See Comments)    Has patient had a PCN reaction causing immediate rash, facial/tongue/throat swelling, SOB or lightheadedness with hypotension: no Has patient had a PCN reaction causing severe rash involving mucus membranes or skin necrosis: no Has patient had a PCN reaction that required hospitalization: yes Has patient had a PCN reaction occurring within the last 10 years: no If all of the above answers are "NO", then may proceed with Cephalosporin use.   Social History   Socioeconomic History   Marital status: Single    Spouse name: Not on file   Number of children: Not on file   Years of education: Not on  file   Highest education level: Not on file  Occupational History   Not on file  Tobacco Use   Smoking status: Never    Passive exposure: Never   Smokeless tobacco: Never  Vaping Use   Vaping status: Never Used  Substance and Sexual Activity   Alcohol use: No   Drug use: No   Sexual activity: Not on file  Other Topics Concern   Not on file  Social History Narrative   Not on file   Social Determinants of Health   Financial Resource Strain: Low Risk  (10/27/2022)    Overall Financial Resource Strain (CARDIA)    Difficulty of Paying Living Expenses: Not hard at all  Food Insecurity: No Food Insecurity (10/27/2022)   Hunger Vital Sign    Worried About Running Out of Food in the Last Year: Never true    Ran Out of Food in the Last Year: Never true  Transportation Needs: No Transportation Needs (10/27/2022)   PRAPARE - Administrator, Civil Service (Medical): No    Lack of Transportation (Non-Medical): No  Physical Activity: Not on file  Stress: Not on file  Social Connections: Not on file  Intimate Partner Violence: Not At Risk (10/27/2022)   Humiliation, Afraid, Rape, and Kick questionnaire    Fear of Current or Ex-Partner: No    Emotionally Abused: No    Physically Abused: No    Sexually Abused: No    No family history on file.  BP (!) 142/86   Pulse (!) 58   Wt 93.7 kg (206 lb 9.6 oz)   SpO2 100%   BMI 32.36 kg/m   Wt Readings from Last 3 Encounters:  02/23/23 93.7 kg (206 lb 9.6 oz)  01/26/23 91.8 kg (202 lb 6.4 oz)  01/09/23 93 kg (205 lb)   PHYSICAL EXAM: General:  Well appearing. No resp difficulty HEENT: normal Neck: supple. no JVD. Carotids 2+ bilat; no bruits. No lymphadenopathy or thryomegaly appreciated. Cor: PMI nondisplaced. Regular rate & rhythm. No rubs, gallops or murmurs. Lungs: clear Abdomen: soft, nontender, nondistended. No hepatosplenomegaly. No bruits or masses. Good bowel sounds. Extremities: no cyanosis, clubbing, rash, edema Neuro: alert & orientedx3, cranial nerves grossly intact. moves all 4 extremities w/o difficulty. Affect pleasant    ASSESSMENT & PLAN 1. Chronic biventricular systolic CHF: - Recent admission 07/24 for shock, likely septic + possible GCS. BC + for Enterobacter cloacae. Norovirus in stool. ? Septic cardiomyopathy. - Suspect component of demand ischemia Type II NSTEMI, not candidate for cath with AKI on CKD IV. - Echo (7/24): EF 20-25%, RV severely reduced, no significant  valvular disease - Limited echo (7/24) EF improved 40-45%, RV ok  - NYHA II. Now off HD. Volume ok. Not on loop diuretics - Continue hydralazine 25 mg TID + imdur 15 mg daily - Can stop amlodipine if needed to allow BP room for GDMT - BP high here but low at home so no room to titrate GDMT - Not cath candidate with CKD IV. Check Lexiscan Myoview to look for ischemic etiology   2. H/o Cardiac arrest: - 10/25/22-Became bradycardic and had PEA arrest, CPR with ROSC after 10 minutes. Then had Vfib s/p defibrillation >>Afib. In setting of profound shock and acidosis. - He does not have an ICD.   3. H/o Elevated troponin -> Type II NSTEMI - Peaked at 2,879, flat trend during recent admit. This was in setting of shock. - Suspect demand ischemia as above.  - Continue  statin. Not on aspirin d/t need for anticoagulation - No chest pain.    4. Recent AKI on CKD IV - Now off HD. Dialysis catheter removed - Scr 2.58 on labs at [facility] 10/21. This is near prior baseline - LastScr 2.33  - Following with Nephrology at [facility]   5. Paroxysmal atrial fibrillation  - New 07/24. In setting of shock.  - NSR on ECG 11/22/22 - Remains in NSR - Continue Eliquis - Unable to tolerate CPAP   6. DM II - A1c 7.5% in 07/24 - Follows with PCP  Arvilla Meres, MD  12:00 PM

## 2023-02-23 NOTE — Research (Signed)
SITE: [id number]     Subject #[id number]    Subprotocol: A  Inclusion Criteria  Patients who meet all of the following criteria are eligible for enrollment as study participants:  Yes No  Age > 74 years old X   Eligible to wear Holter Study X    Exclusion Criteria  Patients who meet any of these criteria are not eligible for enrollment as study participants: Yes No  1. Receiving any mechanical (respiratory or circulatory) or renal support therapy at Screening or during Visit #1.  X  2.  Any other conditions that in the opinion of the investigators are likely to prevent compliance with the study protocol or pose a safety concern if the subject participates in the study.  X  3. Poor tolerance, namely susceptible to severe skin allergies from ECG adhesive patch application.  X   Protocol: REV H                                     Residential Zip code*  [address] (First 3 digits ONLY)                                             [facility] Informed Consent   Subject Name: Troy Johnston  Subject met inclusion and exclusion criteria.  The informed consent form, study requirements and expectations were reviewed with the subject. Subject had opportunity to read consent and questions and concerns were addressed prior to the signing of the consent form.  The subject verbalized understanding of the trial requirements.  The subject agreed to participate in the EF-ACT trial and signed the informed consent at 02/23/2023 on 1130.  The informed consent was obtained prior to performance of any protocol-specific procedures for the subject.  A copy of the signed informed consent was given to the subject and a copy was placed in the subject's medical record.   Brunilda Payor

## 2023-02-23 NOTE — Patient Instructions (Signed)
Medication Changes:  ,No Changes In Medications at this time.   Lab Work:  None Ordered At This Time.   Testing/Procedures:  Your physician has requested that you have a lexiscan myoview. For further information please visit [email or web address] Please follow instruction sheet, as given. SOMEONE WILL CALL YOU TO GET THIS SCHEDULED    The test will take approximately 3 to 4 hours to complete; you may bring reading material.  If someone comes with you to your appointment, they will need to remain in the main lobby due to limited space in the testing area.    How to prepare for your Myocardial Perfusion Test: Do not eat or drink 3 hours prior to your test, except you may have water. Do not consume products containing caffeine (regular or decaffeinated) 12 hours prior to your test. (ex: coffee, chocolate, sodas, tea). Do wear comfortable clothes (no dresses or overalls) and walking shoes, tennis shoes preferred (No heels or open toe shoes are allowed). Do NOT wear cologne, perfume, aftershave, or lotions (deodorant is allowed). If you use an inhaler, use it the AM of your test and bring it with you.  If you use a nebulizer, use it the AM of your test.  If these instructions are not followed, your test will have to be rescheduled.   Follow-Up in: 9 MONTHS PLEASE CALL OUR OFFICE AROUND JUNE 2025 TO GET SCHEDULED FOR YOUR APPOINTMENT. PHONE NUMBER IS [phone] OPTION 2    At the [facility], you and your health needs are our priority. We have a designated team specialized in the treatment of Heart Failure. This Care Team includes your primary Heart Failure Specialized Cardiologist (physician), Advanced Practice Providers (APPs- Physician Assistants and Nurse Practitioners), and Pharmacist who all work together to provide you with the care you need, when you need it.   You may see any of the following providers on your designated Care Team at your next follow up:  Dr.  Arvilla Meres Dr. Marca Ancona Dr. Dorthula Nettles Dr. Theresia Bough Tonye Becket, NP Robbie Lis, [place] [facility] [place], [place] Brynda Peon, NP [place] Lee, NP Karle Plumber, PharmD   Please be sure to bring in all your medications bottles to every appointment.   Need to Contact us:  If you have any questions or concerns before your next appointment please send [place] a message through [place] or call our office at [phone].    TO LEAVE A MESSAGE FOR THE NURSE SELECT OPTION 2, PLEASE LEAVE A MESSAGE INCLUDING: YOUR NAME DATE OF BIRTH CALL BACK NUMBER REASON FOR CALL**this is important as we prioritize the call backs  YOU WILL RECEIVE A CALL BACK THE SAME DAY AS LONG AS YOU CALL BEFORE 4:00 PM

## 2023-02-27 ENCOUNTER — Encounter (HOSPITAL_COMMUNITY): Payer: Self-pay | Admitting: Internal Medicine

## 2023-02-27 ENCOUNTER — Telehealth (HOSPITAL_COMMUNITY): Payer: Self-pay

## 2023-02-27 NOTE — Telephone Encounter (Signed)
Spoke with the patient, detailed instructions given. He stated that he would be here for his test. Asked to call back with any questions. CCT

## 2023-03-07 ENCOUNTER — Ambulatory Visit (HOSPITAL_COMMUNITY): Payer: 59 | Attending: Cardiology

## 2023-03-07 DIAGNOSIS — I5022 Chronic systolic (congestive) heart failure: Secondary | ICD-10-CM | POA: Insufficient documentation

## 2023-03-07 LAB — MYOCARDIAL PERFUSION IMAGING
LV dias vol: 130 mL (ref 62–150)
LV sys vol: 71 mL
Nuc Stress EF: 45 %
Peak HR: 84 {beats}/min
Rest HR: 60 {beats}/min
Rest Nuclear Isotope Dose: 10.1 mCi
SDS: 0
SRS: 0
SSS: 0
ST Depression (mm): 0 mm
Stress Nuclear Isotope Dose: 31.3 mCi
TID: 1.06

## 2023-03-07 MED ORDER — REGADENOSON 0.4 MG/5ML IV SOLN
0.4000 mg | Freq: Once | INTRAVENOUS | Status: AC
Start: 2023-03-07 — End: 2023-03-07
  Administered 2023-03-07: 0.4 mg via INTRAVENOUS

## 2023-03-07 MED ORDER — TECHNETIUM TC 99M TETROFOSMIN IV KIT
31.3000 | PACK | Freq: Once | INTRAVENOUS | Status: AC | PRN
  Administered 2023-03-07: 31.3 via INTRAVENOUS

## 2023-03-07 MED ORDER — TECHNETIUM TC 99M TETROFOSMIN IV KIT
10.1000 | PACK | Freq: Once | INTRAVENOUS | Status: AC | PRN
  Administered 2023-03-07: 10.1 via INTRAVENOUS

## 2023-04-18 ENCOUNTER — Encounter: Payer: Self-pay | Admitting: Internal Medicine

## 2023-04-18 ENCOUNTER — Ambulatory Visit: Payer: No Typology Code available for payment source | Attending: Internal Medicine | Admitting: Internal Medicine

## 2023-04-18 VITALS — BP 142/72 | HR 78 | Ht 67.0 in | Wt 213.0 lb

## 2023-04-18 DIAGNOSIS — I5022 Chronic systolic (congestive) heart failure: Secondary | ICD-10-CM | POA: Diagnosis not present

## 2023-04-18 DIAGNOSIS — I469 Cardiac arrest, cause unspecified: Secondary | ICD-10-CM | POA: Diagnosis not present

## 2023-04-18 NOTE — Progress Notes (Signed)
 Cardiology Office Note:  .   Date:  04/18/2023  ID:  Troy Johnston Mam, DOB 23-Jun-1950, MRN [id number] PCP: [facility], [facility]  [facility] Providers Cardiologist:  Alvan Ronal FORBES, MD    History of Present Illness: .   Troy Johnston is a 73 y.o. male HFmREF, PAF, CKD IV, HTN, DM II, paroxysmal atrial fibrillation, prostate cancer in remission, peptic ulcer disease w/ history of GI bleed follows at the [facility] in [place]. See Dr. Nelle notes in November for full hx. In short he was admitted in July with possible sepsis, AKI on CKD stage IV. He had afib with rvr managed with amiodarone . He also suffered a PEA arrest->Vfib and was defibrillated.  This was in the setting of shock and significant acidosis.  Therefore there is no indication for ICD.  His EF was 20 to 25% and he had RV failure.  With his renal failure he was started on CRRT.  He also required pressor support.  He was eventually weaned off of pressors and transition to IHD.  He had a repeat echocardiogram that showed improvement in his EF to 40 to 45% and his RV recovered to normal function. He has had subsequent ischemic evaluation with a SPECT (cath was avoided 2/2 renal fxn) and this was normal. He is off dialysis. Crt 2.3.  He follows with nephrology at the [facility]. No family hx of cardiac dx.   He is here today for follow-up.  I am assuming there is no need to continue with heart failure considering his EF recovery.  He has had no issues.  No signs of heart failure.  ROS:  per HPI otherwise negative   Studies Reviewed: .        Per above Risk Assessment/Calculations:    CHA2DS2-VASc Score = 4   This indicates a 4.8% annual risk of stroke. The patient's score is based upon: CHF History: 1 HTN History: 0 Diabetes History: 1 Stroke History: 0 Vascular Disease History: 1 Age Score: 1 Gender Score: 0         Physical Exam:   VS:  BP (!) 142/72   Pulse 78   Ht 5' 7 (1.702 m)   Wt 213 lb (96.6 kg)    BMI 33.36 kg/m    Wt Readings from Last 3 Encounters:  04/18/23 213 lb (96.6 kg)  02/23/23 206 lb 9.6 oz (93.7 kg)  01/26/23 202 lb 6.4 oz (91.8 kg)    GEN: Well nourished, well developed in no acute distress NECK: No JVD; No carotid bruits CARDIAC: RRR, no murmurs, rubs, gallops RESPIRATORY:  Clear to auscultation without rales, wheezing or rhonchi  ABDOMEN: Soft, non-tender, non-distended EXTREMITIES:  No edema; No deformity   ASSESSMENT AND PLAN: .   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  His EF has recovered.  No signs of an ischemic cause on his stress.  No indication for an ICD.  He asked what of the signs of heart failure and we discussed what the signs were and to let us  know if they occur.  Otherwise, will continue current therapy  HTN Good control at home 137-140 before he takes his meds.  He can continue current therapy.  Paroxysmal atrial fibrillation Developed in the setting of sepsis. He has been in sinus rhythm.  Recommend continuing Eliquis .  Current Outpatient Medications on File Prior to Visit  Medication Sig Dispense Refill   allopurinol (ZYLOPRIM) 100 MG tablet Take 50 mg by mouth every  other day. For high uric acid level     amLODipine  (NORVASC ) 5 MG tablet Take 1 tablet (5 mg total) by mouth daily. 90 tablet 3   apixaban  (ELIQUIS ) 5 MG TABS tablet Take 1 tablet (5 mg total) by mouth 2 (two) times daily. 60 tablet 3   atorvastatin  (LIPITOR) 20 MG tablet Take 20 mg by mouth at bedtime. For cholesterol     hydrALAZINE  (APRESOLINE ) 25 MG tablet Take 1 tablet (25 mg total) by mouth 3 (three) times daily. 90 tablet 5   isosorbide  mononitrate (IMDUR ) 30 MG 24 hr tablet Take 0.5 tablets (15 mg total) by mouth daily. 30 tablet 5   SITagliptin 25 MG TABS Take 25 mg by mouth daily.     No current facility-administered medications on file prior to visit.           Dispo: Follow up with an APP in 6 months  Signed, Erian Lariviere, Ronal BRAVO, MD

## 2023-04-18 NOTE — Patient Instructions (Signed)
 Medication Instructions:  Your physician recommends that you continue on your current medications as directed. Please refer to the Current Medication list given to you today.  *If you need a refill on your cardiac medications before your next appointment, please call your pharmacy*   Lab Work: NONE ordered at this time of appointment   Testing/Procedures: NONE ordered at this time of appointment   Follow-Up: At [facility], you and your health needs are our priority.  As part of our continuing mission to provide you with exceptional heart care, we have created designated Provider Care Teams.  These Care Teams include your primary Cardiologist (physician) and Advanced Practice Providers (APPs -  Physician Assistants and Nurse Practitioners) who all work together to provide you with the care you need, when you need it.  We recommend signing up for the patient portal called MyChart.  Sign up information is provided on this After Visit Summary.  MyChart is used to connect with patients for Virtual Visits (Telemedicine).  Patients are able to view lab/test results, encounter notes, upcoming appointments, etc.  Non-urgent messages can be sent to your provider as well.   To learn more about what you can do with MyChart, go to [email or web address].    Your next appointment:   6 month(s)  Provider:   Any app

## 2023-06-16 NOTE — Addendum Note (Signed)
 Encounter addended by: Howell Rucks, RDCS on: 06/16/2023 12:42 PM  Actions taken: Imaging Exam ended

## 2023-09-06 ENCOUNTER — Encounter: Payer: Self-pay | Admitting: Nurse Practitioner

## 2023-09-07 ENCOUNTER — Encounter: Payer: Self-pay | Admitting: Neurology

## 2023-09-07 ENCOUNTER — Ambulatory Visit: Payer: 59 | Admitting: Neurology

## 2023-09-07 VITALS — BP 147/74 | HR 65 | Ht 67.0 in | Wt 216.0 lb

## 2023-09-07 DIAGNOSIS — Z8674 Personal history of sudden cardiac arrest: Secondary | ICD-10-CM

## 2023-09-07 DIAGNOSIS — Z8669 Personal history of other diseases of the nervous system and sense organs: Secondary | ICD-10-CM

## 2023-09-07 DIAGNOSIS — Z8619 Personal history of other infectious and parasitic diseases: Secondary | ICD-10-CM

## 2023-09-07 DIAGNOSIS — Z9289 Personal history of other medical treatment: Secondary | ICD-10-CM | POA: Diagnosis not present

## 2023-09-07 DIAGNOSIS — R251 Tremor, unspecified: Secondary | ICD-10-CM

## 2023-09-07 NOTE — Patient Instructions (Signed)
 You have a history of a hand tremor but thankfully you feel improved.  I did not see any significant tremor on examination today.  In particular, I do not see any signs or symptoms of parkinson's like disease or what we call parkinsonism.   For your tremor, I would not recommend any new medications at this time.   We do not need to pursue any additional testing from my end of things or a follow-up appointment on a regular basis in this clinic.  Please do follow-up with your primary care at the [facility] and your other specialists on a regular basis as scheduled/planned.  Please remember, that any kind of tremor may be exacerbated by anxiety, anger, nervousness, excitement, dehydration, sleep deprivation, thyroid dysfunction, by caffeine, and low blood sugar values or blood sugar fluctuations. Some medications can exacerbate tremors, this includes certain asthma or COPD medications and certain antidepressants.

## 2023-09-07 NOTE — Progress Notes (Signed)
 Subjective:    Patient ID: Troy Johnston is a 73 y.o. male.  HPI    Debbra Fairy, MD, PhD [facility] [address] [address] [place], [place] [address]   I saw patient, Troy Johnston, as a referral from the [facility] for evaluation of his hand tremors.  The patient is unaccompanied today.  Mr. Tierce is a 73 year old gentleman with an underlying complex medical history of diabetes, hypertension, prostate cancer, chronic kidney disease, congestive heart failure, paroxysmal A-fib, history of septic shock with admission to the hospital in July 2024, complicated by cardiac arrest, shock liver, end-stage renal disease, requiring hemodialysis for 2 months, prior history of sleep apnea and obesity, who reports an intermittent hand tremor for the past several months, he noticed it when people improve his attention to his hand tremor, it affected both hands but is now better.  He attributes the tremor to his recent stress and feels that he is not affected by the tremor any longer.  [facility]  I reviewed his hospital records from his admission in July through August 2024 in his electronic chart.  He had a complicated hospital stay including ICU admission and was altogether in the hospital for about 2 weeks.  He had a protracted rehab course and started using a walker and transition to a cane.  He currently does not typically use a cane but brought his 4-prong cane today for safety as it was raining outside and his balance is not always good.  He has not fallen recently.  He denies any sudden onset one-sided weakness or numbness or tingling or droopy face or slurring of speech.  He does not drink any caffeine, he does not drink any alcohol.  He is a non-smoker.  He tries to hydrate well.  He is followed by multiple specialists including nephrology and cardiology.   His Past Medical History Is Significant For: Past Medical History:  Diagnosis Date    Diabetes mellitus without complication (HCC)    GERD (gastroesophageal reflux disease)    Hypertension    Prostate cancer (HCC)     His Past Surgical History Is Significant For: Past Surgical History:  Procedure Laterality Date   IR FLUORO GUIDE CV LINE RIGHT  11/04/2022   IR US  GUIDE VASC ACCESS RIGHT  11/04/2022    His Family History Is Significant For: Family History  Problem Relation Age of Onset   Parkinson's disease Neg Hx    Tremor Neg Hx     His Social History Is Significant For: Social History   Socioeconomic History   Marital status: Single    Spouse name: Not on file   Number of children: Not on file   Years of education: Not on file   Highest education level: Not on file  Occupational History   Not on file  Tobacco Use   Smoking status: Never    Passive exposure: Never   Smokeless tobacco: Never  Vaping Use   Vaping status: Never Used  Substance and Sexual Activity   Alcohol use: No   Drug use: No   Sexual activity: Not on file  Other Topics Concern   Not on file  Social History Narrative   Pt lives with family    Retired   Social Drivers of Corporate investment banker Strain: Low Risk  (10/27/2022)   Overall Financial Resource Strain (CARDIA)    Difficulty of Paying Living Expenses: Not hard at all  Food Insecurity: No Food Insecurity (10/27/2022)   Hunger Vital Sign    Worried About Running Out of Food in the Last Year: Never true    Ran Out of Food in the Last Year: Never true  Transportation Needs: No Transportation Needs (10/27/2022)   PRAPARE - Administrator, Civil Service (Medical): No    Lack of Transportation (Non-Medical): No  Physical Activity: Not on file  Stress: Not on file  Social Connections: Not on file    His Allergies Are:  Allergies  Allergen Reactions   Ferrous Sulfate Other (See Comments)   Penicillins Hives and Other (See Comments)    Has patient had a PCN reaction causing immediate rash,  facial/tongue/throat swelling, SOB or lightheadedness with hypotension: no Has patient had a PCN reaction causing severe rash involving mucus membranes or skin necrosis: no Has patient had a PCN reaction that required hospitalization: yes Has patient had a PCN reaction occurring within the last 10 years: no If all of the above answers are "NO", then may proceed with Cephalosporin use.  :   His Current Medications Are:  Outpatient Encounter Medications as of 09/07/2023  Medication Sig   allopurinol (ZYLOPRIM) 100 MG tablet Take 50 mg by mouth every other day. For high uric acid level   amLODipine  (NORVASC ) 5 MG tablet Take 1 tablet (5 mg total) by mouth daily.   apixaban  (ELIQUIS ) 5 MG TABS tablet Take 1 tablet (5 mg total) by mouth 2 (two) times daily.   atorvastatin  (LIPITOR) 20 MG tablet Take 20 mg by mouth at bedtime. For cholesterol   isosorbide  mononitrate (IMDUR ) 30 MG 24 hr tablet Take 0.5 tablets (15 mg total) by mouth daily.   SITagliptin 25 MG TABS Take 25 mg by mouth daily.   hydrALAZINE  (APRESOLINE ) 25 MG tablet Take 1 tablet (25 mg total) by mouth 3 (three) times daily.   No facility-administered encounter medications on file as of 09/07/2023.  :   Review of Systems:  Out of a complete 14 point review of systems, all are reviewed and negative with the exception of these symptoms as listed below:   Review of Systems  Neurological:        Pt here for tremors Pt states tremors in both hands Pt states that was six months ago Pt states tremors are better . Pt states thinks his blood sugar was to low Pt states July 2024  was in the hospital for 30 days .    Objective:  Neurological Exam  Physical Exam Physical Examination:   Vitals:   09/07/23 1444  BP: (!) 147/74  Pulse: 65    General Examination: The patient is a very pleasant 73 y.o. male in no acute distress. He appears well-developed and well-nourished and well groomed.   HEENT: Normocephalic, atraumatic, pupils  are equal, round and reactive to light, corrective eyeglasses in place.  Extraocular tracking is good without limitation to gaze excursion or nystagmus noted. Hearing is grossly intact. Face is symmetric with normal facial animation and normal facial sensation to light touch, temperature and vibration sense. Speech is clear with no dysarthria noted. There is no hypophonia. There is no lip, neck/head, jaw or voice tremor. Neck is supple with full range of passive and active motion. There are no carotid bruits on auscultation. Oropharynx exam reveals: mild mouth dryness, adequate dental hygiene.  No involuntary mouth or tongue movements.  Chest: Clear to auscultation without wheezing, rhonchi or crackles noted.  Heart: S1+S2+0, regular and  normal without murmurs, rubs or gallops noted.   Abdomen: Soft, non-tender and non-distended.  Extremities: There is no pitting edema in the distal lower extremities bilaterally.   Skin: Warm and dry without trophic changes noted.   Musculoskeletal: exam reveals left ankle fused from a remote injury and surgery years ago.    Neurologically:  Mental status: The patient is awake, alert and oriented in all 4 spheres. His immediate and remote memory, attention, language skills and fund of knowledge are appropriate. There is no evidence of aphasia, agnosia, apraxia or anomia. Speech is clear with normal prosody and enunciation. Thought process is linear. Mood is normal and affect is normal.  Cranial nerves II - XII are as described above under HEENT exam.  Motor exam: Normal bulk, strength and tone is noted. There is no obvious action or resting tremor.  No drift or rebound, no significant postural tremor or intention tremor. On 09/07/2023: On Archimedes spiral drawing he has mild trembling with the left hand, minimal trembling with the right hand, handwriting with the right hand is legible, on the smaller side, mildly tremulous.  Fine motor skills and coordination:  Intact finger taps and hand movements in the upper extremities, intact foot tap on the right, limited range of motion left ankle.    Cerebellar testing: No dysmetria or intention tremor. There is no truncal or gait ataxia.  Normal finger-to-nose, normal heel-to-shin bilaterally Sensory exam: intact to light touch, temperature and vibration sense in the upper and lower extremities.  Reflexes are trace in the upper extremities and diminished in the lower extremities, toes are downgoing bilaterally. Gait, station and balance: He stands without difficulty, does not require assistance.  He brought his 4-prong cane but does not need to use it for shorter distance and walks without shuffling, preserved arm swing, posture age-appropriate.    Assessment and Plan:  In summary, SAATVIK THIELMAN is a very pleasant 73 year old gentleman with an underlying complex medical history of diabetes, hypertension, prostate cancer, chronic kidney disease, congestive heart failure, paroxysmal A-fib, history of septic shock with admission to the hospital in July 2024, complicated by cardiac arrest, shock liver, end-stage renal disease, requiring hemodialysis for 2 months, prior history of sleep apnea and obesity, who presents for evaluation of his hand tremors of several months duration with recent improvement noted.  History and examination are not in keeping with parkinsonism or essential tremor.  Examination as far is his tremor is benign today.  In fact, he feels that he no longer has a tremor, it was never really bothersome to him but was noticed by friends and family as he indicates.  He is advised regarding common tremor triggers.  His tremor was most likely tied in with his serious medical illness and tremors can be seen with impaired liver function, impaired kidney function, stress, blood sugar fluctuation, sleep deprivation and medication side effects to name a few causes.  He is largely reassured today.  He is advised to  follow-up with his primary care at the [facility] and his specialists.  We do not need to pursue any additional testing from my end of things at this time and do not need to follow him on a regular basis in this clinic.  I answered all his questions today and he was in agreement with our plan.  Of note, he reports a prior diagnosis of obstructive sleep apnea many years ago but has not been on PAP therapy.  He is encouraged to talk to his [place]  provider about getting reevaluated for sleep apnea in light of his complex medical history particularly his cardiac history and congestive heart failure as well as A-fib diagnosis.  He was in agreement.  This was an extended visit of over 60 minutes with copious electronic and also paper record review involved as well as considerable counseling and coordination of care.

## 2023-09-19 ENCOUNTER — Other Ambulatory Visit (HOSPITAL_COMMUNITY): Payer: Self-pay

## 2024-01-31 ENCOUNTER — Other Ambulatory Visit (HOSPITAL_COMMUNITY): Payer: Self-pay | Admitting: Physician Assistant

## 2024-04-17 ENCOUNTER — Other Ambulatory Visit (HOSPITAL_COMMUNITY): Payer: Self-pay | Admitting: Internal Medicine

## 2024-05-23 ENCOUNTER — Ambulatory Visit (HOSPITAL_COMMUNITY): Admitting: Internal Medicine
